# Patient Record
Sex: Female | Born: 1945 | Race: White | Hispanic: No | Marital: Married | Smoking: Never smoker
Health system: Southern US, Community
[De-identification: ages and names within clinical notes are randomized; demographics above are authoritative.]

## PROBLEM LIST (undated history)

## (undated) DIAGNOSIS — Z5189 Encounter for other specified aftercare: Secondary | ICD-10-CM

## (undated) DIAGNOSIS — F419 Anxiety disorder, unspecified: Secondary | ICD-10-CM

## (undated) DIAGNOSIS — R7302 Impaired glucose tolerance (oral): Secondary | ICD-10-CM

## (undated) DIAGNOSIS — I10 Essential (primary) hypertension: Secondary | ICD-10-CM

## (undated) DIAGNOSIS — M8580 Other specified disorders of bone density and structure, unspecified site: Secondary | ICD-10-CM

## (undated) DIAGNOSIS — M25461 Effusion, right knee: Secondary | ICD-10-CM

## (undated) DIAGNOSIS — S83209A Unspecified tear of unspecified meniscus, current injury, unspecified knee, initial encounter: Secondary | ICD-10-CM

## (undated) DIAGNOSIS — F32A Depression, unspecified: Secondary | ICD-10-CM

## (undated) DIAGNOSIS — T7840XA Allergy, unspecified, initial encounter: Secondary | ICD-10-CM

## (undated) DIAGNOSIS — Z87898 Personal history of other specified conditions: Secondary | ICD-10-CM

## (undated) DIAGNOSIS — D126 Benign neoplasm of colon, unspecified: Secondary | ICD-10-CM

## (undated) DIAGNOSIS — IMO0002 Reserved for concepts with insufficient information to code with codable children: Secondary | ICD-10-CM

## (undated) DIAGNOSIS — Z8719 Personal history of other diseases of the digestive system: Secondary | ICD-10-CM

## (undated) DIAGNOSIS — M1990 Unspecified osteoarthritis, unspecified site: Secondary | ICD-10-CM

## (undated) DIAGNOSIS — K219 Gastro-esophageal reflux disease without esophagitis: Secondary | ICD-10-CM

## (undated) DIAGNOSIS — E785 Hyperlipidemia, unspecified: Secondary | ICD-10-CM

## (undated) DIAGNOSIS — E119 Type 2 diabetes mellitus without complications: Secondary | ICD-10-CM

## (undated) DIAGNOSIS — M810 Age-related osteoporosis without current pathological fracture: Secondary | ICD-10-CM

## (undated) DIAGNOSIS — K760 Fatty (change of) liver, not elsewhere classified: Secondary | ICD-10-CM

## (undated) DIAGNOSIS — M19079 Primary osteoarthritis, unspecified ankle and foot: Secondary | ICD-10-CM

## (undated) DIAGNOSIS — M5030 Other cervical disc degeneration, unspecified cervical region: Secondary | ICD-10-CM

## (undated) DIAGNOSIS — F329 Major depressive disorder, single episode, unspecified: Secondary | ICD-10-CM

## (undated) DIAGNOSIS — K589 Irritable bowel syndrome without diarrhea: Secondary | ICD-10-CM

## (undated) DIAGNOSIS — D649 Anemia, unspecified: Secondary | ICD-10-CM

## (undated) HISTORY — DX: Other specified disorders of bone density and structure, unspecified site: M85.80

## (undated) HISTORY — DX: Reserved for concepts with insufficient information to code with codable children: IMO0002

## (undated) HISTORY — DX: Impaired glucose tolerance (oral): R73.02

## (undated) HISTORY — DX: Encounter for other specified aftercare: Z51.89

## (undated) HISTORY — PX: HYSTEROSCOPY WITH D & C: SHX1775

## (undated) HISTORY — PX: COLONOSCOPY: SHX5424

## (undated) HISTORY — DX: Depression, unspecified: F32.A

## (undated) HISTORY — DX: Age-related osteoporosis without current pathological fracture: M81.0

## (undated) HISTORY — DX: Hyperlipidemia, unspecified: E78.5

## (undated) HISTORY — DX: Allergy, unspecified, initial encounter: T78.40XA

## (undated) HISTORY — DX: Major depressive disorder, single episode, unspecified: F32.9

## (undated) HISTORY — DX: Essential (primary) hypertension: I10

## (undated) HISTORY — PX: OTHER SURGICAL HISTORY: SHX169

## (undated) HISTORY — DX: Benign neoplasm of colon, unspecified: D12.6

## (undated) HISTORY — DX: Gastro-esophageal reflux disease without esophagitis: K21.9

## (undated) HISTORY — DX: Anemia, unspecified: D64.9

## (undated) HISTORY — DX: Unspecified osteoarthritis, unspecified site: M19.90

---

## 1966-10-11 DIAGNOSIS — Z5189 Encounter for other specified aftercare: Secondary | ICD-10-CM

## 1966-10-11 HISTORY — DX: Encounter for other specified aftercare: Z51.89

## 1986-10-11 HISTORY — PX: TUBAL LIGATION: SHX77

## 1996-12-04 ENCOUNTER — Encounter: Payer: Self-pay | Admitting: Gastroenterology

## 1998-09-25 ENCOUNTER — Other Ambulatory Visit: Admission: RE | Admit: 1998-09-25 | Discharge: 1998-09-25 | Payer: Self-pay | Admitting: Gynecology

## 1999-01-29 ENCOUNTER — Other Ambulatory Visit: Admission: RE | Admit: 1999-01-29 | Discharge: 1999-01-29 | Payer: Self-pay | Admitting: Gynecology

## 2000-02-04 ENCOUNTER — Other Ambulatory Visit: Admission: RE | Admit: 2000-02-04 | Discharge: 2000-02-04 | Payer: Self-pay | Admitting: Gynecology

## 2001-02-16 ENCOUNTER — Other Ambulatory Visit: Admission: RE | Admit: 2001-02-16 | Discharge: 2001-02-16 | Payer: Self-pay | Admitting: Gynecology

## 2001-10-31 ENCOUNTER — Encounter: Admission: RE | Admit: 2001-10-31 | Discharge: 2001-11-17 | Payer: Self-pay | Admitting: Family Medicine

## 2002-02-21 ENCOUNTER — Other Ambulatory Visit: Admission: RE | Admit: 2002-02-21 | Discharge: 2002-02-21 | Payer: Self-pay | Admitting: Family Medicine

## 2002-12-13 ENCOUNTER — Other Ambulatory Visit: Admission: RE | Admit: 2002-12-13 | Discharge: 2002-12-13 | Payer: Self-pay | Admitting: Obstetrics and Gynecology

## 2003-02-22 ENCOUNTER — Encounter: Payer: Self-pay | Admitting: Family Medicine

## 2003-02-22 ENCOUNTER — Ambulatory Visit (HOSPITAL_COMMUNITY): Admission: RE | Admit: 2003-02-22 | Discharge: 2003-02-22 | Payer: Self-pay | Admitting: Family Medicine

## 2003-12-03 ENCOUNTER — Other Ambulatory Visit: Admission: RE | Admit: 2003-12-03 | Discharge: 2003-12-03 | Payer: Self-pay | Admitting: Gynecology

## 2004-12-17 ENCOUNTER — Other Ambulatory Visit: Admission: RE | Admit: 2004-12-17 | Discharge: 2004-12-17 | Payer: Self-pay | Admitting: Family Medicine

## 2005-07-05 ENCOUNTER — Ambulatory Visit: Payer: Self-pay | Admitting: Gastroenterology

## 2005-07-14 ENCOUNTER — Ambulatory Visit: Payer: Self-pay | Admitting: Gastroenterology

## 2006-02-03 ENCOUNTER — Other Ambulatory Visit: Admission: RE | Admit: 2006-02-03 | Discharge: 2006-02-03 | Payer: Self-pay | Admitting: Family Medicine

## 2006-10-05 ENCOUNTER — Ambulatory Visit: Payer: Self-pay | Admitting: Cardiology

## 2006-10-05 ENCOUNTER — Ambulatory Visit: Payer: Self-pay | Admitting: Gastroenterology

## 2006-10-11 HISTORY — PX: KNEE ARTHROSCOPY: SHX127

## 2006-10-26 ENCOUNTER — Ambulatory Visit: Payer: Self-pay | Admitting: Gastroenterology

## 2006-10-27 ENCOUNTER — Ambulatory Visit: Payer: Self-pay | Admitting: Internal Medicine

## 2006-10-27 LAB — CONVERTED CEMR LAB
BUN: 9 mg/dL (ref 6–23)
Basophils Absolute: 0 10*3/uL (ref 0.0–0.1)
Basophils Relative: 0.8 % (ref 0.0–1.0)
CO2: 26 meq/L (ref 19–32)
Calcium: 10.4 mg/dL (ref 8.4–10.5)
Chloride: 107 meq/L (ref 96–112)
Cholesterol: 229 mg/dL (ref 0–200)
Creatinine, Ser: 1 mg/dL (ref 0.4–1.2)
Cyclic Citrullin Peptide Ab: 3 units (ref <20)
Direct LDL: 143.4 mg/dL
Eosinophils Relative: 1 % (ref 0.0–5.0)
GFR calc Af Amer: 73 mL/min
GFR calc non Af Amer: 60 mL/min
Glucose, Bld: 164 mg/dL — ABNORMAL HIGH (ref 70–99)
HCT: 44.2 % (ref 36.0–46.0)
HDL: 53.2 mg/dL (ref >39.0)
Hemoglobin: 14.7 g/dL (ref 12.0–15.0)
Lymphocytes Relative: 32.3 % (ref 12.0–46.0)
MCHC: 33.3 g/dL (ref 30.0–36.0)
MCV: 88.6 fL (ref 78.0–100.0)
Monocytes Absolute: 0.4 10*3/uL (ref 0.2–0.7)
Monocytes Relative: 7.5 % (ref 3.0–11.0)
Neutro Abs: 3.4 10*3/uL (ref 1.4–7.7)
Neutrophils Relative %: 58.4 % (ref 43.0–77.0)
Platelets: 250 10*3/uL (ref 150–400)
Potassium: 4.2 meq/L (ref 3.5–5.1)
RBC: 4.98 M/uL (ref 3.87–5.11)
RDW: 11.9 % (ref 11.5–14.6)
Sodium: 144 meq/L (ref 135–145)
TSH: 2.62 microintl units/mL (ref 0.35–5.50)
Total CHOL/HDL Ratio: 4.3
Triglycerides: 178 mg/dL — ABNORMAL HIGH (ref 0–149)
VLDL: 36 mg/dL (ref 0–40)
WBC: 5.8 10*3/uL (ref 4.5–10.5)

## 2006-11-08 ENCOUNTER — Encounter: Payer: Self-pay | Admitting: Internal Medicine

## 2006-11-08 ENCOUNTER — Ambulatory Visit: Payer: Self-pay

## 2006-12-08 ENCOUNTER — Encounter: Payer: Self-pay | Admitting: Internal Medicine

## 2006-12-10 LAB — CONVERTED CEMR LAB: Pap Smear: NORMAL

## 2006-12-21 ENCOUNTER — Encounter: Payer: Self-pay | Admitting: Internal Medicine

## 2006-12-21 ENCOUNTER — Ambulatory Visit: Payer: Self-pay | Admitting: Internal Medicine

## 2006-12-21 ENCOUNTER — Other Ambulatory Visit: Admission: RE | Admit: 2006-12-21 | Discharge: 2006-12-21 | Payer: Self-pay | Admitting: Internal Medicine

## 2006-12-21 LAB — CONVERTED CEMR LAB
BUN: 10 mg/dL (ref 6–23)
CO2: 32 meq/L (ref 19–32)
Calcium Ionized: 1.42 mmol/L — ABNORMAL HIGH (ref 1.12–1.32)
Calcium, Total (PTH): 10.5 mg/dL (ref 8.4–10.5)
Calcium: 10.5 mg/dL (ref 8.4–10.5)
Chloride: 107 meq/L (ref 96–112)
Creatinine, Ser: 0.9 mg/dL (ref 0.4–1.2)
GFR calc Af Amer: 82 mL/min
GFR calc non Af Amer: 68 mL/min
Glucose, Bld: 95 mg/dL (ref 70–99)
Hgb A1c MFr Bld: 5.8 % (ref 4.6–6.0)
PTH: 22.9 pg/mL (ref 14.0–72.0)
Potassium: 6.1 meq/L (ref 3.5–5.1)
Sodium: 148 meq/L — ABNORMAL HIGH (ref 135–145)

## 2006-12-22 LAB — CONVERTED CEMR LAB: Pap Smear: NORMAL

## 2006-12-23 ENCOUNTER — Ambulatory Visit: Payer: Self-pay | Admitting: Internal Medicine

## 2006-12-23 LAB — CONVERTED CEMR LAB
Potassium: 5.7 meq/L — ABNORMAL HIGH (ref 3.5–5.1)
Sodium: 148 meq/L — ABNORMAL HIGH (ref 135–145)

## 2007-01-13 ENCOUNTER — Ambulatory Visit: Payer: Self-pay | Admitting: Internal Medicine

## 2007-01-13 LAB — CONVERTED CEMR LAB
Aldosterone, Serum: 2
BUN: 17 mg/dL (ref 6–23)
CO2: 29 meq/L (ref 19–32)
Calcium: 10.1 mg/dL (ref 8.4–10.5)
Chloride: 109 meq/L (ref 96–112)
Creatinine, Ser: 0.7 mg/dL (ref 0.4–1.2)
GFR calc Af Amer: 110 mL/min
GFR calc non Af Amer: 91 mL/min
Glucose, Bld: 92 mg/dL (ref 70–99)
Potassium Urine: 15 mmol/L
Potassium: 4.2 meq/L (ref 3.5–5.1)
Sodium, Ur: 113 meq/L
Sodium: 145 meq/L (ref 135–145)

## 2007-04-21 ENCOUNTER — Ambulatory Visit: Payer: Self-pay | Admitting: Family Medicine

## 2007-05-25 DIAGNOSIS — F419 Anxiety disorder, unspecified: Secondary | ICD-10-CM

## 2007-05-25 DIAGNOSIS — M1990 Unspecified osteoarthritis, unspecified site: Secondary | ICD-10-CM | POA: Insufficient documentation

## 2007-05-25 DIAGNOSIS — Z860100 Personal history of colon polyps, unspecified: Secondary | ICD-10-CM | POA: Insufficient documentation

## 2007-05-25 DIAGNOSIS — I10 Essential (primary) hypertension: Secondary | ICD-10-CM | POA: Insufficient documentation

## 2007-05-25 DIAGNOSIS — K219 Gastro-esophageal reflux disease without esophagitis: Secondary | ICD-10-CM | POA: Insufficient documentation

## 2007-05-25 DIAGNOSIS — D649 Anemia, unspecified: Secondary | ICD-10-CM | POA: Insufficient documentation

## 2007-05-25 DIAGNOSIS — F329 Major depressive disorder, single episode, unspecified: Secondary | ICD-10-CM

## 2007-05-25 DIAGNOSIS — F32A Depression, unspecified: Secondary | ICD-10-CM | POA: Insufficient documentation

## 2007-05-25 DIAGNOSIS — M810 Age-related osteoporosis without current pathological fracture: Secondary | ICD-10-CM | POA: Insufficient documentation

## 2007-08-30 ENCOUNTER — Encounter: Payer: Self-pay | Admitting: Internal Medicine

## 2007-10-17 ENCOUNTER — Ambulatory Visit: Payer: Self-pay | Admitting: Internal Medicine

## 2007-11-06 ENCOUNTER — Encounter: Payer: Self-pay | Admitting: Internal Medicine

## 2007-11-16 ENCOUNTER — Encounter: Payer: Self-pay | Admitting: Internal Medicine

## 2007-12-22 LAB — CONVERTED CEMR LAB: Pap Smear: NORMAL

## 2008-01-04 ENCOUNTER — Ambulatory Visit: Payer: Self-pay | Admitting: Gastroenterology

## 2008-01-10 ENCOUNTER — Encounter: Payer: Self-pay | Admitting: Internal Medicine

## 2008-01-10 ENCOUNTER — Encounter: Payer: Self-pay | Admitting: Gastroenterology

## 2008-01-10 ENCOUNTER — Ambulatory Visit: Payer: Self-pay | Admitting: Gastroenterology

## 2008-01-10 LAB — HM COLONOSCOPY: HM Colonoscopy: NORMAL

## 2008-01-17 ENCOUNTER — Ambulatory Visit: Payer: Self-pay | Admitting: Internal Medicine

## 2008-01-17 LAB — CONVERTED CEMR LAB
Bilirubin Urine: NEGATIVE
Blood in Urine, dipstick: NEGATIVE
Glucose, Urine, Semiquant: NEGATIVE
Ketones, urine, test strip: NEGATIVE
Nitrite: NEGATIVE
Protein, U semiquant: NEGATIVE
Specific Gravity, Urine: <1.005
Urobilinogen, UA: 0.2
WBC Urine, dipstick: NEGATIVE
pH: 5.5

## 2008-01-18 ENCOUNTER — Encounter: Payer: Self-pay | Admitting: Internal Medicine

## 2008-01-22 ENCOUNTER — Telehealth: Payer: Self-pay | Admitting: *Deleted

## 2008-01-30 ENCOUNTER — Ambulatory Visit: Payer: Self-pay | Admitting: Internal Medicine

## 2008-01-30 ENCOUNTER — Other Ambulatory Visit: Admission: RE | Admit: 2008-01-30 | Discharge: 2008-01-30 | Payer: Self-pay | Admitting: Internal Medicine

## 2008-01-30 ENCOUNTER — Encounter: Payer: Self-pay | Admitting: Internal Medicine

## 2008-01-31 LAB — CONVERTED CEMR LAB
ALT: 31 units/L (ref 0–35)
AST: 30 units/L (ref 0–37)
Albumin: 4.1 g/dL (ref 3.5–5.2)
Alkaline Phosphatase: 45 units/L (ref 39–117)
BUN: 13 mg/dL (ref 6–23)
Basophils Absolute: 0 10*3/uL (ref 0.0–0.1)
Basophils Relative: 0.6 % (ref 0.0–1.0)
Bilirubin, Direct: 0.1 mg/dL (ref 0.0–0.3)
CO2: 27 meq/L (ref 19–32)
Calcium: 10.1 mg/dL (ref 8.4–10.5)
Chloride: 103 meq/L (ref 96–112)
Cholesterol: 255 mg/dL (ref 0–200)
Creatinine, Ser: 0.8 mg/dL (ref 0.4–1.2)
Direct LDL: 170.5 mg/dL
Eosinophils Absolute: 0.1 10*3/uL (ref 0.0–0.7)
Eosinophils Relative: 1.7 % (ref 0.0–5.0)
GFR calc Af Amer: 94 mL/min
GFR calc non Af Amer: 78 mL/min
Glucose, Bld: 89 mg/dL (ref 70–99)
HCT: 39.1 % (ref 36.0–46.0)
HDL: 49.7 mg/dL (ref >39.0)
Hemoglobin: 13.4 g/dL (ref 12.0–15.0)
Lymphocytes Relative: 43.1 % (ref 12.0–46.0)
MCHC: 34.3 g/dL (ref 30.0–36.0)
MCV: 85 fL (ref 78.0–100.0)
Monocytes Absolute: 0.3 10*3/uL (ref 0.1–1.0)
Monocytes Relative: 7.4 % (ref 3.0–12.0)
Neutro Abs: 2.2 10*3/uL (ref 1.4–7.7)
Neutrophils Relative %: 47.2 % (ref 43.0–77.0)
Platelets: 286 10*3/uL (ref 150–400)
Potassium: 3.6 meq/L (ref 3.5–5.1)
RBC: 4.6 M/uL (ref 3.87–5.11)
RDW: 11.4 % — ABNORMAL LOW (ref 11.5–14.6)
Sodium: 141 meq/L (ref 135–145)
TSH: 2.59 microintl units/mL (ref 0.35–5.50)
Total Bilirubin: 0.8 mg/dL (ref 0.3–1.2)
Total CHOL/HDL Ratio: 5.1
Total Protein: 7.2 g/dL (ref 6.0–8.3)
Triglycerides: 136 mg/dL (ref 0–149)
VLDL: 27 mg/dL (ref 0–40)
WBC: 4.6 10*3/uL (ref 4.5–10.5)

## 2008-02-12 ENCOUNTER — Telehealth (INDEPENDENT_AMBULATORY_CARE_PROVIDER_SITE_OTHER): Payer: Self-pay | Admitting: *Deleted

## 2008-03-07 ENCOUNTER — Encounter: Payer: Self-pay | Admitting: Internal Medicine

## 2008-05-07 ENCOUNTER — Ambulatory Visit: Payer: Self-pay | Admitting: Internal Medicine

## 2008-05-07 LAB — CONVERTED CEMR LAB
Cholesterol: 222 mg/dL (ref 0–200)
Direct LDL: 146.9 mg/dL
HDL: 47.7 mg/dL (ref >39.0)
Total CHOL/HDL Ratio: 4.7
Triglycerides: 125 mg/dL (ref 0–149)
VLDL: 25 mg/dL (ref 0–40)

## 2008-05-14 ENCOUNTER — Ambulatory Visit: Payer: Self-pay | Admitting: Internal Medicine

## 2008-05-14 DIAGNOSIS — E785 Hyperlipidemia, unspecified: Secondary | ICD-10-CM | POA: Insufficient documentation

## 2008-06-24 ENCOUNTER — Inpatient Hospital Stay (HOSPITAL_COMMUNITY): Admission: RE | Admit: 2008-06-24 | Discharge: 2008-06-27 | Payer: Self-pay | Admitting: Orthopedic Surgery

## 2008-06-24 HISTORY — PX: TOTAL KNEE ARTHROPLASTY: SHX125

## 2008-07-15 ENCOUNTER — Encounter: Admission: RE | Admit: 2008-07-15 | Discharge: 2008-09-04 | Payer: Self-pay | Admitting: Orthopedic Surgery

## 2008-08-16 ENCOUNTER — Ambulatory Visit: Payer: Self-pay | Admitting: Internal Medicine

## 2008-08-19 ENCOUNTER — Telehealth: Payer: Self-pay | Admitting: Internal Medicine

## 2008-08-19 LAB — CONVERTED CEMR LAB
BUN: 8 mg/dL (ref 6–23)
Basophils Absolute: 0 10*3/uL (ref 0.0–0.1)
Basophils Relative: 0.7 % (ref 0.0–3.0)
CO2: 29 meq/L (ref 19–32)
Calcium: 10 mg/dL (ref 8.4–10.5)
Chloride: 108 meq/L (ref 96–112)
Cholesterol: 209 mg/dL (ref 0–200)
Creatinine, Ser: 0.7 mg/dL (ref 0.4–1.2)
Direct LDL: 135.4 mg/dL
Eosinophils Absolute: 0.1 10*3/uL (ref 0.0–0.7)
Eosinophils Relative: 2 % (ref 0.0–5.0)
GFR calc Af Amer: 109 mL/min
GFR calc non Af Amer: 90 mL/min
Glucose, Bld: 104 mg/dL — ABNORMAL HIGH (ref 70–99)
HCT: 35.1 % — ABNORMAL LOW (ref 36.0–46.0)
HDL: 41.4 mg/dL (ref >39.0)
Hemoglobin: 11.9 g/dL — ABNORMAL LOW (ref 12.0–15.0)
Lymphocytes Relative: 43.2 % (ref 12.0–46.0)
MCHC: 34 g/dL (ref 30.0–36.0)
MCV: 78.6 fL (ref 78.0–100.0)
Monocytes Absolute: 0.4 10*3/uL (ref 0.1–1.0)
Monocytes Relative: 8.8 % (ref 3.0–12.0)
Neutro Abs: 1.9 10*3/uL (ref 1.4–7.7)
Neutrophils Relative %: 45.3 % (ref 43.0–77.0)
Platelets: 223 10*3/uL (ref 150–400)
Potassium: 4.1 meq/L (ref 3.5–5.1)
RBC: 4.47 M/uL (ref 3.87–5.11)
RDW: 14.6 % (ref 11.5–14.6)
Sodium: 145 meq/L (ref 135–145)
Total CHOL/HDL Ratio: 5
Triglycerides: 137 mg/dL (ref 0–149)
VLDL: 27 mg/dL (ref 0–40)
WBC: 4.3 10*3/uL — ABNORMAL LOW (ref 4.5–10.5)

## 2008-08-20 LAB — CONVERTED CEMR LAB: Vit D, 1,25-Dihydroxy: 28 — ABNORMAL LOW (ref 30–89)

## 2008-10-31 ENCOUNTER — Ambulatory Visit: Payer: Self-pay | Admitting: Internal Medicine

## 2008-10-31 DIAGNOSIS — J069 Acute upper respiratory infection, unspecified: Secondary | ICD-10-CM | POA: Insufficient documentation

## 2008-11-04 ENCOUNTER — Telehealth: Payer: Self-pay | Admitting: Internal Medicine

## 2008-11-08 ENCOUNTER — Ambulatory Visit: Payer: Self-pay | Admitting: *Deleted

## 2008-11-08 DIAGNOSIS — E739 Lactose intolerance, unspecified: Secondary | ICD-10-CM | POA: Insufficient documentation

## 2008-11-08 DIAGNOSIS — J40 Bronchitis, not specified as acute or chronic: Secondary | ICD-10-CM | POA: Insufficient documentation

## 2008-11-08 DIAGNOSIS — K589 Irritable bowel syndrome without diarrhea: Secondary | ICD-10-CM | POA: Insufficient documentation

## 2008-11-18 ENCOUNTER — Ambulatory Visit: Payer: Self-pay | Admitting: Radiology

## 2008-11-18 ENCOUNTER — Ambulatory Visit: Payer: Self-pay | Admitting: *Deleted

## 2008-11-18 ENCOUNTER — Ambulatory Visit (HOSPITAL_BASED_OUTPATIENT_CLINIC_OR_DEPARTMENT_OTHER): Admission: RE | Admit: 2008-11-18 | Discharge: 2008-11-18 | Payer: Self-pay | Admitting: *Deleted

## 2008-11-18 DIAGNOSIS — R059 Cough, unspecified: Secondary | ICD-10-CM | POA: Insufficient documentation

## 2008-11-18 DIAGNOSIS — J45909 Unspecified asthma, uncomplicated: Secondary | ICD-10-CM | POA: Insufficient documentation

## 2008-11-18 DIAGNOSIS — R05 Cough: Secondary | ICD-10-CM

## 2008-11-18 DIAGNOSIS — J309 Allergic rhinitis, unspecified: Secondary | ICD-10-CM | POA: Insufficient documentation

## 2008-11-25 ENCOUNTER — Ambulatory Visit: Payer: Self-pay | Admitting: *Deleted

## 2009-01-24 ENCOUNTER — Encounter: Payer: Self-pay | Admitting: Internal Medicine

## 2009-01-28 ENCOUNTER — Encounter: Payer: Self-pay | Admitting: Internal Medicine

## 2009-02-03 ENCOUNTER — Ambulatory Visit: Payer: Self-pay | Admitting: Internal Medicine

## 2009-02-18 ENCOUNTER — Encounter: Payer: Self-pay | Admitting: Internal Medicine

## 2009-02-18 ENCOUNTER — Ambulatory Visit: Payer: Self-pay | Admitting: Family Medicine

## 2009-02-24 ENCOUNTER — Ambulatory Visit: Payer: Self-pay | Admitting: Internal Medicine

## 2009-02-24 LAB — CONVERTED CEMR LAB
ALT: 19 units/L (ref 0–35)
AST: 24 units/L (ref 0–37)
BUN: 12 mg/dL (ref 6–23)
CO2: 31 meq/L (ref 19–32)
CRP, High Sensitivity: 6 — ABNORMAL HIGH (ref 0.00–5.00)
Calcium: 9.6 mg/dL (ref 8.4–10.5)
Chloride: 107 meq/L (ref 96–112)
Cholesterol: 213 mg/dL — ABNORMAL HIGH (ref 0–200)
Creatinine, Ser: 0.8 mg/dL (ref 0.4–1.2)
Direct LDL: 140.8 mg/dL
GFR calc non Af Amer: 77.29 mL/min (ref >60)
Glucose, Bld: 96 mg/dL (ref 70–99)
HDL: 50 mg/dL (ref >39.00)
Hgb A1c MFr Bld: 6.1 % (ref 4.6–6.5)
Potassium: 4.2 meq/L (ref 3.5–5.1)
Sodium: 142 meq/L (ref 135–145)
TSH: 2.5 microintl units/mL (ref 0.35–5.50)
Total CHOL/HDL Ratio: 4
Triglycerides: 91 mg/dL (ref 0.0–149.0)
VLDL: 18.2 mg/dL (ref 0.0–40.0)

## 2009-02-27 ENCOUNTER — Encounter: Payer: Self-pay | Admitting: Internal Medicine

## 2009-02-27 ENCOUNTER — Other Ambulatory Visit: Admission: RE | Admit: 2009-02-27 | Discharge: 2009-02-27 | Payer: Self-pay | Admitting: Internal Medicine

## 2009-02-27 ENCOUNTER — Ambulatory Visit: Payer: Self-pay | Admitting: Internal Medicine

## 2009-02-27 LAB — CONVERTED CEMR LAB
Cholesterol, target level: 200 mg/dL
HDL goal, serum: 40 mg/dL
LDL Goal: 130 mg/dL

## 2009-03-03 ENCOUNTER — Encounter: Payer: Self-pay | Admitting: Internal Medicine

## 2009-05-08 ENCOUNTER — Encounter: Payer: Self-pay | Admitting: Internal Medicine

## 2009-06-09 ENCOUNTER — Ambulatory Visit: Payer: Self-pay | Admitting: Internal Medicine

## 2009-08-18 ENCOUNTER — Telehealth: Payer: Self-pay | Admitting: Internal Medicine

## 2009-11-26 ENCOUNTER — Encounter: Payer: Self-pay | Admitting: Internal Medicine

## 2009-12-26 ENCOUNTER — Ambulatory Visit: Payer: Self-pay | Admitting: Internal Medicine

## 2009-12-26 ENCOUNTER — Other Ambulatory Visit: Admission: RE | Admit: 2009-12-26 | Discharge: 2009-12-26 | Payer: Self-pay | Admitting: Internal Medicine

## 2009-12-26 DIAGNOSIS — M25579 Pain in unspecified ankle and joints of unspecified foot: Secondary | ICD-10-CM | POA: Insufficient documentation

## 2009-12-26 LAB — CONVERTED CEMR LAB
ALT: 15 units/L (ref 0–35)
AST: 18 units/L (ref 0–37)
Albumin: 4.7 g/dL (ref 3.5–5.2)
Alkaline Phosphatase: 50 units/L (ref 39–117)
BUN: 13 mg/dL (ref 6–23)
Bilirubin Urine: NEGATIVE
Bilirubin, Direct: 0.1 mg/dL (ref 0.0–0.3)
CO2: 28 meq/L (ref 19–32)
Calcium: 9.7 mg/dL (ref 8.4–10.5)
Chloride: 104 meq/L (ref 96–112)
Creatinine, Ser: 0.91 mg/dL (ref 0.40–1.20)
Glucose, Bld: 87 mg/dL (ref 70–99)
HCT: 42.8 % (ref 36.0–46.0)
Hemoglobin, Urine: NEGATIVE
Hemoglobin: 14.6 g/dL (ref 12.0–15.0)
Hgb A1c MFr Bld: 6 % (ref 4.6–6.1)
Indirect Bilirubin: 0.5 mg/dL (ref 0.0–0.9)
Ketones, ur: NEGATIVE mg/dL
Leukocytes, UA: NEGATIVE
MCHC: 34.1 g/dL (ref 30.0–36.0)
MCV: 84.8 fL (ref 78.0–100.0)
Nitrite: NEGATIVE
Pap Smear: NEGATIVE
Platelets: 221 10*3/uL (ref 150–400)
Potassium: 4.1 meq/L (ref 3.5–5.3)
Protein, ur: NEGATIVE mg/dL
RBC: 5.05 M/uL (ref 3.87–5.11)
RDW: 12.9 % (ref 11.5–15.5)
Sodium: 143 meq/L (ref 135–145)
Specific Gravity, Urine: 1.004 — ABNORMAL LOW (ref 1.005–1.030)
TSH: 2.832 microintl units/mL (ref 0.350–4.500)
Total Bilirubin: 0.6 mg/dL (ref 0.3–1.2)
Total Protein: 7 g/dL (ref 6.0–8.3)
Trich, Wet Prep: NONE SEEN
Urine Glucose: NEGATIVE mg/dL
Urobilinogen, UA: 0.2 (ref 0.0–1.0)
WBC: 5.3 10*3/uL (ref 4.0–10.5)
Yeast Wet Prep HPF POC: NONE SEEN
pH: 7 (ref 5.0–8.0)

## 2009-12-26 LAB — HM PAP SMEAR

## 2009-12-27 ENCOUNTER — Encounter: Payer: Self-pay | Admitting: Internal Medicine

## 2009-12-29 ENCOUNTER — Telehealth: Payer: Self-pay | Admitting: Internal Medicine

## 2009-12-29 ENCOUNTER — Encounter: Payer: Self-pay | Admitting: Internal Medicine

## 2009-12-30 ENCOUNTER — Ambulatory Visit: Payer: Self-pay | Admitting: Internal Medicine

## 2009-12-30 LAB — CONVERTED CEMR LAB
Fecal Occult Blood: NEGATIVE
OCCULT 1: NEGATIVE
OCCULT 2: NEGATIVE
OCCULT 3: NEGATIVE
OCCULT 4: NEGATIVE
OCCULT 5: NEGATIVE

## 2009-12-31 ENCOUNTER — Encounter: Payer: Self-pay | Admitting: Internal Medicine

## 2010-01-01 ENCOUNTER — Encounter: Payer: Self-pay | Admitting: Internal Medicine

## 2010-01-02 ENCOUNTER — Encounter: Payer: Self-pay | Admitting: Internal Medicine

## 2010-01-20 ENCOUNTER — Encounter: Payer: Self-pay | Admitting: Internal Medicine

## 2010-01-21 ENCOUNTER — Encounter: Payer: Self-pay | Admitting: Internal Medicine

## 2010-01-23 ENCOUNTER — Encounter: Payer: Self-pay | Admitting: Internal Medicine

## 2010-02-16 ENCOUNTER — Telehealth: Payer: Self-pay | Admitting: Internal Medicine

## 2010-03-20 ENCOUNTER — Ambulatory Visit: Payer: Self-pay | Admitting: Internal Medicine

## 2010-03-20 LAB — CONVERTED CEMR LAB
ALT: 17 units/L (ref 0–35)
AST: 18 units/L (ref 0–37)
Cholesterol: 174 mg/dL (ref 0–200)
HDL: 68.5 mg/dL (ref >39.00)
LDL Cholesterol: 95 mg/dL (ref 0–99)
Total CHOL/HDL Ratio: 3
Triglycerides: 51 mg/dL (ref 0.0–149.0)
VLDL: 10.2 mg/dL (ref 0.0–40.0)

## 2010-03-27 ENCOUNTER — Encounter: Payer: Self-pay | Admitting: Internal Medicine

## 2010-03-27 DIAGNOSIS — R928 Other abnormal and inconclusive findings on diagnostic imaging of breast: Secondary | ICD-10-CM | POA: Insufficient documentation

## 2010-07-27 ENCOUNTER — Encounter: Payer: Self-pay | Admitting: Internal Medicine

## 2010-11-01 ENCOUNTER — Encounter: Payer: Self-pay | Admitting: Gastroenterology
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
## 2010-11-12 NOTE — Procedures (Signed)
Summary: colonoscopy  colonoscopy   Imported By: Kassie Mends 01/15/2008 16:10:46  _____________________________________________________________________  External Attachment:    Type:   Image     Comment:   colonoscopy
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
## 2010-11-12 NOTE — Assessment & Plan Note (Signed)
Summary: uti,fever/jls   Vital Signs:  Patient Profile:   65 Years Old Female Weight:      154 pounds Temp:     98.0 degrees F oral BP sitting:   130 / 90  (left arm) Cuff size:   regular                 Chief Complaint:  vaginal itiching, burning, urgency, and rt abd and back pain X 1 week.  History of Present Illness: Diannah Quinley had pan endoscopy  and colonoscopy last wednesday 1 week ago.     Feels feverish today   body aches no chills no documented fever .      Rlq discomfort    to back  ?   / if from back  problems   vaginal itching a few days.    rectum was injected for hemmorhoid. with saline during colonoscopy ,   stools returning to normal except some intermittent  brb  ? from hemorrhoid.  No recent antibiotic .  No cough nvd rash dysuria .    reviewof reports had no colon bx  bur rectal bx and then upper gi bx dx gastritis and esophageal stricture.        Current Allergies: ! FORTEO * ANTI-INFLAMMATORIES  Past Surgical History:    Tubal ligation    colonoscopy egd  2009   Social History:    Reviewed history from 05/25/2007 and no changes required:       Occupation: Production designer, theatre/television/film       Married       Never Smoked       Alcohol use-no       Drug use-no       Regular exercise-yes    Review of Systems  The patient denies weight loss, chest pain, prolonged cough, melena, hematochezia, hematuria, genital sores, muscle weakness, and difficulty walking.     Physical Exam  General:     Well-developed,well-nourished,in no acute distress; alert,appropriate and cooperative throughout examination Neck:     No deformities, masses, or tenderness noted. Lungs:     Normal respiratory effort, chest expands symmetrically. Lungs are clear to auscultation, no crackles or wheezes. Heart:     normal rate and regular rhythm.   Abdomen:     Bowel sounds positive,abdomen soft and non-tender without masses, organomegaly or hernias noted. points to rlq but no massess or  tenderness Rectal:     normal sphincter tone and external hemorrhoid(s).  no blood or stool or redness has what feel like an internal hemorrhoid but no other fluctuance of   ,mass Genitalia:     normal introitus and no external lesions.   no masses on palpation Pulses:     present le  Extremities:     no cce  Neurologic:     nonf ocal, intactalert & oriented X3 and gait normal.   Skin:     turgor normal, color normal, and no ecchymoses.   Cervical Nodes:     no anterior cervical adenopathy and no posterior cervical adenopathy.   Inguinal Nodes:     no R inguinal adenopathy and no L inguinal adenopathy.   Psych:     Oriented X3, normally interactive, and good eye contact.      Impression & Recommendations:  Problem # 1:  MYALGIA (ICD-729.1) no fever now ? viral prodrome  no colon bx done and no acute abdomen findings  .. and urine is clear    monitor anc dall ir  gets fever. Her updated medication list for this problem includes:    Tylenol Arthritis Pain 650 Mg Tbcr (Acetaminophen) .Marland Kitchen... 2 twice  to three x a day   Problem # 2:  ABDOMINAL PAIN OTHER SPECIFIED SITE (ICD-789.09) ? radiating from  back   ? if related to   above symptom   await urine culture call if increasing Her updated medication list for this problem includes:    Tylenol Arthritis Pain 650 Mg Tbcr (Acetaminophen) .Marland Kitchen... 2 twice  to three x a day   Problem # 3:  VAGINAL PRURITUS (ICD-698.1) can do empiric otc yeast   treatment    no obvious lsesion  Problem # 4:  HEMORRHOIDS (ICD-455.6) s/p injection per gi 1 wek ago no obvious infection  may need to call gi if persistent symptom .  Complete Medication List: 1)  Fish Oil 500 Mg Caps (Omega-3 fatty acids) .... Take 2)  Tylenol Arthritis Pain 650 Mg Tbcr (Acetaminophen) .... 2 twice  to three x a day 3)  Caltrate 600+d 600-400 Mg-unit Tabs (Calcium carbonate-vitamin d) .... Two times a day-tid 4)  Glucosamine-chondroitin 250-200 Mg Caps  (Glucosamine-chondroitin) .... Take 1 capsule by mouth two times a day 5)  Levsin 0.125 Mg Tabs (Hyoscyamine sulfate) .... As needed 6)  Senior Multivitamin Plus Tabs (Multiple vitamins-minerals) .... Once daily 7)  Vitamin D3 1000 Unit Tabs (Cholecalciferol) .... Once daily 8)  Lorazepam 0.5 Mg Tabs (Lorazepam) .... Two times a day prn  Other Orders: UA Dipstick w/o Micro (automated)  (81003)   Patient Instructions: 1)  call if fever   2)  we will call with urine culture results    ] Laboratory Results   Urine Tests    Routine Urinalysis   Color: yellow Appearance: Clear Glucose: negative   (Normal Range: Negative) Bilirubin: negative   (Normal Range: Negative) Ketone: negative   (Normal Range: Negative) Spec. Gravity: <1.005   (Normal Range: 1.003-1.035) Blood: negative   (Normal Range: Negative) pH: 5.5   (Normal Range: 5.0-8.0) Protein: negative   (Normal Range: Negative) Urobilinogen: 0.2   (Normal Range: 0-1) Nitrite: negative   (Normal Range: Negative) Leukocyte Esterace: negative   (Normal Range: Negative)    Comments: ...................................................................Milica Zimonjic  January 17, 2008 3:22 PM

## 2010-11-12 NOTE — Procedures (Signed)
Summary: Gastroenterology-EGD  Gastroenterology-EGD   Imported By: Lowry Ram CMA 12/26/2008 10:07:31  _____________________________________________________________________  External Attachment:    Type:   Image     Comment:   External Document

## 2010-11-12 NOTE — Procedures (Signed)
Summary: Gastroenterology- COLON  Gastroenterology- COLON   Imported By: Lowry Ram CMA 12/26/2008 10:06:25  _____________________________________________________________________  External Attachment:    Type:   Image     Comment:   External Document

## 2010-11-12 NOTE — Consult Note (Signed)
Summary: [facility]  [facility]   Imported By: Esmeralda Links D'jimraou 03/19/2008 11:19:05  _____________________________________________________________________  External Attachment:    Type:   Image     Comment:   External Document

## 2010-11-12 NOTE — Letter (Signed)
   [facility] [address] [place], [place]  [address]  [place] Phone: [phone]      January 02, 2010   [facility] [address] [place], [place] [address]  RE:  LAB RESULTS  Dear  Ms. Worthley,  The following is an interpretation of your most recent lab tests.  Please take note of any instructions provided or changes to medications that have resulted from your lab work.  Hemoccult Test for blood in stool:  negative          Sincerely Yours,    Dr. Thomos Lemons

## 2010-11-12 NOTE — Progress Notes (Signed)
Summary: returning [name]'s call  different #4-14  Phone Note Call from Patient Call back at Work Phone [phone]   Caller: patient vm traige Call For: Panosh  Summary of Call: returning [name]'s call  Initial call taken by: Roselle Locus,  January 22, 2008 12:30 PM  Follow-up for Phone Call        Left message to call back. Follow-up by: Romualdo Bolk, CMA,  January 22, 2008 5:05 PM  Additional Follow-up for Phone Call Additional follow up Details #1::        call on cell [phone] ..................................................................Marland KitchenRoselle Locus  January 23, 2008 8:15 AM    Additional Follow-up for Phone Call Additional follow up Details #2::    Left message to call back on cell. I called pt on her work number and said that her test was normal. Follow-up by: Romualdo Bolk, CMA,  January 23, 2008 10:59 AM
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
## 2010-11-12 NOTE — Assessment & Plan Note (Signed)
Summary: reck colestrol,and surgery clearance form/jls   Vital Signs:  Patient Profile:   65 Years Old Female Height:     59.75 inches Weight:      154 pounds Temp:     99.6 degrees F Pulse rate:   88 / minute BP sitting:   134 / 90  (left arm)  Vitals Entered By: Gladis Riffle, RN (May 14, 2008 3:56 PM)                 Chief Complaint:  review cholesterol and surgical clearance.  History of Present Illness: preop surgical clearance requested by dr Despina Hick  Current Problems:  HYPERLIPIDEMIA (ICD-272.4)---currently on no meds OSTEOPENIA (ICD-733.90)---taking otc suplements HYPERTENSION (ICD-401.9)---currently on no meds GERD (ICD-530.81)---doing well on kapidex  Past Medical History: Hemorrhoids Fainting Spells Ulcers - esophogus Hepatitis/Jaundice High Cholesterol Migraines UTI's Anemia-NOS Blood transfusion Colonic polyps, hx of Depression GERD Headache Hypertension Osteoarthritis Osteopenia Hyperlipidemia  Past Surgical History: Tubal ligation colonoscopy egd  2009  Social History: Occupation: Production designer, theatre/television/film Married Never Smoked Alcohol use-no Drug use-no Regular exercise-yes  Family History: Family History of Alcoholism/Addiction Family History of Arthritis Family History Breast cancer 1st degree relative <50 Family History of Colon CA 1st degree relative <60 Family History Diabetes 1st degree relative Family History Hypertension Family History Other cancer - pancreatic Family History of Stroke M 1st degree relative <50 Family History of Cardiovascular disorder  no other complaints in a complete ROS       Prior Medications Reviewed Using: Patient Recall  Updated Prior Medication List: FISH OIL 1200 MG  CAPS (OMEGA-3 FATTY ACIDS) 2 two times a day TYLENOL ARTHRITIS PAIN 650 MG TBCR (ACETAMINOPHEN) 2 twice  to three X a day CALTRATE 600+D 600-400 MG-UNIT  TABS (CALCIUM CARBONATE-VITAMIN D) two times a day-tid LEVSIN 0.125 MG  TABS (HYOSCYAMINE  SULFATE) one dailyas needed SENIOR MULTIVITAMIN PLUS   TABS (MULTIPLE VITAMINS-MINERALS) once daily LORAZEPAM 0.5 MG  TABS (LORAZEPAM) two times a day prn STOOL SOFTENER 100 MG  CAPS (DOCUSATE SODIUM) two times a day FIBER THERAPY 500 MG  TABS (METHYLCELLULOSE (LAXATIVE)) two times a day as needed KAPIDEX 60 MG  CPDR (DEXLANSOPRAZOLE) once daily  Current Allergies (reviewed today): ! FORTEO * ANTI-INFLAMMATORIES  Past Medical History:    Hemorrhoids    Fainting Spells    Ulcers - esophogus    Hepatitis/Jaundice    High Cholesterol    Migraines    UTI's    Anemia-NOS    Blood transfusion    Colonic polyps, hx of    Depression    GERD    Headache    Hypertension    Osteoarthritis    Osteopenia    Hyperlipidemia  Past Surgical History:    Tubal ligation    colonoscopy egd  2009    knee arthroscopy      Physical Exam  General:     Well-developed,well-nourished,in no acute distress; alert,appropriate and cooperative throughout examination Head:     normocephalic and atraumatic.   Eyes:     pupils equal and pupils round.   Ears:     R ear normal and L ear normal.   Neck:     No deformities, masses, or tenderness noted. Chest Wall:     No deformities, masses, or tenderness noted. Lungs:     Normal respiratory effort, chest expands symmetrically. Lungs are clear to auscultation, no crackles or wheezes. Heart:     Normal rate and regular rhythm. S1 and S2 normal without  gallop, murmur, click, rub or other extra sounds. Abdomen:     Bowel sounds positive,abdomen soft and non-tender without masses, organomegaly or hernias noted. Msk:     No deformity or scoliosis noted of thoracic or lumbar spine.   Pulses:     R radial normal and L radial normal.   Neurologic:     cranial nerves II-XII intact and gait normal.   Skin:     turgor normal and color normal.   Cervical Nodes:     no anterior cervical adenopathy and no posterior cervical adenopathy.   Psych:      normally interactive and good eye contact.      Impression & Recommendations:  Problem # 1:  PREOPERATIVE EXAMINATION (ICD-V72.84) she is cleared for surgery she understands risks of surgery and of anesthesia.  Orders: EKG w/ Interpretation (93000)---fax to dr Despina Hick  cc: alusio  Orders: EKG w/ Interpretation ([address])   Problem # 2:  HYPERLIPIDEMIA (ICD-272.4) currently on no medications. May reconsider at some time in the future. Labs Reviewed: Chol: 222 (05/07/2008)   HDL: 47.7 (05/07/2008)   LDL: DEL (05/07/2008)   TG: 125 (05/07/2008) SGOT: 30 (01/30/2008)   SGPT: 31 (01/30/2008)   Complete Medication List: 1)  Fish Oil 1200 Mg Caps (Omega-3 fatty acids) .... 2 two times a day 2)  Tylenol Arthritis Pain 650 Mg Tbcr (Acetaminophen) .... 2 twice  to three x a day 3)  Caltrate 600+d 600-400 Mg-unit Tabs (Calcium carbonate-vitamin d) .... Two times a day-tid 4)  Levsin 0.125 Mg Tabs (Hyoscyamine sulfate) .... One dailyas needed 5)  Senior Multivitamin Plus Tabs (Multiple vitamins-minerals) .... Once daily 6)  Lorazepam 0.5 Mg Tabs (Lorazepam) .... Two times a day prn 7)  Stool Softener 100 Mg Caps (Docusate sodium) .... Two times a day 8)  Fiber Therapy 500 Mg Tabs (Methylcellulose (laxative)) .... Two times a day as needed 9)  Kapidex 60 Mg Cpdr (Dexlansoprazole) .... Once daily    ]  EKG  Procedure date:  05/14/2008  Findings:      normal:     EKG  Procedure date:  05/14/2008  Findings:      normal:

## 2010-11-12 NOTE — Progress Notes (Signed)
Summary: labs for celiac sprue?  LMTCB 08/19/2009  Phone Note Call from Patient   Caller: Patient Call For: Dr. Cato Mulligan Summary of Call: Given Pt results. Pt is asking if a test for Celiac Sprue was ordered, and if not, can we add it? Will call pt back. Initial call taken by: Lynann Beaver CMA,  August 19, 2008 8:28 AM  Follow-up for Phone Call        we looke at other labs that would be indicative no need to do the serologies Follow-up by: Birdie Sons MD,  August 19, 2008 12:26 PM  Additional Follow-up for Phone Call Additional follow up Details #1::        LMTCB  Pt given Dr. Cato Mulligan recommendations, and she will pick up labs. Lynann Beaver [facility]  August 20, 2008 9:26 AM Additional Follow-up by: Lynann Beaver CMA,  August 19, 2008 1:54 PM

## 2010-11-12 NOTE — Progress Notes (Signed)
Summary: Estrace Vaginal Cream  Phone Note Refill Request Call back at Work Phone [phone] Message from:  Fax from Pharmacy on Feb 16, 2010 9:30 AM  Refills Requested: Medication #1:  ESTRACE VAGINAL CREAM   Brand Name Necessary? No   Supply Requested: 1 month   Last Refilled: 01/19/2010 ESTRACE VAGINAL CREAM   intravaginally  apply  1 appilcator  full 2 times a week qty 42.5   Method Requested: Electronic Next Appointment Scheduled: 03/27/2010 Initial call taken by: Glendell Docker CMA,  Feb 16, 2010 9:33 AM  Follow-up for Phone Call        patient returned call and  states she uses the rx for vaginal dryness and would like to know if Dr Artist Pais would be willing to provide refills for her Follow-up by: Glendell Docker CMA,  Feb 16, 2010 11:38 AM  Additional Follow-up for Phone Call Additional follow up Details #1::        what is the result of her breat biopsies? Additional Follow-up by: D. Thomos Lemons DO,  Feb 16, 2010 12:27 PM    Additional Follow-up for Phone Call Additional follow up Details #2::    call place pt patient at [phone], husband stated patient was at work and would have her return the call, phone number left for patient to call. Follow-up by: Glendell Docker CMA,  Feb 16, 2010 4:39 PM  Additional Follow-up for Phone Call Additional follow up Details #3:: Details for Additional Follow-up Action Taken: spoke with patient she has a follow up appt 03/27/2010, she was advised that to check with the pharmacy for rx refill and will follow up at appointment to discuss future refills. Dr Artist Pais has reviewed biopsy results Additional Follow-up by: Glendell Docker CMA,  Feb 18, 2010 8:48 AM  New/Updated Medications: ESTRACE 0.1 MG/GM CREA (ESTRADIOL) insert intravaginally  one applicator full  twice weekly Prescriptions: ESTRACE 0.1 MG/GM CREA (ESTRADIOL) insert intravaginally  one applicator full  twice weekly  #42.5 x 2   Entered by:   Glendell Docker CMA   Authorized by:   D.  Thomos Lemons DO   Signed by:   Glendell Docker CMA on 02/18/2010   Method used:   Electronically to        [facility] [phone]* (retail)       [address] [place], [place]  [address]       Ph: [phone] or [phone]       Fax: [phone]   RxID:   [id number]
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
## 2010-11-12 NOTE — Progress Notes (Signed)
Summary: Rx request for ongoing cough  Phone Note Call from Patient Call back at Home Phone [phone]   Caller: Patient Call For: Birdie Sons MD Summary of Call: OV last week Thursday, C/O headache, sore throat, congestion and cough with wheezing.  Pt was given samples of Mucinex DM and was instructed to call on Monday if not improved.  Pt still wheezing and coughing, feels terrible, low grade fever.  Pt BP this weekend was 150/120 at one point, yesterday 148/116, today 148/98. Pt requesting something for cough and wheezing, inhaller? [facility] in [place] [phone] Initial call taken by: Sid Falcon LPN,  November 04, 2008 10:47 AM  Follow-up for Phone Call        hycodan 1-2 tsp two times a day as needed cough 120 cc/0 refill Follow-up by: Birdie Sons MD,  November 04, 2008 11:52 AM  Additional Follow-up for Phone Call Additional follow up Details #1::        Rx called in, Pt informed  Additional Follow-up by: Sid Falcon LPN,  November 04, 2008 12:29 PM

## 2010-11-12 NOTE — Progress Notes (Signed)
Summary: wouild like referral to arthritis specialist    Phone Note Call from Patient Call back at [phone]   Caller: pt vm triage Call For: Swords Summary of Call: was in for cpx last week.  Has shrunk 1".  Is having back aches and would like a referral to arthritis specialist  Initial call taken by: Roselle Locus,  Feb 12, 2008 1:28 PM  Follow-up for Phone Call        have her see pmr (kirsteins or Ramos) Follow-up by: Birdie Sons MD,  Feb 12, 2008 3:26 PM  Additional Follow-up for Phone Call Additional follow up Details #1::        Pt called back, wondering what happened to referral?  Informed of referral to Pain Management and Rehabilitation Specialist and that [facility] will be in touch with her re: her appt. Pt is requesting appt with Ramos if possible.  Pt contact number is [phone] Additional Follow-up by: Sid Falcon LPN,  Feb 15, 2955 10:27 AM    Additional Follow-up for Phone Call Additional follow up Details #2::    I notified patient by voicemail of the pain clinic policy. Follow-up by: Barnie Mort,  Feb 15, 2008 11:13 AM  Additional Follow-up for Phone Call Additional follow up Details #3:: Details for Additional Follow-up Action Taken: 05/22 @ 2:00 with Dr. Ethelene Hal left message Additional Follow-up by: Florentina Addison,  Feb 15, 2008 12:41 PM
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
## 2010-11-12 NOTE — Miscellaneous (Signed)
Summary: BONE DENSITY  Clinical Lists Changes  Orders: Added new Test order of T-Bone Densitometry (77080) - Signed Added new Test order of T-Lumbar Vertebral Assessment (77082) - Signed 
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
## 2010-11-12 NOTE — Letter (Signed)
Summary: egd  egd   Imported By: Kassie Mends 01/15/2008 16:09:40  _____________________________________________________________________  External Attachment:    Type:   Image     Comment:   egd

## 2010-11-12 NOTE — Assessment & Plan Note (Signed)
Summary: CPX/WILL FAST/CCM rsc bmp/njr   Vital Signs:  Patient Profile:   65 Years Old Female Height:     59.75 inches Weight:      154 pounds Pulse rate:   84 / minute BP sitting:   124 / 86  (left arm)  Vitals Entered By: Gladis Riffle, RN (January 30, 2008 10:55 AM)                 Chief Complaint:  cpx with pap, fasting--c/o RLQ pain and vaginal dryness--has increased need for levsin--had mammo, and endoscopy and colonoscopy.  History of Present Illness: cpx  considering knee replacement    Current Allergies (reviewed today): ! FORTEO * ANTI-INFLAMMATORIES  Past Medical History:    Reviewed history from 05/25/2007 and no changes required:       Hemorrhoids       Fainting Spells       Ulcers - esophogus       Hepatitis/Jaundice       High Cholesterol       Migraines       UTI's       Anemia-NOS       Blood transfusion       Colonic polyps, hx of       Depression       GERD       Headache       Hypertension       Osteoarthritis       Osteopenia  Past Surgical History:    Reviewed history from 01/17/2008 and no changes required:       Tubal ligation       colonoscopy egd  2009   Family History:    Reviewed history from 05/25/2007 and no changes required:       Family History of Alcoholism/Addiction       Family History of Arthritis       Family History Breast cancer 1st degree relative <50       Family History of Colon CA 1st degree relative <60       Family History Diabetes 1st degree relative       Family History Hypertension       Family History Other cancer - pancreatic       Family History of Stroke M 1st degree relative <50       Family History of Cardiovascular disorder  Social History:    Reviewed history from 05/25/2007 and no changes required:       Occupation: Production designer, theatre/television/film       Married       Never Smoked       Alcohol use-no       Drug use-no       Regular exercise-yes   Risk Factors:  Colonoscopy History:     Date of Last  Colonoscopy:  01/10/2008    Results:  colon polyp   PAP Smear History:     Date of Last PAP Smear:  12/10/2006    Results:  normal   Mammogram History:     Date of Last Mammogram:  12/08/2006    Results:  normal   Colonoscopy History:     Date of Last Colonoscopy:  10/11/2004    Results:  normal-pt's report    Review of Systems       no other complaints in a complete ROS    Physical Exam  Genitalia:     Pelvic Exam:        External:  normal female genitalia without lesions or masses        Vagina: normal without lesions or masses        Cervix: normal without lesions or masses        Adnexa: normal bimanual exam without masses or fullness        Uterus: normal by palpation        Pap smear: performed    Impression & Recommendations:  Problem # 1:  PREVENTIVE HEALTH CARE (ICD-V70.0)  Orders: Venipuncture (16109) TLB-Lipid Panel (80061-LIPID) TLB-BMP (Basic Metabolic Panel-BMET) (80048-METABOL) TLB-CBC Platelet - w/Differential (85025-CBCD) TLB-Hepatic/Liver Function Pnl (80076-HEPATIC) TLB-TSH (Thyroid Stimulating Hormone) (84443-TSH) UA Dipstick w/Micro (automated) (81001)   Complete Medication List: 1)  Fish Oil 1200 Mg Caps (Omega-3 fatty acids) .... 2 two times a day 2)  Tylenol Arthritis Pain 650 Mg Tbcr (Acetaminophen) .... 2 twice  to three x a day 3)  Caltrate 600+d 600-400 Mg-unit Tabs (Calcium carbonate-vitamin d) .... Two times a day-tid 4)  Glucosamine-msm 1500-500 Mg/75ml Liqd (Glucosamine hcl-msm) .... Two times a day 5)  Levsin 0.125 Mg Tabs (Hyoscyamine sulfate) .... One dailyas needed 6)  Senior Multivitamin Plus Tabs (Multiple vitamins-minerals) .... Once daily 7)  Lorazepam 0.5 Mg Tabs (Lorazepam) .... Two times a day prn 8)  Stool Softener 100 Mg Caps (Docusate sodium) .... Two times a day 9)  Fiber Therapy 500 Mg Tabs (Methylcellulose (laxative)) .... Two times a day 10)  Kapidex 60 Mg Cpdr (Dexlansoprazole) .... Once daily      Prescriptions: KAPIDEX 60 MG  CPDR (DEXLANSOPRAZOLE) once daily  #90 x 3   Entered by:   Gladis Riffle, RN   Authorized by:   Birdie Sons MD   Signed by:   Gladis Riffle, RN on 01/30/2008   Method used:   Print then Give to Patient   RxID:   [id number] LEVSIN 0.125 MG  TABS (HYOSCYAMINE SULFATE) one dailyas needed  #60 x 3   Entered by:   Gladis Riffle, RN   Authorized by:   Birdie Sons MD   Signed by:   Gladis Riffle, RN on 01/30/2008   Method used:   Print then Give to Patient   RxID:   [id number]  ]  Preventive Care Screening  Pap Smear:    Date:  12/10/2006    Next Due:  03/2007    Results:  normal   Colonoscopy:    Date:  10/11/2004    Next Due:  01/2013     Results:  normal-pt's report   Mammogram:    Date:  12/08/2006    Next Due:  12/2007     Results:  normal     Nuclear Exercise Stress Test Report  Procedure date:  11/08/2006  Findings:      No evidence of cardiac ischemia by nuclear imaging.  fair exercise capacity normal BP response no SXS  Physical Exam General Appearance: well developed, well nourished, no acute distress Eyes: conjunctiva and lids normal, PERRL, EOMI, fundi WNL Ears, Nose, Mouth, Throat: TM clear, nares clear, oral exam WNL Neck: supple, no lymphadenopathy, no thyromegaly, no JVD Respiratory: clear to auscultation and percussion, respiratory effort normal Cardiovascular: regular rate and rhythm, S1-S2, no murmur, rub or gallop, no bruits, peripheral pulses normal and symmetric, no cyanosis, clubbing, edema or varicosities Chest: no scars, masses, tenderness; no asymmetry, skin changes, nipple discharge   Gastrointestinal: soft, non-tender; no hepatosplenomegaly, masses; active bowel sounds all quadrants, ; no masses, tenderness, hemorrhoids  Genitourinary: no vaginal discharge,  lesions; no masses or tenderness Lymphatic: no cervical, axillary or inguinal adenopathy Musculoskeletal: gait normal, muscle tone and strength WNL, no  joint swelling, effusions, discoloration, crepitus  Skin: clear, good turgor, color WNL, no rashes, lesions, or ulcerations Neurologic: normal mental status, normal reflexes, normal strength, sensation, and motion Psychiatric: alert; oriented to person, place and time Other Exam:  Physical Exam General Appearance: well developed, well nourished, no acute distress Eyes: conjunctiva and lids normal, PERRL, EOMI, fundi WNL Ears, Nose, Mouth, Throat: TM clear, nares clear, oral exam WNL Neck: supple, no lymphadenopathy, no thyromegaly, no JVD Respiratory: clear to auscultation and percussion, respiratory effort normal Cardiovascular: regular rate and rhythm, S1-S2, no murmur, rub or gallop, no bruits, peripheral pulses normal and symmetric, no cyanosis, clubbing, edema or varicosities Chest: no scars, masses, tenderness; no asymmetry, skin changes, nipple discharge   Gastrointestinal: soft, non-tender; no hepatosplenomegaly, masses; active bowel sounds all quadrants, ; no masses, tenderness, hemorrhoids  Genitourinary: no vaginal discharge, lesions; no masses or tenderness Lymphatic: no cervical, axillary or inguinal adenopathy Musculoskeletal: gait normal, muscle tone and strength WNL, no joint swelling, effusions, discoloration, crepitus  Skin: clear, good turgor, color WNL, no rashes, lesions, or ulcerations Neurologic: normal mental status, normal reflexes, normal strength, sensation, and motion Psychiatric: alert; oriented to person, place and time Other Exam:
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
## 2010-11-12 NOTE — Progress Notes (Signed)
Summary: REFILL REQUEST  Phone Note Refill Request Message from:  Fax from Pharmacy on December 29, 2009 8:31 AM  Refills Requested: Medication #1:  TRIPLE SULFA VA VAG CRE SCHIN   Dosage confirmed as above?Dosage Confirmed   Brand Name Necessary? No   Supply Requested: 1 month K [facility]    Method Requested: Electronic Next Appointment Scheduled: 03-16-2010 810 LAB Initial call taken by: Roselle Locus,  December 29, 2009 8:31 AM  Follow-up for Phone Call        I suggest clindimycin vaginal supp.  see rx Follow-up by: D. Thomos Lemons DO,  December 29, 2009 11:56 AM  Additional Follow-up for Phone Call Additional follow up Details #1::        patient advised of the rx change Additional Follow-up by: Glendell Docker CMA,  December 29, 2009 1:35 PM    New/Updated Medications: CLEOCIN 100 MG SUPP (CLINDAMYCIN PHOSPHATE) insert one dose at bedtime x 3 days Prescriptions: CLEOCIN 100 MG SUPP (CLINDAMYCIN PHOSPHATE) insert one dose at bedtime x 3 days  #3 x 0   Entered and Authorized by:   D. Thomos Lemons DO   Signed by:   D. Thomos Lemons DO on 12/29/2009   Method used:   Electronically to        [phone]* (retail)       [address] [place], [place]  [address]       Ph: [phone] or [phone]       Fax: [phone]   RxID:   [id number]

## 2010-11-12 NOTE — Assessment & Plan Note (Signed)
Summary: reck/patient coming in fasting/jls   Vital Signs:  Patient Profile:   65 Years Old Female Height:     59.75 inches Weight:      149 pounds Temp:     99.1 degrees F Pulse rate:   72 / minute BP sitting:   138 / 82  (left arm)  Vitals Entered By: Gladis Riffle, RN (August 16, 2008 9:35 AM)                 Chief Complaint:  lab recheck, fasting--requests bmet, and Hb with cholesterol--daughter has celiac so wants test for that.  History of Present Illness:  Follow-Up Visit      This is a 65 year old woman who presents for Follow-up visit.  The patient denies chest pain, palpitations, dizziness, syncope, low blood sugar symptoms, high blood sugar symptoms, edema, SOB, DOE, PND, and orthopnea.  Since the last visit the patient notes no new problems or concerns.  The patient reports taking meds as prescribed.  When questioned about possible medication side effects, the patient notes none.      Updated Prior Medication List: FISH OIL 1200 MG  CAPS (OMEGA-3 FATTY ACIDS) 2 two times a day TYLENOL ARTHRITIS PAIN 650 MG TBCR (ACETAMINOPHEN) 2 twice  to three X a day CALTRATE 600+D 600-400 MG-UNIT  TABS (CALCIUM CARBONATE-VITAMIN D) two times a day-tid LEVSIN 0.125 MG  TABS (HYOSCYAMINE SULFATE) one dailyas needed SENIOR MULTIVITAMIN PLUS   TABS (MULTIPLE VITAMINS-MINERALS) once daily LORAZEPAM 0.5 MG  TABS (LORAZEPAM) two times a day prn STOOL SOFTENER 100 MG  CAPS (DOCUSATE SODIUM) two times a day FIBER THERAPY 500 MG  TABS (METHYLCELLULOSE (LAXATIVE)) two times a day as needed KAPIDEX 60 MG  CPDR (DEXLANSOPRAZOLE) once daily CO Q-10 120 MG CAPS (COENZYME Q10) once daily TRAMADOL HCL 50 MG TABS (TRAMADOL HCL) Take 1 tablet by mouth three times a day ROBAXIN 500 MG TABS (METHOCARBAMOL) Take 1 tablet by mouth three times a day OXYCODONE HCL 20 MG TABS (OXYCODONE HCL) every 6 hours as needed  from knee surgery  Current Allergies (reviewed today): ! FORTEO * ANTI-INFLAMMATORIES   Past Medical History:    Reviewed history from 05/14/2008 and no changes required:       Hemorrhoids       Fainting Spells       Ulcers - esophogus       Hepatitis/Jaundice       High Cholesterol       Migraines       UTI's       Anemia-NOS       Blood transfusion       Colonic polyps, hx of       Depression       GERD       Headache       Hypertension       Osteoarthritis       Osteopenia       Hyperlipidemia  Past Surgical History:    Tubal ligation    colonoscopy egd  2009    knee arthroscopy    Total knee replacement-left knee   Family History:    Reviewed history from 05/25/2007 and no changes required:       Family History of Alcoholism/Addiction       Family History of Arthritis       Family History Breast cancer 1st degree relative <50       Family History of Colon CA 1st degree relative <  60       Family History Diabetes 1st degree relative       Family History Hypertension       Family History Other cancer - pancreatic       Family History of Stroke M 1st degree relative <50       Family History of Cardiovascular disorder  Social History:    Reviewed history from 05/25/2007 and no changes required:       Occupation: Production designer, theatre/television/film       Married       Never Smoked       Alcohol use-no       Drug use-no       Regular exercise-yes     Physical Exam  General:     Well-developed,well-nourished,in no acute distress; alert,appropriate and cooperative throughout examination Head:     Normocephalic and atraumatic without obvious abnormalities. No apparent alopecia or balding. Eyes:     vision grossly intact and pupils round.   Neck:     No deformities, masses, or tenderness noted. Chest Wall:     No deformities, masses, or tenderness noted. Lungs:     Normal respiratory effort, chest expands symmetrically. Lungs are clear to auscultation, no crackles or wheezes. Heart:     Normal rate and regular rhythm. S1 and S2 normal without gallop, murmur, click, rub  or other extra sounds. Abdomen:     Bowel sounds positive,abdomen soft and non-tender without masses, organomegaly or hernias noted. Msk:     normal ROM and no joint tenderness.   Pulses:     R radial normal and L radial normal.   Extremities:     No clubbing, cyanosis, edema, or deformity noted  Neurologic:     cranial nerves II-XII intact and gait normal.   Skin:     turgor normal and color normal.   Psych:     memory intact for recent and remote and good eye contact.      Impression & Recommendations:  Problem # 1:  HYPERLIPIDEMIA (ICD-272.4) check labs Labs Reviewed: Chol: 222 (05/07/2008)   HDL: 47.7 (05/07/2008)   LDL: 146.9 (05/07/2008)   TG: 125 (05/07/2008) SGOT: 30 (01/30/2008)   SGPT: 31 (01/30/2008)  Orders: TLB-Lipid Panel (80061-LIPID)   Problem # 2:  MYALGIA (ICD-729.1) Assessment: Unchanged stable Her updated medication list for this problem includes:    Tylenol Arthritis Pain 650 Mg Tbcr (Acetaminophen) .Marland Kitchen... 2 twice  to three x a day    Tramadol Hcl 50 Mg Tabs (Tramadol hcl) .Marland Kitchen... Take 1 tablet by mouth three times a day    Robaxin 500 Mg Tabs (Methocarbamol) .Marland Kitchen... Take 1 tablet by mouth three times a day    Oxycodone Hcl 20 Mg Tabs (Oxycodone hcl) ..... Every 6 hours as needed  from knee surgery   Problem # 3:  HYPERTENSION (ICD-401.9) no meds BP today: 138/82 Prior BP: 134/90 (05/14/2008)  Labs Reviewed: Creat: 0.8 (01/30/2008) Chol: 222 (05/07/2008)   HDL: 47.7 (05/07/2008)   LDL: 146.9 (05/07/2008)   TG: 125 (05/07/2008)  Orders: TLB-BMP (Basic Metabolic Panel-BMET) (80048-METABOL)   Problem # 4:  PREVENTIVE HEALTH CARE (ICD-V70.0)  Orders: T-Assay of Vitamin D (16109-60454)   Complete Medication List: 1)  Fish Oil 1200 Mg Caps (Omega-3 fatty acids) .... 2 two times a day 2)  Tylenol Arthritis Pain 650 Mg Tbcr (Acetaminophen) .... 2 twice  to three x a day 3)  Caltrate 600+d 600-400 Mg-unit Tabs (Calcium carbonate-vitamin d) .... Two  times  a day-tid 4)  Levsin 0.125 Mg Tabs (Hyoscyamine sulfate) .... One dailyas needed 5)  Senior Multivitamin Plus Tabs (Multiple vitamins-minerals) .... Once daily 6)  Lorazepam 0.5 Mg Tabs (Lorazepam) .... Two times a day prn 7)  Stool Softener 100 Mg Caps (Docusate sodium) .... Two times a day 8)  Fiber Therapy 500 Mg Tabs (Methylcellulose (laxative)) .... Two times a day as needed 9)  Kapidex 60 Mg Cpdr (Dexlansoprazole) .... Once daily 10)  Co Q-10 120 Mg Caps (Coenzyme q10) .... Once daily 11)  Tramadol Hcl 50 Mg Tabs (Tramadol hcl) .... Take 1 tablet by mouth three times a day 12)  Robaxin 500 Mg Tabs (Methocarbamol) .... Take 1 tablet by mouth three times a day 13)  Oxycodone Hcl 20 Mg Tabs (Oxycodone hcl) .... Every 6 hours as needed  from knee surgery  Other Orders: Venipuncture (81191) TLB-CBC Platelet - w/Differential (85025-CBCD)    ]

## 2010-11-12 NOTE — Progress Notes (Signed)
Summary: REFILL HYOMAX  Phone Note Refill Request Message from:  Fax from Pharmacy on August 18, 2009 9:07 AM  Refills Requested: Medication #1:  HYOMAX SL -.125 MG SUB ARIS   Dosage confirmed as above?Dosage Confirmed   Brand Name Necessary? No   Supply Requested: 3 months   Last Refilled: 04/27/2006  Method Requested: Electronic Next Appointment Scheduled: NONE Initial call taken by: Roselle Locus,  August 18, 2009 9:08 AM  Follow-up for Phone Call        ok to refill x3 Follow-up by: D. Thomos Lemons DO,  August 18, 2009 1:27 PM  Additional Follow-up for Phone Call Additional follow up Details #1::        Rx sent to pharmacy  Additional Follow-up by: Glendell Docker CMA,  August 18, 2009 5:05 PM    Prescriptions: LEVSIN 0.125 MG  TABS (HYOSCYAMINE SULFATE) one tab every 8 hours as needed  #90 x 0   Entered by:   Glendell Docker CMA   Authorized by:   D. Thomos Lemons DO   Signed by:   Glendell Docker CMA on 08/18/2009   Method used:   Electronically to        [facility] [phone]* (retail)       [address] [place], [place]  [address]       Ph: [phone] or [phone]       Fax: [phone]   RxID:   [id number]

## 2010-11-12 NOTE — Assessment & Plan Note (Signed)
Summary: 3 MONTH ROV-CH   Vital Signs:  Patient profile:   65 year old female Weight:      151.50 pounds BMI:     29.94 Temp:     98.8 degrees F oral Pulse rate:   80 / minute Pulse rhythm:   regular Resp:     18 per minute BP sitting:   132 / 78  (right arm) Cuff size:   regular  Vitals Entered By: Glendell Docker CMA (June 09, 2009 8:10 AM)  Primary Care Provider:  Paulo Fruit MD  CC:  3 Month Follow up .  History of Present Illness: 3 Month Follow up disease management  Hypertension Follow-Up      This is a 65 year old woman who presents for Hypertension follow-up.  The patient denies lightheadedness, headaches, and edema.  The patient denies the following associated symptoms: chest pain.  Compliance with medications (by patient report) has been near 100%.  The patient reports that dietary compliance has been fair.    Hyperlipidemia - tolerating pravastatin.  She is struggling with chronic right ankle pain.  Worse with walking or other wt bearing exercises.  Received cortisone injection - only short term relief.  Allergies: 1)  ! 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 Hiatal hernia Esophageal stricture Chronic right ankle pain (DJD)  Family History: Family History of Alcoholism/Addiction Family History of Arthritis Family History Breast cancer 1st degree relative <50 Family History of Colon CA 1st degree relative <60 Family History Diabetes 1st degree relative Family History Hypertension Family History  Other cancer - pancreatic Family History of Stroke M 1st degree relative <50 Family History of Cardiovascular disorder       Social History: 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   Neck:  No deformities, masses, or tenderness noted. Lungs:  normal respiratory effort and normal breath sounds.   Heart:  normal rate, regular rhythm, and no gallop.   Msk:  right lower ext muscles atrophied.   Extremities:  No lower extremity edema  Neurologic:  cranial nerves II-XII intact and gait normal.     Impression & Recommendations:  Problem # 1:  HYPERTENSION, BORDERLINE (ICD-401.9) Well controlled.  Maintain current medication regimen.  Her updated medication list for this problem includes:    Hydrochlorothiazide 12.5 Mg Tabs (Hydrochlorothiazide) ..... One by mouth once daily  BP today: 132/78 Prior BP: 100/80 (02/27/2009)  Prior 10 Yr Risk Heart Disease: Not enough information (02/27/2009)  Labs Reviewed: K+: 4.2 (02/24/2009) Creat: : 0.8 (02/24/2009)   Chol: 213 (02/24/2009)   HDL: 50.00 (02/24/2009)   LDL: DEL (08/16/2008)   TG: 91.0 (02/24/2009)  Problem # 2:  HYPERLIPIDEMIA (ICD-272.4) No side effects.  LDL decreased to 106.  LFT's normal (05/08/2009)  Her updated medication list for this problem includes:    Pravastatin Sodium 40 Mg Tabs (Pravastatin sodium) ..... One by mouth qpm  Complete Medication List: 1)  Fish Oil 1200 Mg Caps (Omega-3 fatty acids) .... 2  capsules by mouth two times a day 2)  Tylenol Arthritis Pain 650 Mg Tbcr (Acetaminophen) .... 2 twice  to three x a day 3)  Levsin 0.125 Mg Tabs (Hyoscyamine sulfate) .... One tab every 8 hours as needed 4)  Senior Multivitamin Plus Tabs (Multiple vitamins-minerals) .... Once daily 5)  Fiber Therapy 500 Mg Tabs (Methylcellulose (laxative)) ....  Two times a day as needed 6)  Vitamin D3 2000 Unit Caps (Cholecalciferol) .... Take 1 capsule by mouth once a day 7)  Hydrochlorothiazide 12.5 Mg Tabs (Hydrochlorothiazide) .... One by mouth once daily 8)  Pravastatin Sodium 40 Mg Tabs (Pravastatin sodium) .... One by mouth qpm 9)  Omeprazole 20 Mg Tbec (Omeprazole) .... Take 1 tablet by mouth two times a day 10)  Stool Softener 100 Mg Caps (Docusate sodium) .... Take 1 tab by mouth at bedtime 11)  Tramadol Hcl 50 Mg Tabs (Tramadol hcl) .... One tablet by mouth every 4-6 hours as needed 12)  Acidophilus Caps (Lactobacillus) .... Take 1 capsule by mouth once a day  Patient Instructions: 1)  Please schedule a follow-up appointment in 6 months. Prescriptions: PRAVASTATIN SODIUM 40 MG TABS (PRAVASTATIN SODIUM) one by mouth qpm  #90 x 3   Entered and Authorized by:   D. Thomos Lemons DO   Signed by:   D. Thomos Lemons DO on 06/09/2009   Method used:   Print then Mail to Patient   RxID:   [id number] HYDROCHLOROTHIAZIDE 12.5 MG TABS (HYDROCHLOROTHIAZIDE) one by mouth once daily  #90 x 3   Entered and Authorized by:   D. Thomos Lemons DO   Signed by:   D. Thomos Lemons DO on 06/09/2009   Method used:   Print then Mail to Patient   RxID:   [id number]  7 Current Allergies: ! FORTEO * ANTI-INFLAMMATORIES

## 2010-11-12 NOTE — Letter (Signed)
     December 29, 2009   [facility] [address] [place], [place] [address]  RE:  LAB RESULTS  Dear  Ms. Frenz,  The following is an interpretation of your most recent lab tests.  Please take note of any instructions provided or changes to medications that have resulted from your lab work.  Pap Smear: normal - follow up in 2 years          Sincerely Yours,    Dr. Thomos Lemons

## 2010-11-12 NOTE — Letter (Signed)
   [place] at [facility] [address] [place], [place]  [address]  [place] Phone: [phone]      December 29, 2009   [facility] [address] [place], [place] [address]  RE:  LAB RESULTS  Dear  Ms. Mckenna,  The following is an interpretation of your most recent lab tests.  Please take note of any instructions provided or changes to medications that have resulted from your lab work.  ELECTROLYTES:  Good - no changes needed  KIDNEY FUNCTION TESTS:  Good - no changes needed  LIVER FUNCTION TESTS:  Good - no changes needed   THYROID STUDIES:  Thyroid studies normal TSH: 2.832     DIABETIC STUDIES:  Good - no changes needed Blood Glucose: 87   HgbA1C: 6.0     CBC:  Good - no changes needed       Sincerely Yours,    Dr. Thomos Lemons

## 2010-12-29 LAB — HM MAMMOGRAPHY

## 2010-12-30 ENCOUNTER — Encounter: Payer: Self-pay | Admitting: Internal Medicine

## 2011-01-03 ENCOUNTER — Encounter: Payer: Self-pay | Admitting: Internal Medicine

## 2011-01-03 ENCOUNTER — Other Ambulatory Visit: Payer: Self-pay | Admitting: Internal Medicine

## 2011-01-04 ENCOUNTER — Ambulatory Visit (INDEPENDENT_AMBULATORY_CARE_PROVIDER_SITE_OTHER): Payer: 59 | Admitting: Internal Medicine

## 2011-01-04 ENCOUNTER — Encounter: Payer: Self-pay | Admitting: Internal Medicine

## 2011-01-04 ENCOUNTER — Other Ambulatory Visit (HOSPITAL_COMMUNITY)
Admission: RE | Admit: 2011-01-04 | Discharge: 2011-01-04 | Disposition: A | Discharge status: Home / Self Care | Payer: 59 | Source: Ambulatory Visit | Attending: Internal Medicine | Admitting: Internal Medicine

## 2011-01-04 ENCOUNTER — Telehealth: Payer: Self-pay | Admitting: Internal Medicine

## 2011-01-04 ENCOUNTER — Ambulatory Visit (HOSPITAL_BASED_OUTPATIENT_CLINIC_OR_DEPARTMENT_OTHER)
Admission: RE | Admit: 2011-01-04 | Discharge: 2011-01-04 | Disposition: A | Discharge status: Home / Self Care | Payer: 59 | Source: Ambulatory Visit | Attending: Internal Medicine | Admitting: Internal Medicine

## 2011-01-04 DIAGNOSIS — Z124 Encounter for screening for malignant neoplasm of cervix: Secondary | ICD-10-CM

## 2011-01-04 DIAGNOSIS — E785 Hyperlipidemia, unspecified: Secondary | ICD-10-CM

## 2011-01-04 DIAGNOSIS — N950 Postmenopausal bleeding: Secondary | ICD-10-CM | POA: Insufficient documentation

## 2011-01-04 DIAGNOSIS — N898 Other specified noninflammatory disorders of vagina: Secondary | ICD-10-CM

## 2011-01-04 DIAGNOSIS — Z0000 Encounter for general adult medical examination without abnormal findings: Secondary | ICD-10-CM

## 2011-01-04 DIAGNOSIS — Z01419 Encounter for gynecological examination (general) (routine) without abnormal findings: Secondary | ICD-10-CM | POA: Insufficient documentation

## 2011-01-04 DIAGNOSIS — N939 Abnormal uterine and vaginal bleeding, unspecified: Secondary | ICD-10-CM

## 2011-01-04 DIAGNOSIS — I10 Essential (primary) hypertension: Secondary | ICD-10-CM

## 2011-01-04 DIAGNOSIS — N859 Noninflammatory disorder of uterus, unspecified: Secondary | ICD-10-CM | POA: Insufficient documentation

## 2011-01-04 DIAGNOSIS — R7309 Other abnormal glucose: Secondary | ICD-10-CM

## 2011-01-04 MED ORDER — PRAVASTATIN SODIUM 40 MG PO TABS: 40.0000 mg | ORAL_TABLET | Freq: Every day | ORAL | Status: DC

## 2011-01-04 MED ORDER — HYDROCHLOROTHIAZIDE 12.5 MG PO CAPS: 12.5000 mg | ORAL_CAPSULE | Freq: Every day | ORAL | Status: DC

## 2011-01-04 NOTE — Patient Instructions (Signed)
Our office will contact you re: pelvic ultrasound results. Please return in 3 months for repeat cholesterol blood tests.

## 2011-01-04 NOTE — Telephone Encounter (Signed)
Call pt - transvag u/s shows endometrial atrophy.  No further follow up testing needed

## 2011-01-04 NOTE — Progress Notes (Signed)
Subjective:    Patient ID: [name], female    DOB: 05-12-1946, 65 y.o.   MRN: [id number]  HPI 65 y/o female with hx of hyperlipidemia and hypertension for follow up and routine cpx  She noticed right shoulder / upper arm and left leg pain.    She thinks she also has bursitis of right shoulder.   Discomfort felt like it was muscle related vs cervical disc disease She has been working more that usual - on computer all day.  Working 12 hr days.  Pt also notes left upper thigh soreness.  Worse on elliptical machine.  She stopped using estrace vaginal cream due to concerns of breat cancer.  She has abnormal mammo last yr.  Pt had biopsy of left breast - normal.   Vaginal dryness has been worse. She notes pink discharge after having sexual intercourse  She excercises regularly  Htn - stable  Past Medical History  Diagnosis Date  . Hemorrhoids   . Fainting spell   . Ulcer, esophagus   . History of hepatitis     jaundice  . Migraines     teenager  . Depression   . GERD (gastroesophageal reflux disease)     see GI  . Headache   . Glucose intolerance (impaired glucose tolerance)   . Arthritis   . Hypertension   . Osteoarthritis   . Hyperlipemia   . IBS (irritable bowel syndrome)     poorly functioning gall bladder- sees GI  . Reactive airway disease   . Adenomatous polyp of colon     11/1996  . Hiatal hernia   . Esophageal stricture   . DJD (degenerative joint disease)     chronic right ankle pain   History   Social History  . Marital Status: Married    Spouse Name: N/A    Number of Children: N/A  . Years of Education: N/A   Occupational History  . Case Production designer, theatre/television/film / Research scientist (medical) for [employer]    Social History Main Topics  . Smoking status: Never Smoker   . Smokeless tobacco: Not on file  . Alcohol Use: Not on file  . Drug Use: Not on file  . Sexually Active: Not on file   Other Topics Concern  . 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  HENT: Negative for hearing loss.   Respiratory: Negative.   Cardiovascular: Negative.  Negative for chest pain.  Genitourinary: Positive for vaginal bleeding, pelvic pain and dyspareunia.  Musculoskeletal: Negative for back pain.  Psychiatric/Behavioral: Negative.    Pt having post coital pink vaginal discharge Pt stopped taking simvastatin due to complaints of myalgias    Objective:   Physical Exam  Constitutional: She is oriented to person, place, and time. She appears well-developed and well-nourished. No distress.  HENT:  Head: Normocephalic and atraumatic.  Right Ear: External ear normal.  Left Ear: External ear normal.  Mouth/Throat: Oropharynx is clear and moist.  Eyes: Conjunctivae are normal. Pupils are equal, round, and reactive to light. Right eye exhibits no discharge. Left eye exhibits no discharge.  Neck: Normal range of motion. Neck supple.       No carotid bruit  Cardiovascular: Normal rate, regular rhythm and normal heart sounds.   Pulses:  Carotid pulses are 0 on the right side, and 0 on the left side. Pulmonary/Chest: Effort normal and breath sounds normal. No respiratory distress. She has no wheezes. She has no rales.  Abdominal: Soft. Bowel sounds are normal. She exhibits no mass.  Musculoskeletal: Normal range of motion. She exhibits no edema.  Lymphadenopathy:    She has no cervical adenopathy.  Neurological: She is oriented to person, place, and time. No cranial nerve deficit.  Skin: Skin is warm and dry.  Psychiatric: She has a normal mood and affect. Her behavior is normal.          Assessment & Plan:

## 2011-01-04 NOTE — Assessment & Plan Note (Addendum)
She tried pravastatin but insufficient LDL reduction Pt stopped simvastatin 2 weeks ago due to myalgias (right upper arm and left thigh)  Restart pravastatin. Low sat fat diet handout provided

## 2011-01-05 LAB — URINALYSIS
Bilirubin Urine: NEGATIVE
Glucose, UA: NEGATIVE mg/dL
Hgb urine dipstick: NEGATIVE
Ketones, ur: NEGATIVE mg/dL
Leukocytes, UA: NEGATIVE
Nitrite: NEGATIVE
Protein, ur: NEGATIVE mg/dL
Specific Gravity, Urine: 1.005 (ref 1.005–1.030)
Urobilinogen, UA: 0.2 mg/dL (ref 0.0–1.0)
pH: 6.5 (ref 5.0–8.0)

## 2011-01-05 LAB — LIPID PANEL
Cholesterol: 212 mg/dL — ABNORMAL HIGH (ref 0–200)
HDL: 55 mg/dL (ref >39)
LDL Cholesterol: 128 mg/dL — ABNORMAL HIGH (ref 0–99)
Total CHOL/HDL Ratio: 3.9 Ratio
Triglycerides: 143 mg/dL (ref <150)
VLDL: 29 mg/dL (ref 0–40)

## 2011-01-05 LAB — BASIC METABOLIC PANEL WITH GFR
BUN: 14 mg/dL (ref 6–23)
CO2: 22 mEq/L (ref 19–32)
Calcium: 9.6 mg/dL (ref 8.4–10.5)
Chloride: 103 mEq/L (ref 96–112)
Creat: 0.83 mg/dL (ref 0.40–1.20)
GFR, Est African American: >60 mL/min (ref >60)
GFR, Est Non African American: >60 mL/min (ref >60)
Glucose, Bld: 87 mg/dL (ref 70–99)
Potassium: 4 mEq/L (ref 3.5–5.3)
Sodium: 139 mEq/L (ref 135–145)

## 2011-01-05 LAB — TSH: TSH: 2.932 u[IU]/mL (ref 0.350–4.500)

## 2011-01-05 LAB — HEMOGLOBIN A1C
Hgb A1c MFr Bld: 5.7 % — ABNORMAL HIGH (ref <5.7)
Mean Plasma Glucose: 117 mg/dL — ABNORMAL HIGH (ref <117)

## 2011-01-05 LAB — HIGH SENSITIVITY CRP: CRP, High Sensitivity: 2.3 mg/L

## 2011-01-05 NOTE — Telephone Encounter (Signed)
Call placed to patient at [phone], no answer. A detailed voice message was left informing patient per Dr Artist Pais instructions

## 2011-01-07 ENCOUNTER — Other Ambulatory Visit: Payer: Self-pay | Admitting: Internal Medicine

## 2011-01-07 DIAGNOSIS — E785 Hyperlipidemia, unspecified: Secondary | ICD-10-CM

## 2011-01-07 DIAGNOSIS — Z0000 Encounter for general adult medical examination without abnormal findings: Secondary | ICD-10-CM | POA: Insufficient documentation

## 2011-01-07 DIAGNOSIS — N939 Abnormal uterine and vaginal bleeding, unspecified: Secondary | ICD-10-CM | POA: Insufficient documentation

## 2011-01-07 NOTE — Miscellaneous (Signed)
Summary: mammogram  Clinical Lists Changes  Observations: Added new observation of MAMMOGRAM: Stable. No suspicious findings (12/29/2010 9:28)      Preventive Care Screening  Mammogram:    Date:  12/29/2010    Results:  Stable. No suspicious findings

## 2011-01-07 NOTE — Assessment & Plan Note (Signed)
Reviewed adult health maintenance protocols.  Pt up to date Status of adult vaccines and cancer screening reviewed PAP / Pelvic exam peformed  Pt counseled on diet and exercise

## 2011-01-07 NOTE — Assessment & Plan Note (Signed)
Well controlled.  Continue meds  Lab Results  Component Value Date   CREATININE 0.83 01/04/2011    BP Readings from Last 3 Encounters:  01/04/11 100/80  03/27/10 110/80  12/26/09 120/80

## 2011-01-07 NOTE — Assessment & Plan Note (Signed)
Mild pink discharge after sexual intercourse Rule out endometrial lesion

## 2011-01-09 ENCOUNTER — Emergency Department (INDEPENDENT_AMBULATORY_CARE_PROVIDER_SITE_OTHER): Payer: 59

## 2011-01-09 ENCOUNTER — Emergency Department (HOSPITAL_BASED_OUTPATIENT_CLINIC_OR_DEPARTMENT_OTHER)
Admission: EM | Admit: 2011-01-09 | Discharge: 2011-01-09 | Disposition: A | Discharge status: Home / Self Care | Payer: 59 | Attending: Emergency Medicine | Admitting: Emergency Medicine

## 2011-01-09 DIAGNOSIS — I10 Essential (primary) hypertension: Secondary | ICD-10-CM | POA: Insufficient documentation

## 2011-01-09 DIAGNOSIS — M5412 Radiculopathy, cervical region: Secondary | ICD-10-CM | POA: Insufficient documentation

## 2011-01-09 DIAGNOSIS — E785 Hyperlipidemia, unspecified: Secondary | ICD-10-CM | POA: Insufficient documentation

## 2011-01-09 DIAGNOSIS — M25519 Pain in unspecified shoulder: Secondary | ICD-10-CM

## 2011-01-11 ENCOUNTER — Encounter: Payer: Self-pay | Admitting: Internal Medicine

## 2011-01-15 ENCOUNTER — Other Ambulatory Visit: Payer: Self-pay | Admitting: Internal Medicine

## 2011-02-23 NOTE — Discharge Summary (Signed)
[name], [name]                ACCOUNT NO.:  [id number]   MEDICAL RECORD NO.:  [id number]          PATIENT TYPE:  INP   LOCATION:  1617                         FACILITY:  [facility]   PHYSICIAN:  Ollen Gross, M.D.    DATE OF BIRTH:  09-Jun-1946   DATE OF ADMISSION:  06/24/2008  DATE OF DISCHARGE:  06/27/2008                               DISCHARGE SUMMARY   ADMISSION DIAGNOSES:  1. Osteoarthritis left knee.  2. Situational anxiety.  3. Hiatal hernia.  4. Reflux disease.  5. History of esophageal ulcerations.  6. History of gastritis.  7. Hemorrhoids.  8. Childhood hepatitis.  9. Spastic colon.  10.Osteopenia.  11.Postmenopausal.   DISCHARGE DIAGNOSES:  1. Osteoarthritis left knee, status post left total knee replacement      arthroplasty.  2. Postoperative blood loss anemia, did not require transfusion.  3. Situational anxiety.  4. Hiatal hernia.  5. Reflux disease.  6. History of esophageal ulcerations.  7. History of gastritis.  8. Hemorrhoids.  9. Childhood hepatitis.  10.Spastic colon.  11.Osteopenia.  12.Postmenopausal.   PROCEDURE:  Left total knee arthroplasty done on June 24, 2008.  Surgeon Dr. Lequita Halt, assisted by Avel Peace, PAC.  Anesthesia spinal  with Duramorph.   CONSULTATIONS:  None.   BRIEF HISTORY OF PRESENT ILLNESS:  [name] is a 64 year old female  with end stage osteoarthritis of left knee, progressive pain and  dysfunction without operative management and now presents for total knee  replacement.   LABORATORY DATA:  Preoperative CBC showed hemoglobin 12.4, hematocrit  38.4, white count 5.5, platelet count 259,000.  Chemistry panel:  Sodium  146, only minimally elevated.  The remainder of the chem panel within  normal limits.  PT/INR 12.8 and 1.0 preoperatively with a PTT of 26.  Preoperative urinalysis was negative.  Serial. Pro Time's followed.  Last noted PT/INR 21.2 and 1.7.  Serial CBC's were followed and  hemoglobin did drop  down to 9.4 and then down to 8.7, came back up to  9.0.  Last  noted H and H were 8.9 and 26.9.  Serial BMET's were  followed.  Electrolytes remained within normal limits.   Chest x-ray June 18, 2008 showed no acute disease.   The EKG dated May 14, 2008:  Normal sinus rhythm, possible R wave  progression in V1 through V4, otherwise normal.   HOSPITAL COURSE:  The patient was admitted to [facility],  tolerated the procedure well and later was sent to the recovery room and  then orthopedic floor.  Was started on PCA and p.o. analgesics for pain  control following surgery.  She had a little bit of low grade  temperature on the evening of surgery and morning of day #1, about 100.  Encouraged incentive spirometer.  Hemoglobin was down to 9.4. She was  placed on iron.  She was placed back on her home medications including  the Kapidex due to her history of esophageal reflux and ulcers and  Ativan p.r.n. was restarted.  She started getting up out of bed. By day  #1 she was already walking 100  feet.  She was doing extremely well with  therapy.  She was weaned over to p.o. medications.  By day #2 the IV's  were discontinued.  Hemoglobin was down to 8.7 but she was asymptomatic  with this.  She was placed on iron.  Her dressing was changed and the  incision looked good.  Despite the low hemoglobin she continued to  progress well with therapy and by day #3 she was doing well, tolerating  med's and was discharged home.   DISCHARGE PLAN:  The patient was discharged to home on June 27, 2008.   DISCHARGE DIAGNOSES:  Please see above.   DISCHARGE MEDICATIONS:  1. Percocet.  2. Robaxin.  3. Nu-Iron.  4. Coumadin.   DIET:  As tolerated.   FOLLOWUP:  The patient is to follow up with Dr. Lequita Halt in the office  two weeks from the date of surgery.  She is to contact the office for an  appointment.   DISPOSITION:  To home.   ACTIVITY:  She can be weight-bearing as  tolerated.  Total knee protocol.  Home PT, OT and home health nursing.   CONDITION ON DISCHARGE:  Improved.      [name], P.A.C.      Ollen Gross, M.D.  Electronically Signed    ALP/MEDQ  D:  06/27/2008  T:  06/28/2008  Job:  [id number]   cc:   Valetta Mole. Swords, MD  [address] [place]  [place] [address]   Jesse Sans. Wall, MD, FACC  [address]  [address]  [place]  [place] [address]

## 2011-02-23 NOTE — H&P (Signed)
NAMEANNASTACIA, DUBA NO.:  [id number]   MEDICAL RECORD NO.:  [id number]          PATIENT TYPE:  INP   LOCATION:  1617                         FACILITY:  [facility]   PHYSICIAN:  Ollen Gross, M.D.    DATE OF BIRTH:  04-25-1946   DATE OF ADMISSION:  06/24/2008  DATE OF DISCHARGE:                              HISTORY & PHYSICAL   CHIEF COMPLAINT:  Left knee pain.   HISTORY OF PRESENT ILLNESS:  The patient is a 65 year old female who has  been seen by Dr. Lequita Halt for ongoing left knee pain.  She has known end  stage osteoarthritis that has been refractory to conservative measures  and felt she could benefit from knee replacement.  The risks and  benefits were discussed and she elected to proceed with surgery.  She  has been seen preoperatively by Dr. Cato Mulligan and felt to be stable for  surgery.   ALLERGIES:  ANTI-INFLAMMATORIES.   CURRENT MEDICATIONS:  1. Kapidex.  2. Levosin.  3. Ativan.  4. Fish oil.  5. Calcium.  6. Multivitamin.   PAST MEDICAL HISTORY:  1. Situational anxiety.  2. Hiatal hernia.  3. Reflux disease.  4. History of esophageal ulcerations.  5. History of gastritis.  6. History of hemorrhoids.  7. Childhood hepatitis.  8. Spastic colon.  9. Osteopenia.   PAST SURGICAL HISTORY:  1. Hysteroscopy and D and C.  2. Left knee arthroscopy.   FAMILY HISTORY:  Father deceased age 57 with pancreatic cancer.  Mother  living, type 2 diabetes and atrial fibrillation.  She has one sister and  two brothers.   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  Husband will be  assisting with care after the surgery.   REVIEW OF SYSTEMS:  GENERAL:  No fevers, chills, night sweats.  NEUROLOGICAL:  No seizures, syncope or paralysis. RESPIRATORY:  No  productive cough, hemoptysis or shortness of breath.  CARDIOVASCULAR:  No chest pain, angina or orthopnea. GASTROINTESTINAL:  No nausea,  vomiting, diarrhea, constipation.  GENITOURINARY:  No  dysuria,  hematuria, discharge.  MUSCULOSKELETAL:  Left knee.   PHYSICAL EXAMINATION:  VITAL SIGNS:  Pulse 76, respiratory rate 14,  blood pressure 136/82.  GENERAL:  65 year old white female, well-nourished, well-developed,  short stature, in no acute distress.  She is alert, oriented and  cooperative.  HEENT:  Normocephalic, atraumatic.  Pupils are round and reactive.  Oropharynx is clear.  Extraocular movements intact.  NECK:  Supple.  CHEST:  Clear.  HEART:  Regular rate and rhythm, no murmurs.  ABDOMEN:  Soft, nontender.  Bowel sounds present.  RECTAL, BREASTS, GENITOURINARY:  Not done, not pertinent to present  illness.  EXTREMITIES:  Left knee: Slight varus and malalignment deformity.  Range  of motion 10 to 120.  Marked crepitus.   IMPRESSION:  Osteoarthritis left knee.   PLAN:  Patient admitted to [facility] to undergo a left total  knee replacement arthroplasty.  Surgery will be performed by Dr. Ollen Gross.      Alexzandrew L. Perkins, P.A.C.      Homero Fellers Aluisio,  M.D.  Electronically Signed    ALP/MEDQ  D:  06/27/2008  T:  06/28/2008  Job:  [id number]   cc:   Valetta Mole. Swords, MD  [address] [place]  [place] [address]   Jesse Sans. Wall, MD, FACC  [address]  [address]  [place]  [place] [address]

## 2011-02-23 NOTE — Op Note (Signed)
[name], [name] NO.:  [id number]   MEDICAL RECORD NO.:  [id number]          PATIENT TYPE:  INP   LOCATION:  0006                         FACILITY:  [facility]   PHYSICIAN:  Ollen Gross, M.D.    DATE OF BIRTH:  1946/02/22   DATE OF PROCEDURE:  06/24/2008  DATE OF DISCHARGE:                               OPERATIVE REPORT   PREOPERATIVE DIAGNOSIS:  Osteoarthritis left knee.   POSTOPERATIVE DIAGNOSIS:  Osteoarthritis left knee.   PROCEDURE:  Left total knee arthroplasty.   SURGEON:  Ollen Gross, M.D.   ASSISTANT:  Avel Peace PA-C.   ANESTHESIA:  Spinal with Duramorph.   ESTIMATED BLOOD LOSS:  Minimal.   DRAINS:  None.   TOURNIQUET TIME:  32 minutes at 300 mmHg.   COMPLICATIONS:  None.   CONDITION:  Stable to recovery room.   CLINICAL NOTE:  [name] is a 65 year old female with end-stage  arthritis of the left knee with progressively worsening pain and  dysfunction.  She has failed nonoperative management and presents now  for total knee arthroplasty.   PROCEDURE IN DETAIL:  After the successful administration of spinal  anesthetic, a tourniquet is placed on the left thigh and left lower  extremity prepped and draped in the usual sterile fashion.  Extremity is  wrapped in Esmarch, knee flexed, tourniquet inflated to 300 mmHg.  Midline incision is made with a 10 blade through subcutaneous tissue to  the level of the extensor mechanism.  A fresh blade is used make a  medial parapatellar arthrotomy.  Soft tissue of the proximal medial  tibia is subperiosteally elevated to the joint line with the knife and  into the semimembranosus bursa with a Cobb elevator.  Soft tissue  laterally is elevated with attention being paid to avoiding the patellar  tendon on tibial tubercle.  Patella is subluxed laterally and knee  flexed 90 degrees, ACL and PCL removed.  Drill is used to create a  starting hole in the distal femur and canal is thoroughly  irrigated.  The 5-degree left valgus alignment guide is placed and referencing off  the posterior condyles.  Rotation is marked and a block pinned to remove  11 mm of the distal femur.  I took 11 mm because of preop flexion  contracture.  Distal femoral resection is made with an oscillating saw.  Sizing block is placed and size 2.5 is most appropriate.  Rotation is  marked off the epicondylar axis.  The size 2.5 cutting block is placed  and the anterior, posterior and chamfer cuts made.   Tibia is subluxed forward and menisci removed.  The extramedullary  tibial alignment guide is placed referencing proximally at the medial  aspect of the tibial tubercle and distally along the second metatarsal  axis at the tibial crest.  A block is pinned to remove about 2 mm off  the more deficient medial side.  Tibial resection is made with an  oscillating saw.  Size 2.5 is most appropriate tibial component and the  proximal tibia is prepared with the modular drill and keel punch for  a  size 2.5.  Femoral preparation is completed with the intercondylar cut.   Size 2.5 mobile bearing tibial trial, 2.5 posterior stabilized femoral  trial and a 10-mm posterior stabilized rotating platform insert trial  are placed.  With the 10, full extension is achieved with excellent  varus, valgus, anterior and posterior balance throughout full range of  motion.  Patella is everted, thickness measured to be 24 mm.  Freehand  resection is taken to 14 mm, 35 template is placed, lug holes are  drilled, trial patella is placed and it tracks normally.  Osteophytes  are removed off the posterior femur with the trial in place.  All trials  are removed and the cut bone surfaces prepared with pulsatile lavage.  Cement is mixed and once ready for implantation, a size 2.5 mobile  bearing tibia, size 2.5 posterior stabilized femur and 35 patella are  cemented into place.  The patella is held with a clamp.  Trial 10-mm  insert is  placed, knee held in full extension and all extruded cement  removed.  When the cement is fully hardened, then the permanent 10-mm  posterior stabilized rotating platform insert is placed into the tibial  tray.  Wound is copiously irrigated with saline solution and then  FloSeal injected on the posterior capsule, mediolateral gutters and  suprapatellar area.  Moist sponge is placed and tourniquet released for  total time of 32 minutes.  Sponge is held for 2 minutes then removed.  Minimal bleeding is encountered.  That which is encountered is stopped  with electrocautery.  Wound is again irrigated and the arthrotomy closed  with interrupted #1 PDS.  Flexion against gravity is about 135 degrees.  Subcu is closed with interrupted 2-0 Vicryl and subcuticular running 4-0  Monocryl.  Incision is  cleaned and dried and Steri-Strips and a bulky  sterile dressing applied.  She is then placed into a knee immobilizer,  awakened and transported to recovery in stable condition.      Ollen Gross, M.D.  Electronically Signed     FA/MEDQ  D:  06/24/2008  T:  06/25/2008  Job:  [id number]

## 2011-02-23 NOTE — Assessment & Plan Note (Signed)
[facility]                         GASTROENTEROLOGY OFFICE NOTE   NAME:[name], [name]                       MRN:          [id number]  DATE:01/04/2008                            DOB:          1946/07/07    Mrs. Sellick complains of a 73-month history of intermittent crampy left  upper quadrant pain associated with nausea. Her symptoms have gradually  improved since increasing Omeprazole to 20 mg p.o. b.i.d. She has  frequent breakthrough reflux symptoms with mid-sternal chest burning and  occasional regurgitation. She recently has noted intermittent solid food  dysphagia. She relates an episode of small amounts of bright red rectal  bleeding approximately 1 month ago which resolved with Preparation-H  suppositories. She has a personal history of adenomatous colon polyps  and a family history of colon cancer. She has no odynophagia, weight  loss, change in bowel habits, change in stool caliber, vomiting, fevers,  or chills.   CURRENT MEDICATIONS:  Listed on the chart; updated and reviewed.   MEDICATION ALLERGIES:  LIDOCAINE and ADVIL.   PHYSICAL EXAMINATION:  GENERAL:  Well-developed, well-nourished white  female in no acute distress. Weight 152.8 pounds.  Blood pressure 112/78, pulse 72 and regular.  HEENT:  Anicteric sclerae. Oropharynx:  Clear.  CHEST:  Clear to auscultation bilaterally.  CARDIAC:  Regular rate and rhythm without murmurs.  ABDOMEN:  Soft, nontender with normoactive bowel sounds. No palpable  organomegaly, masses, or hernias.  REDTAL: Deferred to colonoscopy   ASSESSMENT AND PLAN:  1. Left upper quadrant pain, refractory reflux symptoms and solid food      dysphagia. Rule out ulcer disease, esophagitis, and recurrent      peptic strictures. Discontinue Omeprazole and begin Kapidex 60 mg      p.o. q.a.m. Reintensify anti-reflux measures. Risks, benefits, and      alternatives to upper endoscopy with possible biopsy and possible      dilation discussed with the patient, and she consents to proceed.      This will be scheduled electively.  2. Chronic right flank pain. Likely musculoskeletal in etiology.  3. Small volume hematochezia. I suspect this is hemorrhoidal bleeding.      Family history of colon cancer and personal history of adenomatous      colon polyps. Risks, benefits, and alternatives to colonoscopy with      possible biopsy, possible polypectomy, and possible      destruction of internal hemorrhoids discussed with the patient, and      she consents to proceed. This will be scheduled at the time of her      upper endoscopy.     Venita Lick. Russella Dar, MD, [facility]  Electronically Signed    MTS/MedQ  DD: 01/05/2008  DT: 01/05/2008  Job #: [id number]   cc:   Valetta Mole. Swords, MD

## 2011-02-26 NOTE — Assessment & Plan Note (Signed)
[facility]                         GASTROENTEROLOGY OFFICE NOTE   NAME:[name], [name]                       MRN:          [id number]  DATE:10/26/2006                            DOB:          January 15, 1946    [name] notes right-sided chest pain when bending or leaning to her  right.  Her hematochezia has resolved.  Her reflux symptoms are well-  controlled on AcipHex.  Abdominal CT scans from October 05, 2006 showed  a moderate-sized hiatal hernia and mild atheromatous change in the  abdominal aorta.  No other abnormalities were noted.  She notes her  right-sided pain is better since she has not been bending or leaning to  her right.   Medications listed on the chart, updated, and reviewed.   MEDICATION ALLERGIES:  LIDOCAINE, ADVIL.   EXAM:  In no acute distress.  Weight 152 pounds, blood pressure 120/84, pulse 60 and regular.  She is not re-examined.   ASSESSMENT AND PLAN:  1. Gastroesophageal reflux disease.  Continue AcipHex 20 mg p.o. q.      a.m. with all standard antireflux measures.  2. Right chest pain.  Probable musculoskeletal etiology.  Further      followup with her primary physician.  3. Small volume hematochezia, likely related to hemorrhoids noted on      last colonoscopy in August 2004.  If her hematochezia is recurrent,      she will return for further followup and consider colonoscopy.  4. Personal history of adenomatous colon polyps.  Recall colonoscopy      recommended for August 2009.     Venita Lick. Russella Dar, MD, [facility]  Electronically Signed    MTS/MedQ  DD: 10/27/2006  DT: 10/27/2006  Job #: [id number]   cc:   Magnus Sinning. Rice, M.D.

## 2011-02-26 NOTE — Assessment & Plan Note (Signed)
[facility]                         GASTROENTEROLOGY OFFICE NOTE   NAME:Bambach, CALEN POSCH                       MRN:          [id number]  DATE:10/05/2006                            DOB:          01/10/1946    [name] returns with complaints of frequent epigastric pain, reflux  and regurgitation since discontinuing Nexium. She can not obtain Nexium  on her prescription plan. She has been using omeprazole 20 mg b.i.d.  with inadequate control of her reflux symptoms. She has a second symptom  of intermittent right upper quadrant and right flank pain that is mild,  associated with nausea and occasionally follows meals, these symptoms  are intermittent and they have been relatively inactive for the past few  weeks. Her third symptom is that of right, lower lateral chest pain and  tenderness. These symptoms are exacerbated by movement and can be  reproduced by palpation. Lastly she notes problems with constipation  that worsen when she is off Metamucil and she has had occasional  episodes of small amounts of bright red blood per rectum generally  associated with constipation. She has known hemorrhoids both internal  and external from her most recent colonoscopy in August 2004. She has no  weight loss, dysphagia, odynophagia, change in stool caliber, fevers,  chills, or vomiting.   MEDICATION ALLERGIES:  LIDOCAINE AND ADVIL.   MEDICATIONS:  Listed on the chart-updated and reviewed.   PHYSICAL EXAMINATION:  No acute distress, weight 153.8 pounds, blood  pressure 148/88, pulse 80 and regular.  HEENT EXAM: Anicteric sclera, oropharynx clear.  CHEST: Clear to auscultation  bilaterally. There is significant right  lateral costal margin tenderness to palpation.  CARDIAC: Regular rate and rhythm without murmurs.  ABDOMEN: Soft, nontender, nondistended, normoactive bowel sounds. No  palpable organomegaly, masses, or hernias.  RECTAL EXAMINATION: Deferred.  NEUROLOGIC: Alert and oriented x 3. Grossly nonfocal.   ASSESSMENT/PLAN:  1. Gastroesophageal reflux disease with inadequate symptom control.      Reintensify antiflux measures. Begin a 7 to 10 day trial of AcipHex      followed by a 7 to 10 day trial of Zegerid. She will call and      notify us which medication she prefers.  2. Lower, right lateral chest wall pain. This appears to be      musculoskeletal. Schedule an abdomen CT for further evaluation.  3. 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 Symptoms intermittent      and currently not active. Consider reevaluation with abdominal      ultrasound imaging and a gallbladder ejection fraction. Consider      surgical referal.  4. Constipation. Maintain a high fiber diet with adequate fluid      intake.  5. Internal and external hemorrhoids. Constipation management as      above. May use Anusol      HC cream and Anusol suppositories along with standard hemorrhoid      and rectal care instructions.     Venita Lick. Russella Dar, MD, [facility]  Electronically Signed    MTS/MedQ  DD: 10/05/2006  DT: 10/05/2006  Job #: [id number]  cc:   Magnus Sinning. Rice, M.D.

## 2011-04-04 ENCOUNTER — Other Ambulatory Visit: Payer: Self-pay | Admitting: Internal Medicine

## 2011-04-05 NOTE — Telephone Encounter (Signed)
Rx refill sent to pharmacy. 

## 2011-05-04 ENCOUNTER — Ambulatory Visit: Payer: 59 | Attending: Orthopedic Surgery | Admitting: Physical Therapy

## 2011-05-04 DIAGNOSIS — IMO0001 Reserved for inherently not codable concepts without codable children: Secondary | ICD-10-CM | POA: Insufficient documentation

## 2011-05-04 DIAGNOSIS — M25519 Pain in unspecified shoulder: Secondary | ICD-10-CM | POA: Insufficient documentation

## 2011-05-06 ENCOUNTER — Ambulatory Visit: Payer: 59 | Admitting: Physical Therapy

## 2011-05-10 ENCOUNTER — Ambulatory Visit: Payer: 59 | Admitting: Physical Therapy

## 2011-05-13 ENCOUNTER — Ambulatory Visit: Payer: 59 | Attending: Orthopedic Surgery | Admitting: Physical Therapy

## 2011-05-13 DIAGNOSIS — IMO0001 Reserved for inherently not codable concepts without codable children: Secondary | ICD-10-CM | POA: Insufficient documentation

## 2011-05-13 DIAGNOSIS — M25519 Pain in unspecified shoulder: Secondary | ICD-10-CM | POA: Insufficient documentation

## 2011-05-18 ENCOUNTER — Ambulatory Visit: Payer: 59 | Admitting: Physical Therapy

## 2011-05-20 ENCOUNTER — Ambulatory Visit: Payer: 59 | Admitting: Physical Therapy

## 2011-05-25 ENCOUNTER — Ambulatory Visit: Payer: 59 | Admitting: Physical Therapy

## 2011-05-31 ENCOUNTER — Encounter: Payer: 59 | Admitting: Physical Therapy

## 2011-06-01 ENCOUNTER — Telehealth: Payer: Self-pay | Admitting: Internal Medicine

## 2011-06-01 MED ORDER — HYOSCYAMINE SULFATE 0.125 MG PO TABS: 0.1250 mg | ORAL_TABLET | Freq: Three times a day (TID) | ORAL | Status: DC | PRN

## 2011-06-01 NOTE — Telephone Encounter (Signed)
rx sent in electronically 

## 2011-06-01 NOTE — Telephone Encounter (Signed)
Refill- hyomax-sl 0.125mg  sub aris. Dissolve one tablet under tongue every 8 hours as needed. Qty 90. Last fill 11.14.11

## 2011-06-08 ENCOUNTER — Telehealth: Payer: Self-pay | Admitting: Internal Medicine

## 2011-06-08 MED ORDER — OMEPRAZOLE 20 MG PO CPDR: 20.0000 mg | DELAYED_RELEASE_CAPSULE | Freq: Two times a day (BID) | ORAL | Status: DC

## 2011-06-08 NOTE — Telephone Encounter (Signed)
Refill- omeprazole 20mg  cap myla. Take one capsule by mouth twice daily. Qty 60. Last fill 7.28.12

## 2011-06-08 NOTE — Telephone Encounter (Signed)
rx sent in electronically 

## 2011-07-12 LAB — CBC
HCT: 26.3 — ABNORMAL LOW
HCT: 26.9 — ABNORMAL LOW
HCT: 28.6 — ABNORMAL LOW
Hemoglobin: 8.7 — ABNORMAL LOW
Hemoglobin: 8.9 — ABNORMAL LOW
Hemoglobin: 9.4 — ABNORMAL LOW
MCHC: 32.9
MCHC: 32.9
MCHC: 33.1
MCV: 78.4
MCV: 78.5
MCV: 79.1
Platelets: 180
Platelets: 190
Platelets: 238
RBC: 3.35 — ABNORMAL LOW
RBC: 3.4 — ABNORMAL LOW
RBC: 3.65 — ABNORMAL LOW
RDW: 14.2
RDW: 14.3
RDW: 14.5
WBC: 5.3
WBC: 6
WBC: 7.6

## 2011-07-12 LAB — BASIC METABOLIC PANEL
BUN: 4 — ABNORMAL LOW
BUN: 9
CO2: 28
CO2: 28
Calcium: 8.7
Calcium: 8.8
Chloride: 106
Chloride: 106
Creatinine, Ser: 0.75
Creatinine, Ser: 0.82
GFR calc Af Amer: >60
GFR calc Af Amer: >60
GFR calc non Af Amer: >60
GFR calc non Af Amer: >60
Glucose, Bld: 107 — ABNORMAL HIGH
Glucose, Bld: 119 — ABNORMAL HIGH
Potassium: 4.2
Potassium: 4.7
Sodium: 139
Sodium: 140

## 2011-07-12 LAB — HEMOGLOBIN AND HEMATOCRIT, BLOOD
HCT: 27.1 — ABNORMAL LOW
Hemoglobin: 9 — ABNORMAL LOW

## 2011-07-12 LAB — PROTIME-INR
INR: 1.1
INR: 1.7 — ABNORMAL HIGH
INR: 1.8 — ABNORMAL HIGH
Prothrombin Time: 14.4
Prothrombin Time: 21.2 — ABNORMAL HIGH
Prothrombin Time: 21.8 — ABNORMAL HIGH

## 2011-07-14 LAB — COMPREHENSIVE METABOLIC PANEL
ALT: 17
AST: 20
Albumin: 4
Alkaline Phosphatase: 53
BUN: 10
CO2: 29
Calcium: 10
Chloride: 109
Creatinine, Ser: 0.82
GFR calc Af Amer: >60
GFR calc non Af Amer: >60
Glucose, Bld: 104 — ABNORMAL HIGH
Potassium: 5.1
Sodium: 146 — ABNORMAL HIGH
Total Bilirubin: 0.7
Total Protein: 7.3

## 2011-07-14 LAB — URINALYSIS, ROUTINE W REFLEX MICROSCOPIC
Bilirubin Urine: NEGATIVE
Glucose, UA: NEGATIVE
Hgb urine dipstick: NEGATIVE
Ketones, ur: NEGATIVE
Nitrite: NEGATIVE
Protein, ur: NEGATIVE
Specific Gravity, Urine: 1.011
Urobilinogen, UA: 0.2
pH: 7

## 2011-07-14 LAB — TYPE AND SCREEN
ABO/RH(D): A NEG
Antibody Screen: POSITIVE
DAT, IgG: NEGATIVE

## 2011-07-14 LAB — PROTIME-INR
INR: 1
Prothrombin Time: 12.8

## 2011-07-14 LAB — CBC
HCT: 38.4
Hemoglobin: 12.4
MCHC: 32.4
MCV: 79.9
Platelets: 259
RBC: 4.8
RDW: 14.3
WBC: 5.5

## 2011-07-14 LAB — APTT: aPTT: 26

## 2011-09-06 ENCOUNTER — Telehealth: Payer: Self-pay | Admitting: Internal Medicine

## 2011-09-06 MED ORDER — OMEPRAZOLE 20 MG PO CPDR: 20.0000 mg | DELAYED_RELEASE_CAPSULE | Freq: Two times a day (BID) | ORAL | Status: DC

## 2011-09-06 NOTE — Telephone Encounter (Signed)
Refill- omeprazole 20mg  cap myla. Take one capsule by mouth twice daily. Qty 60 last fill 10.28.12

## 2011-09-06 NOTE — Telephone Encounter (Signed)
rx sent in electronically 

## 2011-10-04 ENCOUNTER — Telehealth: Payer: Self-pay | Admitting: Internal Medicine

## 2011-10-04 MED ORDER — HYOSCYAMINE SULFATE 0.125 MG PO TABS: 0.1250 mg | ORAL_TABLET | Freq: Three times a day (TID) | ORAL | Status: DC | PRN

## 2011-10-04 NOTE — Telephone Encounter (Signed)
Refill- levsin 0.125mg  sub schw. Dissolve one tablet under tongue every 8 hours as needed. Qty 90 last fill 8.23.12

## 2011-10-22 ENCOUNTER — Ambulatory Visit (INDEPENDENT_AMBULATORY_CARE_PROVIDER_SITE_OTHER): Payer: 59 | Admitting: Internal Medicine

## 2011-10-22 VITALS — BP 124/72 | Temp 98.1°F | Wt 155.0 lb

## 2011-10-22 DIAGNOSIS — J069 Acute upper respiratory infection, unspecified: Secondary | ICD-10-CM | POA: Insufficient documentation

## 2011-10-22 MED ORDER — OMEPRAZOLE 20 MG PO CPDR: 20.0000 mg | DELAYED_RELEASE_CAPSULE | Freq: Two times a day (BID) | ORAL | Status: DC

## 2011-10-22 MED ORDER — PRAVASTATIN SODIUM 40 MG PO TABS: 40.0000 mg | ORAL_TABLET | Freq: Every day | ORAL | Status: DC

## 2011-10-22 MED ORDER — HYDROCHLOROTHIAZIDE 12.5 MG PO CAPS: 12.5000 mg | ORAL_CAPSULE | Freq: Every day | ORAL | Status: DC

## 2011-10-22 NOTE — Assessment & Plan Note (Signed)
66 year old white female with symptoms of viral URI. She may be developing early bronchitis. We discussed symptomatic treatment for now.  Patient advised to call office if symptoms persist or worsen. We discussed using doxycycline if her symptoms get worse.

## 2011-10-22 NOTE — Patient Instructions (Addendum)
Use mucinex DM over the counter Gargle with warm salt water and use nasal saline Please call our office if your symptoms do not improve or gets worse.

## 2011-10-22 NOTE — Progress Notes (Signed)
Subjective:    Patient ID: [name], female    DOB: [birth year], 66 y.o.   MRN: [id number]  URI  This is a new problem. The current episode started in the past 7 days. The problem has been gradually worsening. There has been no fever. Associated symptoms include congestion, coughing, sinus pain, a sore throat and wheezing. Pertinent negatives include no nausea. She has tried inhaler use for the symptoms. The treatment provided mild relief.   She has been feeling achy.  No pleuritic pains.  She is coughing up some slightly yellowish sputum.  Review of Systems  HENT: Positive for congestion and sore throat.   Respiratory: Positive for cough and wheezing.   Gastrointestinal: Negative for nausea.       Past Medical History  Diagnosis Date  . Hemorrhoids   . Fainting spell   . Ulcer, esophagus   . History of hepatitis     jaundice  . Migraines     teenager  . Depression   . GERD (gastroesophageal reflux disease)     see GI  . Headache   . Glucose intolerance (impaired glucose tolerance)   . Arthritis   . Hypertension   . Osteoarthritis   . Hyperlipemia   . IBS (irritable bowel syndrome)     poorly functioning gall bladder- sees GI  . Reactive airway disease   . Adenomatous polyp of colon     11/1996  . Hiatal hernia   . Esophageal stricture   . DJD (degenerative joint disease)     chronic right ankle pain    History   Social History  . Marital Status: Married    Spouse Name: N/A    Number of Children: N/A  . Years of Education: N/A   Occupational History  . Case Production designer, theatre/television/film / Research scientist (medical) for [employer]    Social History Main Topics  . Smoking status: Never Smoker   . Smokeless tobacco: Not on file  . Alcohol Use: Not on file  . Drug Use: Not on file  . Sexually Active: Not on file   Other Topics Concern  . 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 Tubal ligation   . Colonoscopy   . Knee arthroscopy   . Total knee arthroplasty     left knee 06/2008    Family History  Problem Relation Age of Onset  . Alcohol abuse    . Arthritis    . Breast cancer      aunt age 79  . Colon cancer    . Diabetes    . Hypertension    . Pancreatic cancer    . Stroke    . Cardiomyopathy      cardiovascular disorder    Allergies  Allergen Reactions  . Teriparatide     REACTION: hives    Current Outpatient Prescriptions on File Prior to Visit  Medication Sig Dispense Refill  . Cholecalciferol (VITAMIN D3) 2000 UNITS capsule Take 2,000 Units by mouth daily.        Marland Kitchen docusate sodium (COLACE) 100 MG capsule Take 100 mg by mouth 2 (two) times daily.       . hydrochlorothiazide (,MICROZIDE/HYDRODIURIL,) 12.5 MG capsule Take 1 capsule (12.5 mg total) by mouth daily.  90 capsule  3  . hyoscyamine (  LEVSIN, ANASPAZ) 0.125 MG tablet Take 1 tablet (0.125 mg total) by mouth every 8 (eight) hours as needed.  90 tablet  0  . Magnesium Oxide 400 MG CAPS Take by mouth daily.        . multivitamin (THERAGRAN) per tablet Take 1 tablet by mouth daily.        . Omega-3 Fatty Acids (FISH OIL) 1200 MG CAPS Take by mouth 2 (two) times daily.        Marland Kitchen omeprazole (PRILOSEC) 20 MG capsule Take 1 capsule (20 mg total) by mouth 2 (two) times daily.  60 capsule  0    BP 124/72  Temp(Src) 98.1 F (36.7 C) (Oral)  Wt 155 lb (70.308 kg)    Objective:   Physical Exam  Constitutional: She is oriented to person, place, and time. She appears well-developed and well-nourished.  HENT:  Head: Normocephalic and atraumatic.  Cardiovascular: Normal rate, regular rhythm and normal heart sounds.   Pulmonary/Chest: Effort normal and breath sounds normal. She has no wheezes. She has no rales.  Lymphadenopathy:    She has no cervical adenopathy.  Neurological: She is alert and oriented  to person, place, and time.  Skin: Skin is warm and dry.          Assessment & Plan:

## 2011-10-29 ENCOUNTER — Ambulatory Visit: Payer: 59 | Admitting: Internal Medicine

## 2011-12-21 ENCOUNTER — Telehealth: Payer: Self-pay | Admitting: Internal Medicine

## 2011-12-21 NOTE — Telephone Encounter (Signed)
Refill- levsin 0.125mg  sub schw. Dissolve one tablet under tongue every 8 hrs as needed. Qty 90 last fill 12.24.12

## 2011-12-22 MED ORDER — HYOSCYAMINE SULFATE 0.125 MG PO TABS: 0.1250 mg | ORAL_TABLET | Freq: Three times a day (TID) | ORAL | Status: DC | PRN

## 2011-12-22 NOTE — Telephone Encounter (Signed)
rx sent in electronically 

## 2011-12-27 ENCOUNTER — Encounter: Payer: 59 | Admitting: Internal Medicine

## 2012-02-02 ENCOUNTER — Ambulatory Visit (INDEPENDENT_AMBULATORY_CARE_PROVIDER_SITE_OTHER): Payer: 59 | Admitting: Internal Medicine

## 2012-02-02 ENCOUNTER — Encounter: Payer: Self-pay | Admitting: Internal Medicine

## 2012-02-02 VITALS — BP 114/78 | HR 68 | Temp 98.1°F | Ht 60.0 in | Wt 154.0 lb

## 2012-02-02 DIAGNOSIS — E739 Lactose intolerance, unspecified: Secondary | ICD-10-CM

## 2012-02-02 DIAGNOSIS — E785 Hyperlipidemia, unspecified: Secondary | ICD-10-CM

## 2012-02-02 DIAGNOSIS — I10 Essential (primary) hypertension: Secondary | ICD-10-CM

## 2012-02-02 DIAGNOSIS — L570 Actinic keratosis: Secondary | ICD-10-CM

## 2012-02-02 DIAGNOSIS — Z0000 Encounter for general adult medical examination without abnormal findings: Secondary | ICD-10-CM

## 2012-02-02 LAB — LIPID PANEL
Cholesterol: 207 mg/dL — ABNORMAL HIGH (ref 0–200)
HDL: 66.4 mg/dL (ref >39.00)
Total CHOL/HDL Ratio: 3
Triglycerides: 58 mg/dL (ref 0.0–149.0)
VLDL: 11.6 mg/dL (ref 0.0–40.0)

## 2012-02-02 LAB — CBC WITH DIFFERENTIAL/PLATELET
Basophils Absolute: 0 10*3/uL (ref 0.0–0.1)
Basophils Relative: 0.6 % (ref 0.0–3.0)
Eosinophils Absolute: 0.1 10*3/uL (ref 0.0–0.7)
Eosinophils Relative: 1.1 % (ref 0.0–5.0)
HCT: 43.3 % (ref 36.0–46.0)
Hemoglobin: 14.9 g/dL (ref 12.0–15.0)
Lymphocytes Relative: 38 % (ref 12.0–46.0)
Lymphs Abs: 2.6 10*3/uL (ref 0.7–4.0)
MCHC: 34.4 g/dL (ref 30.0–36.0)
MCV: 88 fl (ref 78.0–100.0)
Monocytes Absolute: 0.5 10*3/uL (ref 0.1–1.0)
Monocytes Relative: 6.5 % (ref 3.0–12.0)
Neutro Abs: 3.7 10*3/uL (ref 1.4–7.7)
Neutrophils Relative %: 53.8 % (ref 43.0–77.0)
Platelets: 203 10*3/uL (ref 150.0–400.0)
RBC: 4.93 Mil/uL (ref 3.87–5.11)
RDW: 12.5 % (ref 11.5–14.6)
WBC: 7 10*3/uL (ref 4.5–10.5)

## 2012-02-02 LAB — HEPATIC FUNCTION PANEL
ALT: 17 U/L (ref 0–35)
AST: 19 U/L (ref 0–37)
Albumin: 4.2 g/dL (ref 3.5–5.2)
Alkaline Phosphatase: 47 U/L (ref 39–117)
Bilirubin, Direct: 0.1 mg/dL (ref 0.0–0.3)
Total Bilirubin: 0.7 mg/dL (ref 0.3–1.2)
Total Protein: 7 g/dL (ref 6.0–8.3)

## 2012-02-02 LAB — BASIC METABOLIC PANEL
BUN: 14 mg/dL (ref 6–23)
CO2: 30 mEq/L (ref 19–32)
Calcium: 9.6 mg/dL (ref 8.4–10.5)
Chloride: 100 mEq/L (ref 96–112)
Creatinine, Ser: 0.7 mg/dL (ref 0.4–1.2)
GFR: 85.1 mL/min (ref >60.00)
Glucose, Bld: 79 mg/dL (ref 70–99)
Potassium: 3.4 mEq/L — ABNORMAL LOW (ref 3.5–5.1)
Sodium: 139 mEq/L (ref 135–145)

## 2012-02-02 LAB — HEMOGLOBIN A1C: Hgb A1c MFr Bld: 5.8 % (ref 4.6–6.5)

## 2012-02-02 NOTE — Assessment & Plan Note (Signed)
Stable. No change in medications. BP: 114/78 mmHg  Monitor BMET

## 2012-02-02 NOTE — Progress Notes (Signed)
Subjective:    Patient ID: [name], female    DOB: [birth year], 66 y.o.   MRN: [id number]  HPI  66 year old white female with history of hypertension and hyperlipidemia for routine physical. Patient denies significant interval medical history. She was able to get over her upper respiratory infection from March 2013.  The patient's husband was diagnosed with bladder cancer. He had BCG treatments.    Patient had colonoscopy in 2009. She is due for mammogram this month. She denies any breast complaints.  She exercises 5 days per week. She notes intermittent palpitations. No associated shortness of breath or chest pain.  Review of Systems   Constitutional: Negative for activity change, appetite change and unexpected weight change.  Eyes: Negative for visual disturbance.  Respiratory: Negative for cough, chest tightness and shortness of breath.   Cardiovascular: Negative for chest pain.  Genitourinary: Negative for difficulty urinating.  Neurological: Negative for headaches.  Gastrointestinal: Negative for abdominal pain, heartburn melena or hematochezia Psych: Negative for depression or anxiety Endo:  Vaginal dryness Skin: Rough raised area over right upper buttock Neuro: occasional dizziness with changing head positions  Past Medical History  Diagnosis Date  . Hemorrhoids   . Fainting spell   . Ulcer, esophagus   . History of hepatitis     jaundice  . Migraines     teenager  . Depression   . GERD (gastroesophageal reflux disease)     see GI  . Headache   . Glucose intolerance (impaired glucose tolerance)   . Arthritis   . Hypertension   . Osteoarthritis   . Hyperlipemia   . IBS (irritable bowel syndrome)     poorly functioning gall bladder- sees GI  . Reactive airway disease   . Adenomatous polyp of colon     11/1996  . Hiatal hernia   . Esophageal stricture   . DJD (degenerative joint disease)     chronic right ankle pain    History   Social History  .  Marital Status: Married    Spouse Name: N/A    Number of Children: N/A  . Years of Education: N/A   Occupational History  . Case Production designer, theatre/television/film / Research scientist (medical) for [employer]    Social History Main Topics  . Smoking status: Never Smoker   . Smokeless tobacco: Not on file  . Alcohol Use: Not on file  . Drug Use: Not on file  . Sexually Active: Not on file   Other Topics Concern  . 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 Tubal ligation   . Colonoscopy   . Knee arthroscopy   . Total knee arthroplasty     left knee 06/2008    Family History  Problem Relation Age of Onset  . Alcohol abuse    . Arthritis    . Breast cancer      aunt age 70  . Colon cancer    . Diabetes    . Hypertension    . Pancreatic cancer    . Stroke    . Cardiomyopathy      cardiovascular disorder    Allergies  Allergen Reactions  . Teriparatide     REACTION: hives    Current Outpatient Prescriptions on File Prior to Visit  Medication Sig Dispense Refill  . Cholecalciferol (VITAMIN D3) 2000 UNITS capsule Take 2,000 Units by mouth daily.        Marland Kitchen docusate sodium (COLACE) 100 MG capsule Take 100 mg by mouth 2 (two) times daily.       . hydrochlorothiazide (MICROZIDE) 12.5 MG capsule Take 1 capsule (12.5 mg total) by mouth daily.  90 capsule  1  . hyoscyamine (LEVSIN, ANASPAZ) 0.125 MG tablet Take 1 tablet (0.125 mg total) by mouth every 8 (eight) hours as needed.  90 tablet  1  . Magnesium Oxide 400 MG CAPS Take by mouth daily.        . multivitamin (THERAGRAN) per tablet Take 1 tablet by mouth daily.        . Omega-3 Fatty Acids (FISH OIL) 1200 MG CAPS Take by mouth 2 (two) times daily.        Marland Kitchen omeprazole (PRILOSEC) 20 MG capsule Take 1 capsule (20 mg total) by mouth  2 (two) times daily.  180 capsule  1  . pravastatin (PRAVACHOL) 40 MG tablet Take 1 tablet (40 mg total) by mouth daily.  90 tablet  1    BP 114/78  Pulse 68  Temp(Src) 98.1 F (36.7 C) (Oral)  Ht 5' (1.524 m)  Wt 154 lb (69.854 kg)  BMI 30.08 kg/m2     Objective:   Physical Exam  Constitutional: She is oriented to person, place, and time. She appears well-developed and well-nourished. No distress.  HENT:  Head: Normocephalic and atraumatic.  Right Ear: External ear normal.  Left Ear: External ear normal.  Mouth/Throat: Oropharynx is clear and moist.  Eyes: Conjunctivae and EOM are normal. Pupils are equal, round, and reactive to light.  Neck: Normal range of motion. Neck supple.  Cardiovascular: Normal rate, regular rhythm and normal heart sounds.   No murmur heard. Pulmonary/Chest: Effort normal and breath sounds normal. She has no wheezes. She has no rales.  Abdominal: Soft. Bowel sounds are normal. She exhibits no mass. There is no tenderness.  Musculoskeletal: Normal range of motion. She exhibits no edema and no tenderness.  Lymphadenopathy:    She has no cervical adenopathy.  Neurological: She is alert and oriented to person, place, and time. No cranial nerve deficit. She exhibits normal muscle tone. Coordination normal.  Skin: Skin is warm and dry.  Psychiatric: She has a normal mood and affect. Her behavior is normal.       Assessment & Plan:

## 2012-02-02 NOTE — Assessment & Plan Note (Signed)
1 cm scaly raised area on right upper buttock.  Area treated with liquid nitrogen.  Aftercare discussed.  Patient advised to report if lesion returns.

## 2012-02-02 NOTE — Assessment & Plan Note (Signed)
She notes occasional issues with medication compliance.  She is not sure if occasional muscle aches related to statin.  Patient will try taking 1/2 dose or pravastatin 20 mg.   Pt will also try taking CoQ 10 over the counter.

## 2012-02-02 NOTE — Assessment & Plan Note (Signed)
Reviewed adult health maintenance protocols.  Breast exam normal.  Pt scheduled for mammogram.  She is up to date with colonoscopy.  Defer PAP / Pelvic until next year.

## 2012-02-03 LAB — POCT URINALYSIS DIPSTICK
Bilirubin, UA: NEGATIVE
Blood, UA: NEGATIVE
Glucose, UA: NEGATIVE
Ketones, UA: NEGATIVE
Leukocytes, UA: NEGATIVE
Nitrite, UA: NEGATIVE
Protein, UA: NEGATIVE
Spec Grav, UA: 1.01
Urobilinogen, UA: 0.2
pH, UA: 7

## 2012-02-03 LAB — LDL CHOLESTEROL, DIRECT: Direct LDL: 128.7 mg/dL

## 2012-02-03 NOTE — Progress Notes (Signed)
Addended by: Serena Colonel on: 02/03/2012 08:33 AM   Modules accepted: Orders

## 2012-02-04 ENCOUNTER — Encounter: Payer: Self-pay | Admitting: Internal Medicine

## 2012-02-08 ENCOUNTER — Encounter: Payer: Self-pay | Admitting: Internal Medicine

## 2012-03-17 ENCOUNTER — Other Ambulatory Visit: Payer: 59

## 2012-03-17 ENCOUNTER — Telehealth: Payer: Self-pay | Admitting: Internal Medicine

## 2012-03-17 NOTE — Telephone Encounter (Signed)
Pt called re: charges from a mole removal on 02/02/12. Pt is questioning the amount charged. Req call back asap.

## 2012-03-20 ENCOUNTER — Other Ambulatory Visit (INDEPENDENT_AMBULATORY_CARE_PROVIDER_SITE_OTHER): Payer: 59

## 2012-03-20 DIAGNOSIS — E876 Hypokalemia: Secondary | ICD-10-CM

## 2012-03-20 LAB — BASIC METABOLIC PANEL
BUN: 17 mg/dL (ref 6–23)
CO2: 29 mEq/L (ref 19–32)
Calcium: 9.1 mg/dL (ref 8.4–10.5)
Chloride: 102 mEq/L (ref 96–112)
Creatinine, Ser: 1 mg/dL (ref 0.4–1.2)
GFR: 59.85 mL/min — ABNORMAL LOW (ref >60.00)
Glucose, Bld: 74 mg/dL (ref 70–99)
Potassium: 4.1 mEq/L (ref 3.5–5.1)
Sodium: 139 mEq/L (ref 135–145)

## 2012-03-27 ENCOUNTER — Telehealth: Payer: Self-pay | Admitting: Internal Medicine

## 2012-03-27 NOTE — Telephone Encounter (Signed)
Pt called and has sch ov to see Dr Artist Pais on 03/28/12 at 3pm re: Knee Injury and FMLA paperwork. Pt req to get worked in to see Dr Artist Pais today.

## 2012-03-27 NOTE — Telephone Encounter (Signed)
No where to put pt on the schedule today, pt aware

## 2012-03-28 ENCOUNTER — Ambulatory Visit (INDEPENDENT_AMBULATORY_CARE_PROVIDER_SITE_OTHER): Payer: 59 | Admitting: Internal Medicine

## 2012-03-28 ENCOUNTER — Encounter (HOSPITAL_BASED_OUTPATIENT_CLINIC_OR_DEPARTMENT_OTHER): Payer: Self-pay | Admitting: *Deleted

## 2012-03-28 VITALS — BP 120/80 | Temp 98.3°F | Wt 157.0 lb

## 2012-03-28 DIAGNOSIS — M25569 Pain in unspecified knee: Secondary | ICD-10-CM

## 2012-03-28 DIAGNOSIS — M25561 Pain in right knee: Secondary | ICD-10-CM

## 2012-03-28 MED ORDER — TRAMADOL HCL 50 MG PO TABS: 50.0000 mg | ORAL_TABLET | Freq: Three times a day (TID) | ORAL | Status: DC | PRN

## 2012-03-28 MED ORDER — HYDROMORPHONE HCL 2 MG PO TABS: ORAL_TABLET | ORAL | Status: DC

## 2012-03-28 MED ORDER — ALPRAZOLAM 0.25 MG PO TABS: 0.2500 mg | ORAL_TABLET | Freq: Every day | ORAL | Status: AC | PRN

## 2012-03-28 NOTE — Patient Instructions (Addendum)
Stop taking muscle relaxer and hydrocodone. Use tramadol 50 mg and 500 mg of tylenol 3 x day as needed for pain. Only use Dilaudid 2 mg if you have breakthrough pain despite using tramadol
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
## 2012-03-28 NOTE — Assessment & Plan Note (Signed)
66 year old white female experiencing significant right knee pain secondary to complex tear of her medial meniscus. She was already evaluated by orthopedic specialist. She is trying to schedule surgery within one week. Her current pain medications are not holding her symptoms. Discontinue hydrocodone to due to GI side effects.  Use tramadol 3 times a day with 500 mg of acetaminophen. Prescription for Dilaudi 2 mg to be used for breakthrough pain provided.

## 2012-03-29 ENCOUNTER — Encounter (HOSPITAL_BASED_OUTPATIENT_CLINIC_OR_DEPARTMENT_OTHER): Payer: Self-pay | Admitting: *Deleted

## 2012-03-29 NOTE — Progress Notes (Signed)
NPO AFTER MN WITH EXCEPTION CLEAR LIQUIDS UNTIL 0730 (NO CREAM/ MILK PRODUCTS). ARRIVES AT 1130. NEEDS ISTAT AND EKG. WILL TAKE TRAMADOL , TYLENOL, AND PRILOSEC AM OF SURG W/ SIP OF WATER.

## 2012-03-31 ENCOUNTER — Encounter (HOSPITAL_BASED_OUTPATIENT_CLINIC_OR_DEPARTMENT_OTHER)
Admission: RE | Disposition: A | Discharge status: Home / Self Care | Payer: Self-pay | Source: Ambulatory Visit | Attending: Orthopedic Surgery

## 2012-03-31 ENCOUNTER — Encounter (HOSPITAL_BASED_OUTPATIENT_CLINIC_OR_DEPARTMENT_OTHER): Payer: Self-pay | Admitting: Anesthesiology

## 2012-03-31 ENCOUNTER — Ambulatory Visit (HOSPITAL_BASED_OUTPATIENT_CLINIC_OR_DEPARTMENT_OTHER)
Admission: RE | Admit: 2012-03-31 | Discharge: 2012-03-31 | Disposition: A | Discharge status: Home / Self Care | Payer: 59 | Source: Ambulatory Visit | Attending: Orthopedic Surgery | Admitting: Orthopedic Surgery

## 2012-03-31 ENCOUNTER — Encounter (HOSPITAL_BASED_OUTPATIENT_CLINIC_OR_DEPARTMENT_OTHER): Payer: Self-pay | Admitting: *Deleted

## 2012-03-31 ENCOUNTER — Ambulatory Visit (HOSPITAL_BASED_OUTPATIENT_CLINIC_OR_DEPARTMENT_OTHER): Payer: 59 | Admitting: Anesthesiology

## 2012-03-31 DIAGNOSIS — I10 Essential (primary) hypertension: Secondary | ICD-10-CM | POA: Insufficient documentation

## 2012-03-31 DIAGNOSIS — S83289A Other tear of lateral meniscus, current injury, unspecified knee, initial encounter: Secondary | ICD-10-CM | POA: Insufficient documentation

## 2012-03-31 DIAGNOSIS — Z96659 Presence of unspecified artificial knee joint: Secondary | ICD-10-CM | POA: Insufficient documentation

## 2012-03-31 DIAGNOSIS — K219 Gastro-esophageal reflux disease without esophagitis: Secondary | ICD-10-CM | POA: Insufficient documentation

## 2012-03-31 DIAGNOSIS — E785 Hyperlipidemia, unspecified: Secondary | ICD-10-CM | POA: Insufficient documentation

## 2012-03-31 DIAGNOSIS — X58XXXA Exposure to other specified factors, initial encounter: Secondary | ICD-10-CM | POA: Insufficient documentation

## 2012-03-31 DIAGNOSIS — Z79899 Other long term (current) drug therapy: Secondary | ICD-10-CM | POA: Insufficient documentation

## 2012-03-31 DIAGNOSIS — M1710 Unilateral primary osteoarthritis, unspecified knee: Secondary | ICD-10-CM | POA: Insufficient documentation

## 2012-03-31 DIAGNOSIS — Z9889 Other specified postprocedural states: Secondary | ICD-10-CM

## 2012-03-31 DIAGNOSIS — IMO0002 Reserved for concepts with insufficient information to code with codable children: Secondary | ICD-10-CM | POA: Insufficient documentation

## 2012-03-31 HISTORY — DX: Other cervical disc degeneration, unspecified cervical region: M50.30

## 2012-03-31 HISTORY — PX: KNEE ARTHROSCOPY: SHX127

## 2012-03-31 HISTORY — DX: Unspecified tear of unspecified meniscus, current injury, unspecified knee, initial encounter: S83.209A

## 2012-03-31 HISTORY — DX: Irritable bowel syndrome, unspecified: K58.9

## 2012-03-31 HISTORY — DX: Personal history of other diseases of the digestive system: Z87.19

## 2012-03-31 HISTORY — DX: Primary osteoarthritis, unspecified ankle and foot: M19.079

## 2012-03-31 HISTORY — DX: Effusion, right knee: M25.461

## 2012-03-31 HISTORY — DX: Personal history of other specified conditions: Z87.898

## 2012-03-31 HISTORY — DX: Anxiety disorder, unspecified: F41.9

## 2012-03-31 LAB — POCT I-STAT 4, (NA,K, GLUC, HGB,HCT)
Glucose, Bld: 85 mg/dL (ref 70–99)
HCT: 43 % (ref 36.0–46.0)
Hemoglobin: 14.6 g/dL (ref 12.0–15.0)
Potassium: 3.6 mEq/L (ref 3.5–5.1)
Sodium: 142 mEq/L (ref 135–145)

## 2012-03-31 SURGERY — Surgical Case
Anesthesia: *Unknown

## 2012-03-31 SURGERY — ARTHROSCOPY, KNEE
Anesthesia: Spinal | Site: Knee | Laterality: Right

## 2012-03-31 MED ORDER — HYDROMORPHONE HCL PF 1 MG/ML IJ SOLN: 0.2500 mg | INTRAMUSCULAR | Status: DC | PRN

## 2012-03-31 MED ORDER — PROPOFOL 10 MG/ML IV EMUL
INTRAVENOUS | Status: DC | PRN
  Administered 2012-03-31: 75 ug/kg/min via INTRAVENOUS

## 2012-03-31 MED ORDER — CEFAZOLIN SODIUM 1-5 GM-% IV SOLN
INTRAVENOUS | Status: DC | PRN
  Administered 2012-03-31: 1 g via INTRAVENOUS

## 2012-03-31 MED ORDER — ACETAMINOPHEN 10 MG/ML IV SOLN
1000.0000 mg | Freq: Four times a day (QID) | INTRAVENOUS | Status: DC
  Administered 2012-03-31: 1000 mg via INTRAVENOUS

## 2012-03-31 MED ORDER — LIDOCAINE IN DEXTROSE 5-7.5 % IV SOLN
INTRAVENOUS | Status: DC | PRN
  Administered 2012-03-31: 1.2 mL via INTRATHECAL

## 2012-03-31 MED ORDER — ACETAMINOPHEN 10 MG/ML IV SOLN: 1000.0000 mg | Freq: Once | INTRAVENOUS | Status: DC

## 2012-03-31 MED ORDER — MORPHINE SULFATE 4 MG/ML IJ SOLN
INTRAMUSCULAR | Status: DC | PRN
  Administered 2012-03-31: 4 mg via INTRAVENOUS

## 2012-03-31 MED ORDER — BUPIVACAINE-EPINEPHRINE 0.5% -1:200000 IJ SOLN
INTRAMUSCULAR | Status: DC | PRN
  Administered 2012-03-31: 20 mL

## 2012-03-31 MED ORDER — LIDOCAINE HCL (CARDIAC) 20 MG/ML IV SOLN
INTRAVENOUS | Status: DC | PRN
  Administered 2012-03-31: 50 mg via INTRAVENOUS

## 2012-03-31 MED ORDER — HYDROMORPHONE HCL 2 MG PO TABS
2.0000 mg | ORAL_TABLET | Freq: Once | ORAL | Status: AC
  Administered 2012-03-31: 2 mg via ORAL

## 2012-03-31 MED ORDER — FENTANYL CITRATE 0.05 MG/ML IJ SOLN
INTRAMUSCULAR | Status: DC | PRN
  Administered 2012-03-31: 25 ug via INTRAVENOUS
  Administered 2012-03-31 (×2): 50 ug via INTRAVENOUS
  Administered 2012-03-31 (×3): 25 ug via INTRAVENOUS
  Administered 2012-03-31: 50 ug via INTRAVENOUS
  Administered 2012-03-31 (×2): 25 ug via INTRAVENOUS

## 2012-03-31 MED ORDER — CEFAZOLIN SODIUM 1-5 GM-% IV SOLN: 1.0000 g | Freq: Once | INTRAVENOUS | Status: DC

## 2012-03-31 MED ORDER — LACTATED RINGERS IV SOLN
INTRAVENOUS | Status: DC
  Administered 2012-03-31 (×2): via INTRAVENOUS

## 2012-03-31 MED ORDER — EPINEPHRINE HCL 1 MG/ML IJ SOLN
INTRAMUSCULAR | Status: DC | PRN
  Administered 2012-03-31: 2 mg

## 2012-03-31 MED ORDER — KETOROLAC TROMETHAMINE 30 MG/ML IJ SOLN
INTRAMUSCULAR | Status: DC | PRN
  Administered 2012-03-31: 30 mg via INTRAVENOUS

## 2012-03-31 MED ORDER — PROPOFOL 10 MG/ML IV EMUL
INTRAVENOUS | Status: DC | PRN
  Administered 2012-03-31: 30 mg via INTRAVENOUS

## 2012-03-31 MED ORDER — LACTATED RINGERS IV SOLN
INTRAVENOUS | Status: DC | PRN
  Administered 2012-03-31 (×2): via INTRAVENOUS

## 2012-03-31 MED ORDER — PROMETHAZINE HCL 25 MG/ML IJ SOLN: 6.2500 mg | INTRAMUSCULAR | Status: DC | PRN

## 2012-03-31 MED ORDER — DEXAMETHASONE SODIUM PHOSPHATE 4 MG/ML IJ SOLN
INTRAMUSCULAR | Status: DC | PRN
  Administered 2012-03-31: 4 mg via INTRAVENOUS

## 2012-03-31 MED ORDER — CEPHALEXIN 500 MG PO CAPS: 500.0000 mg | ORAL_CAPSULE | Freq: Four times a day (QID) | ORAL | Status: AC

## 2012-03-31 MED ORDER — ONDANSETRON HCL 4 MG/2ML IJ SOLN
INTRAMUSCULAR | Status: DC | PRN
  Administered 2012-03-31: 4 mg via INTRAVENOUS

## 2012-03-31 MED ORDER — SODIUM CHLORIDE 0.9 % IR SOLN
Status: DC | PRN
  Administered 2012-03-31: 27000 mL

## 2012-03-31 SURGICAL SUPPLY — 45 items
BANDAGE ELASTIC 6 VELCRO ST LF (GAUZE/BANDAGES/DRESSINGS) ×2 IMPLANT
BANDAGE ESMARK 6X9 LF (GAUZE/BANDAGES/DRESSINGS) ×1 IMPLANT
BANDAGE GAUZE ELAST BULKY 4 IN (GAUZE/BANDAGES/DRESSINGS) ×2 IMPLANT
BLADE 4.2CUDA (BLADE) IMPLANT
BLADE CUDA 5.5 (BLADE) IMPLANT
BLADE CUDA SHAVER 3.5 (BLADE) ×2 IMPLANT
BLADE CUTTER GATOR 3.5 (BLADE) IMPLANT
BLADE GREAT WHITE 4.2 (BLADE) IMPLANT
BNDG ESMARK 6X9 LF (GAUZE/BANDAGES/DRESSINGS) ×2
CANISTER SUCT LVC 12 LTR MEDI- (MISCELLANEOUS) ×4 IMPLANT
CANISTER SUCTION 1200CC (MISCELLANEOUS) ×2 IMPLANT
CLOTH BEACON ORANGE TIMEOUT ST (SAFETY) ×2 IMPLANT
DRAPE ARTHROSCOPY W/POUCH 114 (DRAPES) ×2 IMPLANT
DRAPE LG THREE QUARTER DISP (DRAPES) ×2 IMPLANT
DRSG EMULSION OIL 3X3 NADH (GAUZE/BANDAGES/DRESSINGS) ×2 IMPLANT
DRSG PAD ABDOMINAL 8X10 ST (GAUZE/BANDAGES/DRESSINGS) ×2 IMPLANT
DURAPREP 26ML APPLICATOR (WOUND CARE) ×2 IMPLANT
ELECT MENISCUS 165MM 90D (ELECTRODE) IMPLANT
GLOVE BIOGEL PI IND STRL 8 (GLOVE) ×1 IMPLANT
GLOVE BIOGEL PI INDICATOR 8 (GLOVE) ×1
GLOVE ECLIPSE 8.0 STRL XLNG CF (GLOVE) ×4 IMPLANT
GLOVE INDICATOR 8.0 STRL GRN (GLOVE) ×4 IMPLANT
GOWN PREVENTION PLUS LG XLONG (DISPOSABLE) ×2 IMPLANT
GOWN STRL REIN XL XLG (GOWN DISPOSABLE) ×2 IMPLANT
IV NS IRRIG 3000ML ARTHROMATIC (IV SOLUTION) ×18 IMPLANT
KNEE WRAP E Z 3 GEL PACK (MISCELLANEOUS) ×2 IMPLANT
NDL SAFETY ECLIPSE 18X1.5 (NEEDLE) ×1 IMPLANT
NEEDLE HYPO 18GX1.5 BLUNT FILL (NEEDLE) ×2 IMPLANT
NEEDLE HYPO 18GX1.5 SHARP (NEEDLE) ×1
PACK ARTHROSCOPY DSU (CUSTOM PROCEDURE TRAY) ×2 IMPLANT
PACK BASIN DAY SURGERY FS (CUSTOM PROCEDURE TRAY) ×2 IMPLANT
PADDING CAST ABS 4INX4YD NS (CAST SUPPLIES) ×1
PADDING CAST ABS COTTON 4X4 ST (CAST SUPPLIES) ×1 IMPLANT
PENCIL BUTTON HOLSTER BLD 10FT (ELECTRODE) IMPLANT
SET ARTHROSCOPY TUBING (MISCELLANEOUS) ×1
SET ARTHROSCOPY TUBING LN (MISCELLANEOUS) ×1 IMPLANT
SPONGE GAUZE 4X4 12PLY (GAUZE/BANDAGES/DRESSINGS) ×2 IMPLANT
SUT ETHIBOND 2 OS 4 DA (SUTURE) IMPLANT
SUT ETHILON 4 0 PS 2 18 (SUTURE) ×2 IMPLANT
SUT VIC AB 0 CT1 36 (SUTURE) IMPLANT
SUT VIC AB 2-0 PS2 27 (SUTURE) IMPLANT
SYRINGE 10CC LL (SYRINGE) ×2 IMPLANT
TOWEL OR 17X24 6PK STRL BLUE (TOWEL DISPOSABLE) ×2 IMPLANT
WAND 90 DEG TURBOVAC W/CORD (SURGICAL WAND) ×2 IMPLANT
WATER STERILE IRR 500ML POUR (IV SOLUTION) ×2 IMPLANT

## 2012-03-31 NOTE — Transfer of Care (Signed)
Immediate Anesthesia Transfer of Care Note Immediate Anesthesia Transfer of Care Note  Patient: [name]  Procedure(s) Performed: Procedure(s) (LRB): ARTHROSCOPY KNEE (Right)  Patient Location: PACU  Anesthesia Type: General  Level of Consciousness: awake, sedated, patient cooperative and responds to stimulation  Airway & Oxygen Therapy: Patient Spontanous Breathing and Patient connected to face mask oxygen  Post-op Assessment: Report given to PACU RN, Post -op Vital signs reviewed and stable and Patient moving all extremities  Post vital signs: Reviewed and stable  Complications: No apparent anesthesia complications

## 2012-03-31 NOTE — Anesthesia Preprocedure Evaluation (Addendum)
Anesthesia Evaluation  Patient identified by MRN, date of birth, ID band Patient awake    Reviewed: Allergy & Precautions, H&P , NPO status , Patient's Chart, lab work & pertinent test results  Airway Mallampati: II TM Distance: >3 FB Neck ROM: Full    Dental No notable dental hx.    Pulmonary neg pulmonary ROS,  breath sounds clear to auscultation  Pulmonary exam normal       Cardiovascular hypertension, Pt. on medications negative cardio ROS  Rhythm:Regular Rate:Normal     Neuro/Psych negative neurological ROS  negative psych ROS   GI/Hepatic negative GI ROS, Neg liver ROS, GERD-  Medicated and Controlled,  Endo/Other  negative endocrine ROS  Renal/GU negative Renal ROS  negative genitourinary   Musculoskeletal negative musculoskeletal ROS (+)   Abdominal   Peds negative pediatric ROS (+)  Hematology negative hematology ROS (+)   Anesthesia Other Findings Upper front bridge and caps   Reproductive/Obstetrics negative OB ROS                           Anesthesia Physical Anesthesia Plan  ASA: II  Anesthesia Plan: Spinal   Post-op Pain Management:    Induction:   Airway Management Planned:   Additional Equipment:   Intra-op Plan:   Post-operative Plan: Extubation in OR  Informed Consent: I have reviewed the patients History and Physical, chart, labs and discussed the procedure including the risks, benefits and alternatives for the proposed anesthesia with the patient or authorized representative who has indicated his/her understanding and acceptance.   Dental advisory given  Plan Discussed with: CRNA  Anesthesia Plan Comments:         Anesthesia Quick Evaluation

## 2012-03-31 NOTE — Anesthesia Procedure Notes (Addendum)
Spinal  Patient location during procedure: OR Staffing Performed by: anesthesiologist  Preanesthetic Checklist Completed: patient identified, site marked, surgical consent, pre-op evaluation, timeout performed, IV checked, risks and benefits discussed and monitors and equipment checked Spinal Block Patient position: sitting Prep: Betadine Patient monitoring: heart rate, continuous pulse ox and blood pressure Injection technique: single-shot Needle Needle type: Spinocan  Needle gauge: 22 G Needle length: 9 cm Additional Notes Expiration date of kit checked and confirmed. Patient tolerated procedure well, without complications.    Procedure Name: MAC Date/Time: 03/31/2012 1:55 PM Performed by: Jessica Priest Pre-anesthesia Checklist: Patient identified, Timeout performed, Emergency Drugs available, Suction available and Patient being monitored Patient Re-evaluated:Patient Re-evaluated prior to inductionOxygen Delivery Method: Simple face mask Preoxygenation: Pre-oxygenation with 100% oxygen

## 2012-03-31 NOTE — Discharge Instructions (Signed)
  Spinal Anesthesia Care After HOME CARE INSTRUCTIONS  Do not drive or operate machinery for at least 24 hours after receiving anesthesia. Make sure someone is available to drive you home.   Do not drink alcohol for at least 24 hours after receiving anesthesia.   Do not make important decisions for 24 hours after having spinal or epidural anesthesia. Your thinking may be unclear.   Have someone stay with you for at least 24 to 48 hours following surgery.   Drink lots of fluids when you get home. If you are an adult, drink eight, 8 ounce glasses of water per day, or as directed.   Keep your post-operative appointments as suggested.  SEEK IMMEDIATE MEDICAL CARE IF:   You develop a fever or any temperature over 100.4 F (38 C).   You have a persistent or severe headache.   You develop blurred or double vision.   You develop dizziness, fainting or lightheadedness.   You have weakness, numbness, or tingling in your arms or legs.   You develop a skin rash.   You have difficulty breathing.   You have persistent nausea and vomiting.   You are unable to pass urine.  Document Released: 12/18/2003 Document Revised: 09/16/2011 Document Reviewed: 10/31/2007 [facility] Patient Information 2012 [place], [place].
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
## 2012-03-31 NOTE — Anesthesia Postprocedure Evaluation (Signed)
  Anesthesia Post-op Note  Patient: [name]  Procedure(s) Performed: Procedure(s) (LRB): ARTHROSCOPY KNEE (Right)  Patient Location: PACU  Anesthesia Type: General  Level of Consciousness: awake and alert   Airway and Oxygen Therapy: Patient Spontanous Breathing  Post-op Pain: mild  Post-op Assessment: Post-op Vital signs reviewed, Patient's Cardiovascular Status Stable, Respiratory Function Stable, Patent Airway and No signs of Nausea or vomiting  Post-op Vital Signs: stable  Complications: No apparent anesthesia complications

## 2012-03-31 NOTE — Brief Op Note (Signed)
03/31/2012  3:51 PM  PATIENT:  [name]  66 y.o. female  PRE-OPERATIVE DIAGNOSIS:  rt knee torn medial and lateral meniscus  POST-OPERATIVE DIAGNOSIS:  right knee torn medial and lateral meniscus  PROCEDURE:  Procedure(s) (LRB): ARTHROSCOPY KNEE (Right) with partial medial and lateral meniscectomies  SURGEON:  Surgeon(s) and Role:    * Drucilla Schmidt, MD - Primary  PHYSICIAN ASSISTANT:   ASSISTANTS: nurse   ANESTHESIA:   spinal  EBL:  Total I/O In: 1000 [I.V.:1000] Out: -   BLOOD ADMINISTERED:none  DRAINS: none   LOCAL MEDICATIONS USED:  MARCAINE     SPECIMEN:  No Specimen  DISPOSITION OF SPECIMEN:  N/A  COUNTS:  YES  TOURNIQUET:   Total Tourniquet Time Documented: Thigh (Right) - 101 minutes  DICTATION: .Other Dictation: Dictation Number [id number]  PLAN OF CARE: Discharge to home after PACU  PATIENT DISPOSITION:  PACU - hemodynamically stable.   Delay start of Pharmacological VTE agent (>24hrs) due to surgical blood loss or risk of bleeding: yes

## 2012-04-03 ENCOUNTER — Encounter (HOSPITAL_BASED_OUTPATIENT_CLINIC_OR_DEPARTMENT_OTHER): Payer: Self-pay | Admitting: Orthopedic Surgery

## 2012-04-03 NOTE — Op Note (Signed)
[name], LIST NO.:  [id number]  MEDICAL RECORD NO.:  [id number]  LOCATION:                               FACILITY:  [facility]  PHYSICIAN:  Marlowe Kays, M.D.  DATE OF BIRTH:  1946/05/01  DATE OF PROCEDURE:  03/31/2012 DATE OF DISCHARGE:                              OPERATIVE REPORT   PREOPERATIVE DIAGNOSES: 1. Torn medial and lateral menisci. 2. Osteoarthritis, right knee.  POSTOPERATIVE DIAGNOSES: 1. Torn medial and lateral menisci. 2. Osteoarthritis, right knee.  OPERATION:  Right knee arthroscopy with partial medial and lateral meniscectomies.  SURGEON:  Marlowe Kays, M.D.  ASSISTANT:  Nurse.  ANESTHESIA:  General.  PATHOLOGY AND JUSTIFICATION FOR PROCEDURE:  She has had a prior patellectomy which made the surgery more difficult because of scar.  MRI demonstrated significant tearing of the medial meniscus and surface fraying of the lateral meniscus with these findings confirmed to have surgery as noted above.  She also was noted to have osteoarthritis of the knee and had one area of full-thickness wear in the medial femoral condyle.  PROCEDURE IN DETAIL:  Prophylactic antibiotics due to total knee replacement on the left, satisfactory spinal anesthesia, she had a spinal at her request.  Left knee was wrapped in Ace wrap and knee support.  In the right knee, pneumatic tourniquet was applied and inflated to 300 mmHg initially, and then up to 325 after S marking out the leg nonsterilely.  Thigh stabilizer was applied.  The right leg was then prepped with DuraPrep from thigh stabilizer to ankle and draped in sterile field.  Time-out performed.  Superior and medial saline inflow. First, an anterolateral portal medial compartment knee joint was evaluated.  It was more difficult arthroscopy than normal because of scar.  The anterior meniscus and perhaps this was mainly scar, was deformed and was resected with a 3.5 shaver.  Posteriorly, she  had a large almost bucket-handle type flap tear beginning in the mid portion and extending all the way into the intercondylar area.  I excised this with a combination of instruments including arthroscopic scissors, baskets, and the 3.5 shaver.  On using the Shutt basket in the intercondylar area, one of the wings of it at the nozzle broke off.  The portion I was able to retrieve this with the nerve hook and pituitary rongeur.  I matched the piece that we retrieved and it just appeared to be 1 fragment that came off to the apparent Shutt basket and this appeared to match out perfectly.  Also on further looking at the joint, no other metallic fragments were noted.  After performing basically a subtotal medial meniscectomy because of the extent of the tear, I then reversed portals.  Laterally, she had significant serration of the inner border of the lateral meniscus throughout most of its extent and disruption of the anterior third, all of which I shaved back to a stable rim.  Final pictures were taken.  Knee joint was then irrigated to clear and all fluid possible was removed.  I closed the 2 ends of the portals with 4-0 nylon and then injected through the inflow apparatus 20 mL 0.5% Marcaine with adrenaline 4  mg of morphine and this portal was closed with 4-0 nylon as well.  Betadine, Adaptic, and dry sterile dressing were applied with tourniquet being released.  She tolerated the procedure well, was taken to the recovery room in satisfactory condition with no known complications.          ______________________________ Marlowe Kays, M.D.     JA/MEDQ  D:  03/31/2012  T:  04/01/2012  Job:  [id number]

## 2012-04-06 ENCOUNTER — Encounter (HOSPITAL_BASED_OUTPATIENT_CLINIC_OR_DEPARTMENT_OTHER): Payer: Self-pay

## 2012-04-07 ENCOUNTER — Telehealth: Payer: Self-pay | Admitting: Internal Medicine

## 2012-04-07 MED ORDER — OMEPRAZOLE 20 MG PO CPDR: 20.0000 mg | DELAYED_RELEASE_CAPSULE | Freq: Two times a day (BID) | ORAL | Status: DC

## 2012-04-07 NOTE — Telephone Encounter (Signed)
rx sent in electronically 

## 2012-04-07 NOTE — Telephone Encounter (Signed)
Refill- omeprazole 20mg  cap. Take one capsule by mouth twice daily. Qty 180 last fill 5.31.13

## 2012-04-20 ENCOUNTER — Telehealth: Payer: Self-pay | Admitting: Internal Medicine

## 2012-04-20 ENCOUNTER — Encounter: Payer: Self-pay | Admitting: Internal Medicine

## 2012-04-20 ENCOUNTER — Ambulatory Visit (INDEPENDENT_AMBULATORY_CARE_PROVIDER_SITE_OTHER): Payer: 59 | Admitting: Internal Medicine

## 2012-04-20 VITALS — BP 110/70 | Temp 98.3°F | Wt 150.0 lb

## 2012-04-20 DIAGNOSIS — K589 Irritable bowel syndrome without diarrhea: Secondary | ICD-10-CM

## 2012-04-20 DIAGNOSIS — K219 Gastro-esophageal reflux disease without esophagitis: Secondary | ICD-10-CM

## 2012-04-20 DIAGNOSIS — I10 Essential (primary) hypertension: Secondary | ICD-10-CM

## 2012-04-20 MED ORDER — HYOSCYAMINE SULFATE CR 0.375 MG PO CP12: 0.3750 mg | ORAL_CAPSULE | Freq: Two times a day (BID) | ORAL | Status: DC | PRN

## 2012-04-20 NOTE — Telephone Encounter (Signed)
Patient [name], PCP Dr Artist Pais, calling in regards to Abdominal pain . Onset "several days"  04/16/12.  She is post Knee surgery three weeks ago along with history of Hiatel hernia, eophageal ulcer and stomach spasm.  Patient complaining of upper and lower abdominal pain "cramping" . Treating with Levsin x3 (reached max dose).  New medication of ASA and Tylenol/Tramadol.  Described as mild with Levsin dulling pain.  Emergent s/sx ruled out per Abdominal Pain Protocol with exception of "Abdominal discomfort and has started new prescription , non prescription or alternative medication ". See Provider in 24 hours. Appt scheduled today 04/20/12 at 10:00 with Dr. Beverely Low at [facility]. Patient expressed understading.
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
## 2012-04-20 NOTE — Patient Instructions (Signed)
High fiber diet Consider fiber supplements such as Metamucil Call or return to clinic prn if these symptoms worsen or fail to improve as anticipated. Diet and Irritable Bowel Syndrome   No cure has been found for irritable bowel syndrome (IBS). Many options are available to treat the symptoms. Your caregiver will give you the best treatments available for your symptoms. He or she will also encourage you to manage stress and to make changes to your diet. You need to work with your caregiver and Registered Dietician to find the best combination of medicine, diet, counseling, and support to control your symptoms. The following are some diet suggestions. FOODS THAT MAKE IBS WORSE  Fatty foods, such as [place] fries.   Milk products, such as cheese or ice cream.   Chocolate.   Alcohol.   Caffeine (found in coffee and some sodas).   Carbonated drinks, such as soda.  If certain foods cause symptoms, you should eat less of them or stop eating them. FOOD JOURNAL    Keep a journal of the foods that seem to cause distress. Write down:   What you are eating during the day and when.   What problems you are having after eating.   When the symptoms occur in relation to your meals.   What foods always make you feel badly.   Take your notes with you to your caregiver to see if you should stop eating certain foods.  FOODS THAT MAKE IBS BETTER Fiber reduces IBS symptoms, especially constipation, because it makes stools soft, bulky, and easier to pass. Fiber is found in bran, bread, cereal, beans, fruit, and vegetables. Examples of foods with fiber include:  Apples.   Peaches.   Pears.   Berries.   Figs.   Broccoli, raw.   Cabbage.   Carrots.   Raw peas.   Kidney beans.   Lima beans.   Whole-grain bread.   Whole-grain cereal.  Add foods with fiber to your diet a little at a time. This will let your body get used to them. Too much fiber at once might cause gas and swelling of  your abdomen. This can trigger symptoms in a person with IBS. Caregivers usually recommend a diet with enough fiber to produce soft, painless bowel movements. High fiber diets may cause gas and bloating. However, these symptoms often go away within a few weeks, as your body adjusts. In many cases, dietary fiber may lessen IBS symptoms, particularly constipation. However, it may not help pain or diarrhea. High fiber diets keep the colon mildly enlarged (distended) with the added fiber. This may help prevent spasms in the colon. Some forms of fiber also keep water in the stool, thereby preventing hard stools that are difficult to pass.   Besides telling you to eat more foods with fiber, your caregiver may also tell you to get more fiber by taking a fiber pill or drinking water mixed with a special high fiber powder. An example of this is a natural fiber laxative containing psyllium seed.   TIPS  Large meals can cause cramping and diarrhea in people with IBS. If this happens to you, try eating 4 or 5 small meals a day, or try eating less at each of your usual 3 meals. It may also help if your meals are low in fat and high in carbohydrates. Examples of carbohydrates are pasta, rice, whole-grain breads and cereals, fruits, and vegetables.   If dairy products cause your symptoms to flare up, you can try  eating less of those foods. You might be able to handle yogurt better than other dairy products, because it contains bacteria that helps with digestion. Dairy products are an important source of calcium and other nutrients. If you need to avoid dairy products, be sure to talk with a Registered Dietitian about getting these nutrients through other food sources.   Drink enough water and fluids to keep your urine clear or pale yellow. This is important, especially if you have diarrhea.  FOR MORE INFORMATION   International Foundation for Functional Gastrointestinal Disorders: www.iffgd.org   National Digestive  Diseases Information Clearinghouse: digestive.StageSync.si Document Released: 12/18/2003 Document Revised: 09/16/2011 Document Reviewed: 09/04/2007 [facility] Patient Information 2012 [place], [place].

## 2012-05-03 ENCOUNTER — Telehealth: Payer: Self-pay | Admitting: Internal Medicine

## 2012-05-03 ENCOUNTER — Encounter: Payer: Self-pay | Admitting: Internal Medicine

## 2012-05-03 ENCOUNTER — Ambulatory Visit (INDEPENDENT_AMBULATORY_CARE_PROVIDER_SITE_OTHER): Payer: 59 | Admitting: Internal Medicine

## 2012-05-03 VITALS — BP 130/90 | Temp 98.5°F | Ht 60.0 in | Wt 150.0 lb

## 2012-05-03 DIAGNOSIS — H6990 Unspecified Eustachian tube disorder, unspecified ear: Secondary | ICD-10-CM | POA: Insufficient documentation

## 2012-05-03 DIAGNOSIS — H6980 Other specified disorders of Eustachian tube, unspecified ear: Secondary | ICD-10-CM

## 2012-05-03 MED ORDER — MOMETASONE FUROATE 50 MCG/ACT NA SUSP: NASAL | Status: DC

## 2012-05-03 NOTE — Assessment & Plan Note (Signed)
I suspect her left ear symptoms are secondary to eustachian tube dysfunction. Trial of Nasonex 2 sprays each nostril once daily for 4-6 weeks.  Patient advised to call office if symptoms persist or worsen.

## 2012-05-03 NOTE — Patient Instructions (Addendum)
Use nasonex as directed. Please call our office if your symptoms do not improve or gets worse.
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
## 2012-05-03 NOTE — Telephone Encounter (Signed)
Caller: Rasha/Patient; PCP: Allena Earing); CB#: [phone];  Call regarding Drumming in L Ear; Onset: 04/26/12. Afebrile.   Reports intermittent sound like "fast heart beat" or "like something fluttering" in ear; pulse NL, approx 80.  Advised to see MD within 24 hrs for intermittent throbbing in ear may interfere with sleep or ability to carry out normal activities per Ear Symptoms Guideline. Appt scheduled for 1400 05/03/12 with Dr Artist Pais.

## 2012-05-12 ENCOUNTER — Telehealth: Payer: Self-pay | Admitting: Family Medicine

## 2012-05-12 NOTE — Telephone Encounter (Signed)
Dr. Darron Doom called from [facility] - this is an insurance/disability company (or determining company). He had [employer] in his office today to do a visibility evaluation, and would like a call from you regarding this. Thanks.

## 2012-05-12 NOTE — Telephone Encounter (Signed)
Called Dr. Darron Doom.   Inc [employer] is disputing 2 days of disability before her surgery.  Pt was seen before her surgery.  My notes clearly describe pt was experiencing significant knee pain and was unable to work.

## 2012-05-12 NOTE — Telephone Encounter (Signed)
What is his contact information?

## 2012-05-17 ENCOUNTER — Other Ambulatory Visit: Payer: Self-pay | Admitting: Internal Medicine

## 2012-06-13 ENCOUNTER — Telehealth: Payer: Self-pay | Admitting: Internal Medicine

## 2012-06-13 MED ORDER — HYDROCHLOROTHIAZIDE 12.5 MG PO CAPS: 12.5000 mg | ORAL_CAPSULE | Freq: Every day | ORAL | Status: DC

## 2012-06-13 NOTE — Telephone Encounter (Signed)
Rx sent to pharmacy   

## 2012-06-13 NOTE — Telephone Encounter (Signed)
Refill- microzide 12.5mg  cap. Take one capsule by mouth one time daily. Qty 30 last fill 7.31.13

## 2012-09-01 ENCOUNTER — Other Ambulatory Visit: Payer: Self-pay | Admitting: *Deleted

## 2012-09-01 MED ORDER — HYOSCYAMINE SULFATE 0.125 MG PO TABS: 0.1250 mg | ORAL_TABLET | Freq: Three times a day (TID) | ORAL | Status: DC

## 2012-09-13 ENCOUNTER — Other Ambulatory Visit: Payer: Self-pay | Admitting: *Deleted

## 2012-09-13 MED ORDER — HYDROCHLOROTHIAZIDE 12.5 MG PO CAPS: 12.5000 mg | ORAL_CAPSULE | Freq: Every day | ORAL | Status: DC

## 2012-09-13 MED ORDER — OMEPRAZOLE 20 MG PO CPDR: 20.0000 mg | DELAYED_RELEASE_CAPSULE | Freq: Two times a day (BID) | ORAL | Status: DC

## 2012-10-25 ENCOUNTER — Telehealth: Payer: Self-pay | Admitting: Internal Medicine

## 2012-10-25 DIAGNOSIS — M949 Disorder of cartilage, unspecified: Secondary | ICD-10-CM

## 2012-10-25 DIAGNOSIS — M899 Disorder of bone, unspecified: Secondary | ICD-10-CM

## 2012-10-25 NOTE — Telephone Encounter (Signed)
Left message for pt to call back, I need to know what location her mammogram is at

## 2012-10-25 NOTE — Telephone Encounter (Signed)
Pt is scheduled for mammogram on 02/01/13 and would like to do a bone scan at the same time.  (Since they do them also.) Can you please submit referral for this?

## 2012-10-25 NOTE — Telephone Encounter (Signed)
Referral order placed.

## 2012-10-26 ENCOUNTER — Encounter: Payer: Self-pay | Admitting: Gastroenterology

## 2012-11-08 ENCOUNTER — Encounter: Payer: Self-pay | Admitting: Gastroenterology

## 2012-11-08 ENCOUNTER — Ambulatory Visit (INDEPENDENT_AMBULATORY_CARE_PROVIDER_SITE_OTHER): Payer: 59 | Admitting: Gastroenterology

## 2012-11-08 VITALS — BP 138/76 | HR 82 | Ht 60.0 in | Wt 155.0 lb

## 2012-11-08 DIAGNOSIS — K589 Irritable bowel syndrome without diarrhea: Secondary | ICD-10-CM

## 2012-11-08 DIAGNOSIS — M899 Disorder of bone, unspecified: Secondary | ICD-10-CM

## 2012-11-08 DIAGNOSIS — K219 Gastro-esophageal reflux disease without esophagitis: Secondary | ICD-10-CM

## 2012-11-08 DIAGNOSIS — Z86010 Personal history of colonic polyps: Secondary | ICD-10-CM

## 2012-11-08 DIAGNOSIS — M8580 Other specified disorders of bone density and structure, unspecified site: Secondary | ICD-10-CM

## 2012-11-08 MED ORDER — PEG-KCL-NACL-NASULF-NA ASC-C 100 G PO SOLR: 1.0000 | Freq: Once | ORAL | Status: DC

## 2012-11-08 MED ORDER — RANITIDINE HCL 300 MG PO TABS: 300.0000 mg | ORAL_TABLET | Freq: Two times a day (BID) | ORAL | Status: DC

## 2012-11-08 NOTE — Patient Instructions (Addendum)
You have been scheduled for a colonoscopy with propofol. Please follow written instructions given to you at your visit today.  Please pick up your prep kit at the pharmacy within the next 1-3 days. If you use inhalers (even only as needed) or a CPAP machine, please bring them with you on the day of your procedure.  We have sent the following medications to your pharmacy for you to pick up at your convenience:Ranitidine.  Patient advised to avoid spicy, acidic, citrus, chocolate, mints, fruit and fruit juices.  Limit the intake of caffeine, alcohol and Soda.  Don't exercise too soon after eating.  Don't lie down within 3-4 hours of eating.  Elevate the head of your bed.  Thank you for choosing me and [facility].  Venita Lick. Pleas Koch., MD., Clementeen Graham

## 2012-11-08 NOTE — Progress Notes (Signed)
History of Present Illness: This is a 67 year old female with a history of GERD with erosive esophagitis and peptic stricture. She's been maintained on PPI medications for about 20 years. Omeprazole 20 mg twice daily has been effective in controlling her symptoms although she occasionally has breakthrough symptoms. She's attempted to reduce the dose to once daily on several occasions but her symptoms worsened. She is concerned about her osteopenia. She states she cannot tolerate any of the osteopenia medications that have been tried. She has chronic problems with intermittent abdominal pain sometimes in the left upper quadrant sometimes in the lower abdomen and the symptoms have generally responded to Levsin and/or Levbid. She is due for colon polyp followup. She had upper endoscopy performed in 1998, 2004 and 2009. She notes occasional mild constipation. Denies weight loss, diarrhea, change in stool caliber, melena, hematochezia, nausea, vomiting, dysphagia, chest pain.  Review of Systems: Pertinent positive and negative review of systems were noted in the above HPI section. All other review of systems were otherwise negative.  Current Medications, Allergies, Past Medical History, Past Surgical History, Family History and Social History were reviewed in [employer] record.  Physical Exam: General: Well developed , well nourished, no acute distress Head: Normocephalic and atraumatic Eyes:  sclerae anicteric, EOMI Ears: Normal auditory acuity Mouth: No deformity or lesions Neck: Supple, no masses or thyromegaly Lungs: Clear throughout to auscultation Heart: Regular rate and rhythm; no murmurs, rubs or bruits Abdomen: Soft, non tender and non distended. No masses, hepatosplenomegaly or hernias noted. Normal Bowel sounds Rectal: Deferred to colonoscopy Musculoskeletal: Symmetrical with no gross deformities  Skin: No lesions on visible extremities Pulses:  Normal pulses  noted Extremities: No clubbing, cyanosis, edema or deformities noted Neurological: Alert oriented x 4, grossly nonfocal Cervical Nodes:  No significant cervical adenopathy Inguinal Nodes: No significant inguinal adenopathy Psychological:  Alert and cooperative. Normal mood and affect  Assessment and Recommendations:  1. Personal history of adenomatous colon polyps. She is due for a five-year surveillance colonoscopy. The risks, benefits, and alternatives to colonoscopy with possible biopsy and possible polypectomy were discussed with the patient and they consent to proceed.   2. GERD. History of esophageal stricture and history of esophagitis. Omeprazole 20 mg twice a day has been effective in controlling her symptoms and her last 2 endoscopies have not shown evidence of esophagitis. She would like to try to discontinue PPI medications because of the potential for worsening osteopenia. We had a long discussion about this and I feel it is unlikely that her GERD will be controlled without a PPI twice daily. Trial of ranitidine 300 mg twice a day, if this is not effective consider omeprazole 20 mg every morning and ranitidine 300 mg every afternoon.  3. IBS. Mild constipation. Increase dietary fiber and water. Stool softener daily as needed. Levbid twice a day when necessary and Levsin 1-2 every 4 hours when necessary.

## 2012-11-09 ENCOUNTER — Encounter: Payer: Self-pay | Admitting: Gastroenterology

## 2012-11-28 ENCOUNTER — Encounter: Payer: Self-pay | Admitting: Gastroenterology

## 2012-11-28 ENCOUNTER — Ambulatory Visit (AMBULATORY_SURGERY_CENTER): Payer: 59 | Admitting: Gastroenterology

## 2012-11-28 VITALS — BP 135/90 | HR 83 | Temp 97.1°F | Resp 19 | Ht 60.0 in | Wt 155.0 lb

## 2012-11-28 DIAGNOSIS — Z86010 Personal history of colonic polyps: Secondary | ICD-10-CM

## 2012-11-28 DIAGNOSIS — Z1211 Encounter for screening for malignant neoplasm of colon: Secondary | ICD-10-CM

## 2012-11-28 MED ORDER — SODIUM CHLORIDE 0.9 % IV SOLN: 500.0000 mL | INTRAVENOUS | Status: DC

## 2012-11-28 NOTE — Progress Notes (Signed)
Lidocaine-40mg IV prior to Propofol InductionPropofol given over incremental dosages 

## 2012-11-28 NOTE — Op Note (Signed)
[facility] [address] [place], [address]   COLONOSCOPY PROCEDURE REPORT  PATIENT: [name], [name]  MR#: [id number] BIRTHDATE: 29-Sep-1946 , 66  yrs. old GENDER: Female ENDOSCOPIST: Meryl Dare, MD, [facility] PROCEDURE DATE:  11/28/2012 PROCEDURE:   Colonoscopy, screening ASA CLASS:   Class II INDICATIONS:Patient's personal history of adenomatous colon polyps.  MEDICATIONS: MAC sedation, administered by CRNA and propofol (Diprivan) 150mg  IV DESCRIPTION OF PROCEDURE:   After the risks benefits and alternatives of the procedure were thoroughly explained, informed consent was obtained.  A digital rectal exam revealed no abnormalities of the rectum and A digital rectal exam revealed external hemorrhoids.   The LB [id number]  endoscope was introduced through the anus and advanced to the cecum, which was identified by both the appendix and ileocecal valve. No adverse events experienced.   The quality of the prep was excellent, using MoviPrep  The instrument was then slowly withdrawn as the colon was fully examined.  COLON FINDINGS: A normal appearing cecum, ileocecal valve, and appendiceal orifice were identified.  The ascending, hepatic flexure, transverse, splenic flexure, descending, sigmoid colon and rectum appeared unremarkable.  No polyps or cancers were seen. Retroflexed views revealed internal hemorrhoids. The time to cecum=3 minutes 51 seconds.  Withdrawal time=10 minutes 55 seconds. The scope was withdrawn and the procedure completed.  COMPLICATIONS: There were no complications.  ENDOSCOPIC IMPRESSION: 1.  Normal colon 2.  Internal and external hemorrhoids  RECOMMENDATIONS: 1.  Repeat Colonoscopy in 5 years.   eSigned:  Meryl Dare, MD, [facility] 11/28/2012 3:18 PM

## 2012-11-28 NOTE — Progress Notes (Signed)
No complaints noted in the recovery room. Maw  Patient did not experience any of the following events: a burn prior to discharge; a fall within the facility; wrong site/side/patient/procedure/implant event; or a hospital transfer or hospital admission upon discharge from the facility. (G8907) Patient did not have preoperative order for IV antibiotic SSI prophylaxis. (G8918)  

## 2012-11-28 NOTE — Patient Instructions (Addendum)
Handouts were given to your care partner on hemorrhoids and a high fiber diet with liberal fluid intake.  You may resume your current medications today.  Please call if any questions or concerns.     YOU HAD AN ENDOSCOPIC PROCEDURE TODAY AT THE [facility]: Refer to the procedure report that was given to you for any specific questions about what was found during the examination.  If the procedure report does not answer your questions, please call your gastroenterologist to clarify.  If you requested that your care partner not be given the details of your procedure findings, then the procedure report has been included in a sealed envelope for you to review at your convenience later.  YOU SHOULD EXPECT: Some feelings of bloating in the abdomen. Passage of more gas than usual.  Walking can help get rid of the air that was put into your GI tract during the procedure and reduce the bloating. If you had a lower endoscopy (such as a colonoscopy or flexible sigmoidoscopy) you may notice spotting of blood in your stool or on the toilet paper. If you underwent a bowel prep for your procedure, then you may not have a normal bowel movement for a few days.  DIET: Your first meal following the procedure should be a light meal and then it is ok to progress to your normal diet.  A half-sandwich or bowl of soup is an example of a good first meal.  Heavy or fried foods are harder to digest and may make you feel nauseous or bloated.  Likewise meals heavy in dairy and vegetables can cause extra gas to form and this can also increase the bloating.  Drink plenty of fluids but you should avoid alcoholic beverages for 24 hours.  ACTIVITY: Your care partner should take you home directly after the procedure.  You should plan to take it easy, moving slowly for the rest of the day.  You can resume normal activity the day after the procedure however you should NOT DRIVE or use heavy machinery for 24 hours (because of the  sedation medicines used during the test).    SYMPTOMS TO REPORT IMMEDIATELY: A gastroenterologist can be reached at any hour.  During normal business hours, 8:30 AM to 5:00 PM Monday through Friday, call [phone].  After hours and on weekends, please call the GI answering service at [phone] who will take a message and have the physician on call contact you.   Following lower endoscopy (colonoscopy or flexible sigmoidoscopy):  Excessive amounts of blood in the stool  Significant tenderness or worsening of abdominal pains  Swelling of the abdomen that is new, acute  Fever of 100F or higher    FOLLOW UP: If any biopsies were taken you will be contacted by phone or by letter within the next 1-3 weeks.  Call your gastroenterologist if you have not heard about the biopsies in 3 weeks.  Our staff will call the home number listed on your records the next business day following your procedure to check on you and address any questions or concerns that you may have at that time regarding the information given to you following your procedure. This is a courtesy call and so if there is no answer at the home number and we have not heard from you through the emergency physician on call, we will assume that you have returned to your regular daily activities without incident.  SIGNATURES/CONFIDENTIALITY: You and/or your care partner have signed paperwork  which will be entered into your electronic medical record.  These signatures attest to the fact that that the information above on your After Visit Summary has been reviewed and is understood.  Full responsibility of the confidentiality of this discharge information lies with you and/or your care-partner.

## 2012-11-29 ENCOUNTER — Telehealth: Payer: Self-pay | Admitting: *Deleted

## 2012-11-29 NOTE — Telephone Encounter (Signed)
  Follow up Call-  Call back number 11/28/2012  Post procedure Call Back phone  # [phone]  Permission to leave phone message Yes     Patient questions:  Do you have a fever, pain , or abdominal swelling? no Pain Score  0 *  Have you tolerated food without any problems? yes  Have you been able to return to your normal activities? yes  Do you have any questions about your discharge instructions: Diet   no Medications  no Follow up visit  no  Do you have questions or concerns about your Care? no  Actions: * If pain score is 4 or above: No action needed, pain <4.

## 2012-11-30 ENCOUNTER — Telehealth: Payer: Self-pay | Admitting: Internal Medicine

## 2012-11-30 NOTE — Telephone Encounter (Signed)
Patient called stating that due to her work schedule and her home being 45 mins away she would like to have her cpx labs done the morning of her physical 12/07/12 as she has appts in [place] the whole day. This will keep the patient from missing more work than needed and save her another 45 minute drive. Please advise.

## 2012-11-30 NOTE — Telephone Encounter (Signed)
That's fine but she will need to be fasting

## 2012-11-30 NOTE — Telephone Encounter (Signed)
appt changed

## 2012-12-07 ENCOUNTER — Ambulatory Visit (INDEPENDENT_AMBULATORY_CARE_PROVIDER_SITE_OTHER): Payer: 59 | Admitting: Internal Medicine

## 2012-12-07 ENCOUNTER — Other Ambulatory Visit (INDEPENDENT_AMBULATORY_CARE_PROVIDER_SITE_OTHER): Payer: 59

## 2012-12-07 ENCOUNTER — Encounter: Payer: Self-pay | Admitting: Internal Medicine

## 2012-12-07 VITALS — BP 112/70 | Temp 98.6°F | Ht 60.5 in | Wt 150.0 lb

## 2012-12-07 DIAGNOSIS — Z0000 Encounter for general adult medical examination without abnormal findings: Secondary | ICD-10-CM

## 2012-12-07 DIAGNOSIS — E739 Lactose intolerance, unspecified: Secondary | ICD-10-CM

## 2012-12-07 DIAGNOSIS — E785 Hyperlipidemia, unspecified: Secondary | ICD-10-CM

## 2012-12-07 DIAGNOSIS — I10 Essential (primary) hypertension: Secondary | ICD-10-CM

## 2012-12-07 LAB — POCT URINALYSIS DIPSTICK
Bilirubin, UA: NEGATIVE
Blood, UA: NEGATIVE
Glucose, UA: NEGATIVE
Ketones, UA: NEGATIVE
Leukocytes, UA: NEGATIVE
Nitrite, UA: NEGATIVE
Protein, UA: NEGATIVE
Spec Grav, UA: 1.01
Urobilinogen, UA: 0.2
pH, UA: 7

## 2012-12-07 LAB — LDL CHOLESTEROL, DIRECT: Direct LDL: 155.9 mg/dL

## 2012-12-07 LAB — CBC WITH DIFFERENTIAL/PLATELET
Basophils Absolute: 0 10*3/uL (ref 0.0–0.1)
Basophils Relative: 0.4 % (ref 0.0–3.0)
Eosinophils Absolute: 0.1 10*3/uL (ref 0.0–0.7)
Eosinophils Relative: 1.3 % (ref 0.0–5.0)
HCT: 43.2 % (ref 36.0–46.0)
Hemoglobin: 14.6 g/dL (ref 12.0–15.0)
Lymphocytes Relative: 41.2 % (ref 12.0–46.0)
Lymphs Abs: 2.4 10*3/uL (ref 0.7–4.0)
MCHC: 33.8 g/dL (ref 30.0–36.0)
MCV: 86.7 fl (ref 78.0–100.0)
Monocytes Absolute: 0.5 10*3/uL (ref 0.1–1.0)
Monocytes Relative: 8.7 % (ref 3.0–12.0)
Neutro Abs: 2.8 10*3/uL (ref 1.4–7.7)
Neutrophils Relative %: 48.4 % (ref 43.0–77.0)
Platelets: 216 10*3/uL (ref 150.0–400.0)
RBC: 4.98 Mil/uL (ref 3.87–5.11)
RDW: 13 % (ref 11.5–14.6)
WBC: 5.8 10*3/uL (ref 4.5–10.5)

## 2012-12-07 LAB — TSH: TSH: 2.69 u[IU]/mL (ref 0.35–5.50)

## 2012-12-07 LAB — LIPID PANEL
Cholesterol: 229 mg/dL — ABNORMAL HIGH (ref 0–200)
HDL: 54.7 mg/dL (ref >39.00)
Total CHOL/HDL Ratio: 4
Triglycerides: 109 mg/dL (ref 0.0–149.0)
VLDL: 21.8 mg/dL (ref 0.0–40.0)

## 2012-12-07 LAB — HEPATIC FUNCTION PANEL
ALT: 15 U/L (ref 0–35)
AST: 19 U/L (ref 0–37)
Albumin: 4 g/dL (ref 3.5–5.2)
Alkaline Phosphatase: 49 U/L (ref 39–117)
Bilirubin, Direct: 0.1 mg/dL (ref 0.0–0.3)
Total Bilirubin: 1 mg/dL (ref 0.3–1.2)
Total Protein: 6.7 g/dL (ref 6.0–8.3)

## 2012-12-07 LAB — BASIC METABOLIC PANEL
BUN: 12 mg/dL (ref 6–23)
CO2: 28 mEq/L (ref 19–32)
Calcium: 9.4 mg/dL (ref 8.4–10.5)
Chloride: 101 mEq/L (ref 96–112)
Creatinine, Ser: 0.9 mg/dL (ref 0.4–1.2)
GFR: 71.2 mL/min (ref >60.00)
Glucose, Bld: 83 mg/dL (ref 70–99)
Potassium: 4 mEq/L (ref 3.5–5.1)
Sodium: 138 mEq/L (ref 135–145)

## 2012-12-07 LAB — HEMOGLOBIN A1C: Hgb A1c MFr Bld: 5.8 % (ref 4.6–6.5)

## 2012-12-07 MED ORDER — HYDROCHLOROTHIAZIDE 12.5 MG PO TABS: 12.5000 mg | ORAL_TABLET | Freq: Every day | ORAL | Status: DC

## 2012-12-07 NOTE — Assessment & Plan Note (Signed)
Reviewed adult health maintenance protocols.  He deferred Pap smear until 2015. She is up-to-date with mammogram and colonoscopy.  She also had DEXA scan. We are awaiting results. Healthy diet and regular exercise encouraged. Monitor A1c.

## 2012-12-07 NOTE — Assessment & Plan Note (Signed)
Blood pressure is low normal. Continue to monitor blood pressure at home. If systolic blood pressure less than 110, patient advised to decrease her hydrochlorothiazide dose in half.
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
## 2013-01-02 ENCOUNTER — Encounter: Payer: Self-pay | Admitting: Internal Medicine

## 2013-01-03 ENCOUNTER — Encounter: Payer: Self-pay | Admitting: Internal Medicine

## 2013-01-19 ENCOUNTER — Encounter: Payer: Self-pay | Admitting: Gastroenterology

## 2013-01-25 ENCOUNTER — Other Ambulatory Visit: Payer: 59

## 2013-02-02 ENCOUNTER — Encounter: Payer: 59 | Admitting: Internal Medicine

## 2013-03-07 ENCOUNTER — Other Ambulatory Visit: Payer: Self-pay | Admitting: Internal Medicine

## 2013-04-17 ENCOUNTER — Telehealth: Payer: Self-pay | Admitting: Internal Medicine

## 2013-04-17 NOTE — Telephone Encounter (Signed)
Patient Information:  Caller Name: Livy  Phone: [phone]  Patient: [name], [name]  Gender: Female  DOB: 01/29/1946  Age: 67 Years  PCP: Artist Pais Doe-Hyun Molly Maduro) (Adults only)  Office Follow Up:  Does the office need to follow up with this patient?: No  Instructions For The Office: N/A  RN Note:  Pt has had intermittent heart fluttering after caffiene for 1 month, symptoms have been worsen per Pt.  Pt denies SOB, CP or ir-regular heart beat at time of call.  Pt is asymptomatic after taking Ativan 0.125mg , would like to see Dr Artist Pais on 7-11.  Advised Pt to call back if symptoms returned.  Transferred to office for appt needs.  Symptoms  Reason For Call & Symptoms: Intermittent Ir-regular Heart Rate  Reviewed Health History In EMR: Yes  Reviewed Medications In EMR: Yes  Reviewed Allergies In EMR: Yes  Reviewed Surgeries / Procedures: Yes  Date of Onset of Symptoms: 03/18/2013  Treatments Tried: Ativan 0.125mg  taken on 7-8  Treatments Tried Worked: Yes  Guideline(s) Used:  No Protocol Available - Sick Adult  Disposition Per Guideline:   See Today or Tomorrow in Office  Reason For Disposition Reached:   Nursing judgment  Advice Given:  N/A  Patient Will Follow Care Advice:  YES

## 2013-04-20 ENCOUNTER — Ambulatory Visit (INDEPENDENT_AMBULATORY_CARE_PROVIDER_SITE_OTHER): Payer: 59 | Admitting: Family

## 2013-04-20 ENCOUNTER — Encounter: Payer: Self-pay | Admitting: Family

## 2013-04-20 VITALS — BP 140/86 | HR 93 | Wt 153.0 lb

## 2013-04-20 DIAGNOSIS — R002 Palpitations: Secondary | ICD-10-CM

## 2013-04-20 DIAGNOSIS — R079 Chest pain, unspecified: Secondary | ICD-10-CM

## 2013-04-20 LAB — TSH: TSH: 3.05 u[IU]/mL (ref 0.35–5.50)

## 2013-04-20 LAB — COMPREHENSIVE METABOLIC PANEL
ALT: 21 U/L (ref 0–35)
AST: 18 U/L (ref 0–37)
Albumin: 4.2 g/dL (ref 3.5–5.2)
Alkaline Phosphatase: 43 U/L (ref 39–117)
BUN: 17 mg/dL (ref 6–23)
CO2: 27 mEq/L (ref 19–32)
Calcium: 9.6 mg/dL (ref 8.4–10.5)
Chloride: 108 mEq/L (ref 96–112)
Creatinine, Ser: 0.8 mg/dL (ref 0.4–1.2)
GFR: 73.1 mL/min (ref >60.00)
Glucose, Bld: 84 mg/dL (ref 70–99)
Potassium: 4.4 mEq/L (ref 3.5–5.1)
Sodium: 140 mEq/L (ref 135–145)
Total Bilirubin: 0.6 mg/dL (ref 0.3–1.2)
Total Protein: 7.1 g/dL (ref 6.0–8.3)

## 2013-04-20 LAB — CBC WITH DIFFERENTIAL/PLATELET
Basophils Absolute: 0 10*3/uL (ref 0.0–0.1)
Basophils Relative: 0.5 % (ref 0.0–3.0)
Eosinophils Absolute: 0.1 10*3/uL (ref 0.0–0.7)
Eosinophils Relative: 1.3 % (ref 0.0–5.0)
HCT: 43.1 % (ref 36.0–46.0)
Hemoglobin: 14.6 g/dL (ref 12.0–15.0)
Lymphocytes Relative: 39.4 % (ref 12.0–46.0)
Lymphs Abs: 2.4 10*3/uL (ref 0.7–4.0)
MCHC: 34 g/dL (ref 30.0–36.0)
MCV: 89.2 fl (ref 78.0–100.0)
Monocytes Absolute: 0.5 10*3/uL (ref 0.1–1.0)
Monocytes Relative: 8.2 % (ref 3.0–12.0)
Neutro Abs: 3.1 10*3/uL (ref 1.4–7.7)
Neutrophils Relative %: 50.6 % (ref 43.0–77.0)
Platelets: 205 10*3/uL (ref 150.0–400.0)
RBC: 4.84 Mil/uL (ref 3.87–5.11)
RDW: 12.8 % (ref 11.5–14.6)
WBC: 6.2 10*3/uL (ref 4.5–10.5)

## 2013-04-20 NOTE — Patient Instructions (Addendum)
Palpitations  A palpitation is the feeling that your heartbeat is irregular or is faster than normal. It may feel like your heart is fluttering or skipping a beat. Palpitations are usually not a serious problem. However, in some cases, you may need further medical evaluation. CAUSES  Palpitations can be caused by:  Smoking.  Caffeine or other stimulants, such as diet pills or energy drinks.  Alcohol.  Stress and anxiety.  Strenuous physical activity.  Fatigue.  Certain medicines.  Heart disease, especially if you have a history of arrhythmias. This includes atrial fibrillation, atrial flutter, or supraventricular tachycardia.  An improperly working pacemaker or defibrillator. DIAGNOSIS  To find the cause of your palpitations, your caregiver will take your history and perform a physical exam. Tests may also be done, including:  Electrocardiography (ECG). This test records the heart's electrical activity.  Cardiac monitoring. This allows your caregiver to monitor your heart rate and rhythm in real time.  Holter monitor. This is a portable device that records your heartbeat and can help diagnose heart arrhythmias. It allows your caregiver to track your heart activity for several days, if needed.  Stress tests by exercise or by giving medicine that makes the heart beat faster. TREATMENT  Treatment of palpitations depends on the cause of your symptoms and can vary greatly. Most cases of palpitations do not require any treatment other than time, relaxation, and monitoring your symptoms. Other causes, such as atrial fibrillation, atrial flutter, or supraventricular tachycardia, usually require further treatment. HOME CARE INSTRUCTIONS   Avoid:  Caffeinated coffee, tea, soft drinks, diet pills, and energy drinks.  Chocolate.  Alcohol.  Stop smoking if you smoke.  Reduce your stress and anxiety. Things that can help you relax include:  A method that measures bodily functions so  you can learn to control them (biofeedback).  Yoga.  Meditation.  Physical activity such as swimming, jogging, or walking.  Get plenty of rest and sleep. SEEK MEDICAL CARE IF:   You continue to have a fast or irregular heartbeat beyond 24 hours.  Your palpitations occur more often. SEEK IMMEDIATE MEDICAL CARE IF:  You develop chest pain or shortness of breath.  You have a severe headache.  You feel dizzy, or you faint. MAKE SURE YOU:  Understand these instructions.  Will watch your condition.  Will get help right away if you are not doing well or get worse. Document Released: 09/24/2000 Document Revised: 03/28/2012 Document Reviewed: 11/26/2011 Exit[employer] Patient Information 2014 [employer].  
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
## 2013-06-27 ENCOUNTER — Other Ambulatory Visit: Payer: Self-pay | Admitting: Internal Medicine

## 2013-07-01 ENCOUNTER — Other Ambulatory Visit: Payer: Self-pay | Admitting: Internal Medicine

## 2013-07-17 DIAGNOSIS — D2210 Melanocytic nevi of unspecified eyelid, including canthus: Secondary | ICD-10-CM | POA: Insufficient documentation

## 2013-07-17 DIAGNOSIS — H0012 Chalazion right lower eyelid: Secondary | ICD-10-CM | POA: Insufficient documentation

## 2013-07-18 ENCOUNTER — Ambulatory Visit (INDEPENDENT_AMBULATORY_CARE_PROVIDER_SITE_OTHER): Payer: 59 | Admitting: Cardiology

## 2013-07-18 ENCOUNTER — Encounter: Payer: Self-pay | Admitting: Cardiology

## 2013-07-18 VITALS — BP 142/81 | HR 91 | Ht 60.0 in | Wt 155.0 lb

## 2013-07-18 DIAGNOSIS — R002 Palpitations: Secondary | ICD-10-CM

## 2013-07-18 DIAGNOSIS — R079 Chest pain, unspecified: Secondary | ICD-10-CM

## 2013-07-18 MED ORDER — METOPROLOL TARTRATE 25 MG PO TABS: 12.5000 mg | ORAL_TABLET | ORAL | Status: DC | PRN
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
## 2013-07-18 NOTE — Patient Instructions (Signed)
Please take Metoprolol tartrate 25 mg 1/2 tablet as needed for palpitations. Continue all other medications as listed.  Your physician has requested that you have an exercise tolerance test. For further information please visit [email or web address] Please also follow instruction sheet, as given.

## 2013-08-14 ENCOUNTER — Ambulatory Visit (INDEPENDENT_AMBULATORY_CARE_PROVIDER_SITE_OTHER): Payer: 59 | Admitting: Physician Assistant

## 2013-08-14 DIAGNOSIS — R079 Chest pain, unspecified: Secondary | ICD-10-CM

## 2013-08-14 DIAGNOSIS — R002 Palpitations: Secondary | ICD-10-CM

## 2013-08-14 NOTE — Progress Notes (Signed)
Exercise Treadmill Test  Pre-Exercise Testing Evaluation Rhythm: normal sinus  Rate: 76     Test  Exercise Tolerance Test Ordering MD: Angelina Sheriff, MD  Interpreting MD: Tereso Newcomer, PA-C  Unique Test No: 1  Treadmill:  1  Indication for ETT: chest pain - rule out ischemia  Contraindication to ETT: No   Stress Modality: exercise - treadmill  Cardiac Imaging Performed: non   Protocol: standard Bruce - maximal  Max BP:  186/102  Max MPHR (bpm):  154 85% MPR (bpm):  131  MPHR obtained (bpm):  153 % MPHR obtained:  99  Reached 85% MPHR (min:sec):  5:45 Total Exercise Time (min-sec):  8:00  Workload in METS:  10.1 Borg Scale: 14  Reason ETT Terminated:  desired heart rate attained    ST Segment Analysis At Rest: normal ST segments - no evidence of significant ST depression With Exercise: no evidence of significant ST depression  Other Information Arrhythmia:  No Angina during ETT:  absent (0) Quality of ETT:  diagnostic  ETT Interpretation:  normal - no evidence of ischemia by ST analysis  Comments: Good exercise capacity. No chest pain. Normal BP response to exercise. No significant ST depression to suggest ischemia.  There were 2 ventricular couplets noted at peak exercise. Patient with frequent PVCs during pre-test and recovery. Good HR recovery in 1st minute post exercise at 2 mph on 2% grade.  Recommendations: F/u with Dr. Rollene Rotunda as directed. Signed,  Tereso Newcomer, PA-C   08/14/2013 3:39 PM

## 2013-09-11 ENCOUNTER — Encounter: Payer: Self-pay | Admitting: Cardiology

## 2013-10-01 NOTE — Telephone Encounter (Signed)
Left message for pt to call office regarding Dr. Jenene Slicker recommendations.

## 2013-12-16 ENCOUNTER — Other Ambulatory Visit: Payer: Self-pay | Admitting: Internal Medicine

## 2013-12-31 ENCOUNTER — Telehealth: Payer: Self-pay | Admitting: General Practice

## 2013-12-31 NOTE — Telephone Encounter (Signed)
Yes, please make sure at least 45 minute slot allotted.

## 2013-12-31 NOTE — Telephone Encounter (Signed)
Patient aware.

## 2014-01-12 ENCOUNTER — Other Ambulatory Visit: Payer: Self-pay | Admitting: Internal Medicine

## 2014-02-01 ENCOUNTER — Ambulatory Visit (INDEPENDENT_AMBULATORY_CARE_PROVIDER_SITE_OTHER): Payer: 59 | Admitting: General Practice

## 2014-02-01 ENCOUNTER — Encounter: Payer: Self-pay | Admitting: General Practice

## 2014-02-01 VITALS — BP 128/67 | HR 68 | Temp 97.8°F | Ht 60.0 in | Wt 158.6 lb

## 2014-02-01 DIAGNOSIS — I10 Essential (primary) hypertension: Secondary | ICD-10-CM

## 2014-02-01 DIAGNOSIS — K219 Gastro-esophageal reflux disease without esophagitis: Secondary | ICD-10-CM | POA: Insufficient documentation

## 2014-02-01 DIAGNOSIS — Z833 Family history of diabetes mellitus: Secondary | ICD-10-CM

## 2014-02-01 DIAGNOSIS — J309 Allergic rhinitis, unspecified: Secondary | ICD-10-CM | POA: Insufficient documentation

## 2014-02-01 DIAGNOSIS — Z0000 Encounter for general adult medical examination without abnormal findings: Secondary | ICD-10-CM

## 2014-02-01 LAB — POCT CBC
Granulocyte percent: 51.8 %G (ref 37–80)
HCT, POC: 44.1 % (ref 37.7–47.9)
Hemoglobin: 14.2 g/dL (ref 12.2–16.2)
Lymph, poc: 2.1 (ref 0.6–3.4)
MCH, POC: 28.2 pg (ref 27–31.2)
MCHC: 32.3 g/dL (ref 31.8–35.4)
MCV: 87.2 fL (ref 80–97)
MPV: 7.3 fL (ref 0–99.8)
POC Granulocyte: 2.7 (ref 2–6.9)
POC LYMPH PERCENT: 40.6 %L (ref 10–50)
Platelet Count, POC: 199 10*3/uL (ref 142–424)
RBC: 5.1 M/uL (ref 4.04–5.48)
RDW, POC: 12.9 %
WBC: 5.2 10*3/uL (ref 4.6–10.2)

## 2014-02-01 LAB — POCT GLYCOSYLATED HEMOGLOBIN (HGB A1C): Hemoglobin A1C: 5.5

## 2014-02-01 NOTE — Patient Instructions (Signed)

## 2014-02-01 NOTE — Progress Notes (Signed)
   Subjective:    Patient ID: [name], female    DOB: 24-Aug-1946, 68 y.o.   MRN: [id number]  HPI Patient presents today to establish care. History of htn, gerd, ibs, allergic rhinitis, and hyperlipidemia. Denies taking metoprolol as prescribed, but taking others as prescribed. Denies eating a healthy diet. Reports exercising 5 days a week.     Review of Systems  Constitutional: Negative for fever and chills.  Respiratory: Negative for chest tightness and shortness of breath.   Cardiovascular: Negative for chest pain and palpitations.  Gastrointestinal: Negative for nausea, vomiting, abdominal pain, diarrhea, constipation and blood in stool.  Genitourinary: Negative for dysuria and difficulty urinating.  Neurological: Negative for dizziness, weakness and headaches.       Objective:   Physical Exam  Constitutional: She is oriented to person, place, and time. She appears well-developed and well-nourished.  HENT:  Head: Normocephalic and atraumatic.  Right Ear: External ear normal.  Left Ear: External ear normal.  Mouth/Throat: Oropharynx is clear and moist.  Eyes: EOM are normal. Pupils are equal, round, and reactive to light.  Neck: Normal range of motion. Neck supple.  Cardiovascular: Normal rate, regular rhythm and normal heart sounds.   Pulmonary/Chest: Effort normal and breath sounds normal. No respiratory distress. She exhibits no tenderness.  Abdominal: Soft. Bowel sounds are normal. She exhibits no distension. There is no tenderness.  Neurological: She is alert and oriented to person, place, and time.  Skin: Skin is warm and dry.  Psychiatric: She has a normal mood and affect.          Assessment & Plan:  1. Annual physical exam  - POCT CBC  2. Hypertension  - CMP14+EGFR - Lipid panel  3. Family history of diabetes mellitus  - POCT glycosylated hemoglobin (Hb A1C)  4. GERD (gastroesophageal reflux disease)   5. Allergic rhinitis Continue all  current medications Labs pending F/u in 3 months Discussed benefits of regular exercise and healthy eating Patient verbalized understanding Erby Pian, FNP-C

## 2014-02-02 LAB — LIPID PANEL
Chol/HDL Ratio: 3 ratio units (ref 0.0–4.4)
Cholesterol, Total: 193 mg/dL (ref 100–199)
HDL: 65 mg/dL (ref >39)
LDL Calculated: 110 mg/dL — ABNORMAL HIGH (ref 0–99)
Triglycerides: 90 mg/dL (ref 0–149)
VLDL Cholesterol Cal: 18 mg/dL (ref 5–40)

## 2014-02-02 LAB — CMP14+EGFR
ALT: 14 IU/L (ref 0–32)
AST: 18 IU/L (ref 0–40)
Albumin/Globulin Ratio: 2 (ref 1.1–2.5)
Albumin: 4.3 g/dL (ref 3.6–4.8)
Alkaline Phosphatase: 56 IU/L (ref 39–117)
BUN/Creatinine Ratio: 18 (ref 11–26)
BUN: 14 mg/dL (ref 8–27)
CO2: 26 mmol/L (ref 18–29)
Calcium: 9.8 mg/dL (ref 8.7–10.3)
Chloride: 102 mmol/L (ref 97–108)
Creatinine, Ser: 0.78 mg/dL (ref 0.57–1.00)
GFR calc Af Amer: 92 mL/min/{1.73_m2} (ref >59)
GFR calc non Af Amer: 79 mL/min/{1.73_m2} (ref >59)
Globulin, Total: 2.2 g/dL (ref 1.5–4.5)
Glucose: 78 mg/dL (ref 65–99)
Potassium: 4.6 mmol/L (ref 3.5–5.2)
Sodium: 143 mmol/L (ref 134–144)
Total Bilirubin: 0.5 mg/dL (ref 0.0–1.2)
Total Protein: 6.5 g/dL (ref 6.0–8.5)

## 2014-02-07 ENCOUNTER — Ambulatory Visit (INDEPENDENT_AMBULATORY_CARE_PROVIDER_SITE_OTHER): Payer: 59 | Admitting: Nurse Practitioner

## 2014-02-07 ENCOUNTER — Encounter: Payer: Self-pay | Admitting: Nurse Practitioner

## 2014-02-07 VITALS — BP 142/83 | HR 66 | Temp 97.1°F | Ht 60.5 in | Wt 158.2 lb

## 2014-02-07 DIAGNOSIS — L259 Unspecified contact dermatitis, unspecified cause: Secondary | ICD-10-CM

## 2014-02-07 MED ORDER — PREDNISONE 20 MG PO TABS: ORAL_TABLET | ORAL | Status: DC

## 2014-02-07 MED ORDER — METHYLPREDNISOLONE ACETATE 80 MG/ML IJ SUSP
80.0000 mg | Freq: Once | INTRAMUSCULAR | Status: AC
  Administered 2014-02-07: 80 mg via INTRAMUSCULAR

## 2014-02-07 NOTE — Patient Instructions (Signed)

## 2014-02-07 NOTE — Progress Notes (Signed)
   Subjective:    Patient ID: [name], female    DOB: 25-Jan-1946, 68 y.o.   MRN: [id number]  HPI  Patient has been doing yard work and got iinto some poison oak and has broken out in a rash - itching and spreading    Review of Systems  Constitutional: Negative.   HENT: Negative.   Respiratory: Negative.   Cardiovascular: Negative.   Genitourinary: Negative.   Psychiatric/Behavioral: Negative.   All other systems reviewed and are negative.      Objective:   Physical Exam  Constitutional: She appears well-developed and well-nourished.  Cardiovascular: Normal rate and normal heart sounds.   Pulmonary/Chest: Effort normal and breath sounds normal.  Skin: Skin is warm.  Erythematous vesicular lesions in oinear pattern on bil forearms and back  Psychiatric: She has a normal mood and affect. Her behavior is normal. Judgment and thought content normal.   BP 142/83  Pulse 66  Temp(Src) 97.1 F (36.2 C) (Oral)  Ht 5' 0.5" (1.537 m)  Wt 158 lb 3.2 oz (71.759 kg)  BMI 30.38 kg/m2        Assessment & Plan:   1. Contact dermatitis    Meds ordered this encounter  Medications  . methylPREDNISolone acetate (DEPO-MEDROL) injection 80 mg    Sig:   . predniSONE (DELTASONE) 20 MG tablet    Sig: 2 tab at the sametime daily for 5 days- start Friday    Dispense:  10 tablet    Refill:  0    Order Specific Question:  Supervising Provider    Answer:  Chipper Herb [[id number]]   Avoid scratches Calamine lotion Cool compresses RTO prn  Mary-Margaret Hassell Done, FNP

## 2014-02-09 ENCOUNTER — Encounter: Payer: Self-pay | Admitting: General Practice

## 2014-02-09 ENCOUNTER — Ambulatory Visit (INDEPENDENT_AMBULATORY_CARE_PROVIDER_SITE_OTHER): Payer: 59 | Admitting: General Practice

## 2014-02-09 VITALS — BP 131/71 | HR 77 | Temp 97.9°F | Ht 65.0 in | Wt 158.0 lb

## 2014-02-09 DIAGNOSIS — L309 Dermatitis, unspecified: Secondary | ICD-10-CM

## 2014-02-09 DIAGNOSIS — L259 Unspecified contact dermatitis, unspecified cause: Secondary | ICD-10-CM

## 2014-02-09 NOTE — Patient Instructions (Signed)
Contact Dermatitis Contact dermatitis is a rash that happens when something touches the skin. You touched something that irritates your skin, or you have allergies to something you touched. HOME CARE   Avoid the thing that caused your rash.  Keep your rash away from hot water, soap, sunlight, chemicals, and other things that might bother it.  Do not scratch your rash.  You can take cool baths to help stop itching.  Only take medicine as told by your doctor.  Keep all doctor visits as told. GET HELP RIGHT AWAY IF:   Your rash is not better after 3 days.  Your rash gets worse.  Your rash is puffy (swollen), tender, red, sore, or warm.  You have problems with your medicine. MAKE SURE YOU:   Understand these instructions.  Will watch your condition.  Will get help right away if you are not doing well or get worse. Document Released: 07/25/2009 Document Revised: 12/20/2011 Document Reviewed: 03/02/2011 [facility] Patient Information 2014 [place], [place].

## 2014-02-09 NOTE — Progress Notes (Signed)
   Subjective:    Patient ID: [name], female    DOB: [birth year], 68 y.o.   MRN: [id number]  HPI Patient presents today with complaints of none improving contact dermatitis rash. Reports onset of rash was 2-3 days ago after working in yard and flower bed. She was given depo medrol and prednisone dose pack on 02/07/14. Reports taking first dose of prednisone dose pack on yesterday. Reports having this type rash in the past and had to be seen by dermatology. Denies ability to take benadryl due to allergic reaction, but able to take claritin.     Review of Systems  Constitutional: Negative for fever and chills.  Respiratory: Negative for chest tightness and shortness of breath.   Cardiovascular: Negative for chest pain and palpitations.  Gastrointestinal: Negative for abdominal pain.  Skin: Positive for rash.       Rash to right lower inner forearm  Neurological: Negative for dizziness, weakness and headaches.       Objective:   Physical Exam  Constitutional: She is oriented to person, place, and time. She appears well-developed and well-nourished.  Cardiovascular: Normal rate, regular rhythm and normal heart sounds.   Pulmonary/Chest: Effort normal and breath sounds normal. No respiratory distress. She exhibits no tenderness.  Neurological: She is alert and oriented to person, place, and time.  Skin: Skin is warm and dry. Rash noted. Rash is maculopapular and vesicular. There is erythema.     Erythematous maculopapular, vesicular rash to right inner lower arm, measuring 4 inches x 2 inches. Negative drainage.   Psychiatric: She has a normal mood and affect.          Assessment & Plan:  1. Dermatitis -Continue current medications -may use triamcinolone cream 0.025% bid for 7 days (patient has prescription at home) -claritin as directed -- Ambulatory referral to Dermatology -may seek emergency medical treatment -Patient verbalized understanding Erby Pian,  FNP-C

## 2014-02-11 ENCOUNTER — Telehealth: Payer: Self-pay | Admitting: Nurse Practitioner

## 2014-02-11 NOTE — Telephone Encounter (Signed)
Ok

## 2014-05-05 ENCOUNTER — Other Ambulatory Visit: Payer: Self-pay | Admitting: Internal Medicine

## 2014-05-20 ENCOUNTER — Telehealth: Payer: Self-pay | Admitting: Nurse Practitioner

## 2014-05-20 NOTE — Telephone Encounter (Signed)
appt given for 8:15 in the am

## 2014-05-22 ENCOUNTER — Encounter: Payer: Self-pay | Admitting: Gastroenterology

## 2014-06-03 ENCOUNTER — Ambulatory Visit: Payer: 59 | Admitting: Nurse Practitioner

## 2014-11-21 ENCOUNTER — Telehealth: Payer: Self-pay | Admitting: Nurse Practitioner

## 2014-12-09 ENCOUNTER — Encounter: Payer: Self-pay | Admitting: *Deleted

## 2014-12-11 NOTE — Telephone Encounter (Signed)
I told Amy & she called pt to take care of them.

## 2015-01-17 ENCOUNTER — Encounter: Payer: Self-pay | Admitting: Family Medicine

## 2015-01-17 ENCOUNTER — Ambulatory Visit (INDEPENDENT_AMBULATORY_CARE_PROVIDER_SITE_OTHER): Payer: 59 | Admitting: Family Medicine

## 2015-01-17 ENCOUNTER — Ambulatory Visit (INDEPENDENT_AMBULATORY_CARE_PROVIDER_SITE_OTHER): Payer: 59

## 2015-01-17 VITALS — BP 118/71 | HR 71 | Temp 97.0°F | Ht 59.5 in | Wt 156.4 lb

## 2015-01-17 DIAGNOSIS — Z0000 Encounter for general adult medical examination without abnormal findings: Secondary | ICD-10-CM | POA: Diagnosis not present

## 2015-01-17 DIAGNOSIS — Z23 Encounter for immunization: Secondary | ICD-10-CM

## 2015-01-17 DIAGNOSIS — M899 Disorder of bone, unspecified: Secondary | ICD-10-CM

## 2015-01-17 DIAGNOSIS — R7309 Other abnormal glucose: Secondary | ICD-10-CM

## 2015-01-17 DIAGNOSIS — Z780 Asymptomatic menopausal state: Secondary | ICD-10-CM

## 2015-01-17 DIAGNOSIS — R7303 Prediabetes: Secondary | ICD-10-CM

## 2015-01-17 DIAGNOSIS — Z1212 Encounter for screening for malignant neoplasm of rectum: Secondary | ICD-10-CM

## 2015-01-17 LAB — POCT CBC
Granulocyte percent: 49 %G (ref 37–80)
HCT, POC: 47.2 % (ref 37.7–47.9)
Hemoglobin: 14.6 g/dL (ref 12.2–16.2)
Lymph, poc: 2.5 (ref 0.6–3.4)
MCH, POC: 27.1 pg (ref 27–31.2)
MCHC: 30.9 g/dL — AB (ref 31.8–35.4)
MCV: 87.7 fL (ref 80–97)
MPV: 7.7 fL (ref 0–99.8)
POC Granulocyte: 2.6 (ref 2–6.9)
POC LYMPH PERCENT: 46.8 %L (ref 10–50)
Platelet Count, POC: 210 10*3/uL (ref 142–424)
RBC: 5.38 M/uL (ref 4.04–5.48)
RDW, POC: 12.8 %
WBC: 5.4 10*3/uL (ref 4.6–10.2)

## 2015-01-17 MED ORDER — HYOSCYAMINE SULFATE 0.125 MG SL SUBL: SUBLINGUAL_TABLET | SUBLINGUAL | Status: DC

## 2015-01-17 MED ORDER — HYOSCYAMINE SULFATE ER 0.375 MG PO TB12: 0.3750 mg | ORAL_TABLET | Freq: Two times a day (BID) | ORAL | Status: DC | PRN
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
## 2015-01-18 LAB — THYROID PANEL WITH TSH
Free Thyroxine Index: 2.2 (ref 1.2–4.9)
T3 Uptake Ratio: 28 % (ref 24–39)
T4, Total: 7.8 ug/dL (ref 4.5–12.0)
TSH: 4.26 u[IU]/mL (ref 0.450–4.500)

## 2015-01-18 LAB — CMP14+EGFR
ALT: 16 IU/L (ref 0–32)
AST: 18 IU/L (ref 0–40)
Albumin/Globulin Ratio: 1.9 (ref 1.1–2.5)
Albumin: 4.3 g/dL (ref 3.6–4.8)
Alkaline Phosphatase: 61 IU/L (ref 39–117)
BUN/Creatinine Ratio: 18 (ref 11–26)
BUN: 15 mg/dL (ref 8–27)
Bilirubin Total: 0.6 mg/dL (ref 0.0–1.2)
CO2: 24 mmol/L (ref 18–29)
Calcium: 9.5 mg/dL (ref 8.7–10.3)
Chloride: 103 mmol/L (ref 97–108)
Creatinine, Ser: 0.84 mg/dL (ref 0.57–1.00)
GFR calc Af Amer: 83 mL/min/{1.73_m2} (ref >59)
GFR calc non Af Amer: 72 mL/min/{1.73_m2} (ref >59)
Globulin, Total: 2.3 g/dL (ref 1.5–4.5)
Glucose: 86 mg/dL (ref 65–99)
Potassium: 4.3 mmol/L (ref 3.5–5.2)
Sodium: 143 mmol/L (ref 134–144)
Total Protein: 6.6 g/dL (ref 6.0–8.5)

## 2015-01-18 LAB — NMR, LIPOPROFILE
Cholesterol: 216 mg/dL — ABNORMAL HIGH (ref 100–199)
HDL Cholesterol by NMR: 65 mg/dL (ref >39)
HDL Particle Number: 42.3 umol/L (ref >=30.5)
LDL Particle Number: 1597 nmol/L — ABNORMAL HIGH (ref <1000)
LDL Size: 20.9 nm (ref >20.5)
LDL-C: 127 mg/dL — ABNORMAL HIGH (ref 0–99)
LP-IR Score: <25 (ref <=45)
Small LDL Particle Number: 660 nmol/L — ABNORMAL HIGH (ref <=527)
Triglycerides by NMR: 122 mg/dL (ref 0–149)

## 2015-01-18 LAB — VITAMIN D 25 HYDROXY (VIT D DEFICIENCY, FRACTURES): Vit D, 25-Hydroxy: 52 ng/mL (ref 30.0–100.0)

## 2015-01-19 ENCOUNTER — Other Ambulatory Visit: Payer: Self-pay | Admitting: Family Medicine

## 2015-01-19 LAB — FECAL OCCULT BLOOD, IMMUNOCHEMICAL: Fecal Occult Bld: NEGATIVE

## 2015-01-19 MED ORDER — ATORVASTATIN CALCIUM 40 MG PO TABS: 40.0000 mg | ORAL_TABLET | Freq: Every day | ORAL | Status: DC

## 2015-01-20 ENCOUNTER — Telehealth: Payer: Self-pay | Admitting: Family Medicine

## 2015-01-20 DIAGNOSIS — H6591 Unspecified nonsuppurative otitis media, right ear: Secondary | ICD-10-CM

## 2015-01-20 DIAGNOSIS — H7291 Unspecified perforation of tympanic membrane, right ear: Secondary | ICD-10-CM

## 2015-01-20 LAB — PAP IG W/ RFLX HPV ASCU: PAP Smear Comment: 0

## 2015-01-20 MED ORDER — ESTRADIOL 0.1 MG/GM VA CREA: 1.0000 | TOPICAL_CREAM | Freq: Every day | VAGINAL | Status: DC

## 2015-01-20 NOTE — Telephone Encounter (Signed)
Pt saw you on Friday, states there was mention of her ear drum on the verge of bursting and she had problems over the weekend and was wondering if we could get her a referral to the ENT quicker.  Pt also stated that hormone cream which was discussed was not sent into [place]

## 2015-01-20 NOTE — Telephone Encounter (Signed)
Prescription sent and referral ordered. 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 It is not on the verge of bursting and she is in no imminent danger.

## 2015-01-24 ENCOUNTER — Other Ambulatory Visit: Payer: Self-pay | Admitting: Family Medicine

## 2015-01-24 MED ORDER — ESTRADIOL 10 MCG VA TABS: ORAL_TABLET | VAGINAL | Status: DC

## 2015-01-27 ENCOUNTER — Telehealth: Payer: Self-pay | Admitting: Family Medicine

## 2015-01-27 NOTE — Telephone Encounter (Signed)
Patient aware and insurance will fax a prior authorization.

## 2015-01-27 NOTE — Telephone Encounter (Signed)
I don't know of any cheaper safe alternative

## 2015-01-27 NOTE — Telephone Encounter (Signed)
Patient states that the vaginal cream was over 200.00 and the vagifem was over 300.00 and that she can not afford them.

## 2015-02-03 ENCOUNTER — Encounter: Payer: Self-pay | Admitting: Family Medicine

## 2015-04-15 ENCOUNTER — Other Ambulatory Visit: Payer: Self-pay | Admitting: Nurse Practitioner

## 2015-05-13 ENCOUNTER — Other Ambulatory Visit: Payer: Self-pay | Admitting: Nurse Practitioner

## 2015-07-03 ENCOUNTER — Encounter: Payer: Self-pay | Admitting: Nurse Practitioner

## 2015-07-03 ENCOUNTER — Ambulatory Visit (INDEPENDENT_AMBULATORY_CARE_PROVIDER_SITE_OTHER): Payer: 59

## 2015-07-03 ENCOUNTER — Ambulatory Visit (INDEPENDENT_AMBULATORY_CARE_PROVIDER_SITE_OTHER): Payer: 59 | Admitting: Nurse Practitioner

## 2015-07-03 VITALS — BP 144/86 | HR 85 | Temp 97.4°F | Ht 60.0 in | Wt 154.0 lb

## 2015-07-03 DIAGNOSIS — M25551 Pain in right hip: Secondary | ICD-10-CM

## 2015-07-03 DIAGNOSIS — M5431 Sciatica, right side: Secondary | ICD-10-CM | POA: Diagnosis not present

## 2015-07-03 DIAGNOSIS — M5430 Sciatica, unspecified side: Secondary | ICD-10-CM | POA: Insufficient documentation

## 2015-07-03 MED ORDER — PREDNISONE 20 MG PO TABS: ORAL_TABLET | ORAL | Status: DC

## 2015-07-03 MED ORDER — METHYLPREDNISOLONE ACETATE 80 MG/ML IJ SUSP
80.0000 mg | Freq: Once | INTRAMUSCULAR | Status: AC
  Administered 2015-07-03: 80 mg via INTRAMUSCULAR

## 2015-07-03 NOTE — Progress Notes (Signed)
   Subjective:    Patient ID: [name], female    DOB: [birth year], 69 y.o.   MRN: [id number]  HPI Patient in today c/o of right hip pain. Pain radiates to buttocks and down leg- Pain is worse when she is siting. Rates pain 2/10 and is constant. Laying down or standing decreases pain. Has not tried any medication. Has the worse pain at achilles tendon on right.    Review of Systems  Constitutional: Negative.   HENT: Negative.   Respiratory: Negative.   Cardiovascular: Negative.   Genitourinary: Negative.   Neurological: Negative.   Psychiatric/Behavioral: Negative.   All other systems reviewed and are negative.      Objective:   Physical Exam  Constitutional: She is oriented to person, place, and time. She appears well-developed and well-nourished.  Cardiovascular: Normal rate and normal heart sounds.   Pulmonary/Chest: Effort normal and breath sounds normal.  Musculoskeletal:  FROM of right hip without pain Pian at base of right buttocks SLight tenderness right achilles tendon- no edema  Neurological: She is alert and oriented to person, place, and time. She has normal reflexes.  Skin: Skin is warm.  Psychiatric: She has a normal mood and affect. Her behavior is normal. Judgment and thought content normal.    BP 144/86 mmHg  Pulse 85  Temp(Src) 97.4 F (36.3 C) (Oral)  Ht 5' (1.524 m)  Wt 154 lb (69.854 kg)  BMI 30.08 kg/m2  Right hip x ray- normal-Preliminary reading by Ronnald Collum, FNP  [facility]      Assessment & Plan:   1. Hip pain, right   2. Sciatica, right    Meds ordered this encounter  Medications  . predniSONE (DELTASONE) 20 MG tablet    Sig: 2 po at sametime daily for 5 days    Dispense:  10 tablet    Refill:  0    Order Specific Question:  Supervising Provider    Answer:  Chipper Herb [[id number]]  . methylPREDNISolone acetate (DEPO-MEDROL) injection 80 mg    Sig:    Moist heat Rest Exercises  RTO prn  Mary-Margaret Hassell Done, FNP

## 2015-07-03 NOTE — Patient Instructions (Signed)
Sciatica Sciatica is pain, weakness, numbness, or tingling along the path of the sciatic nerve. The nerve starts in the lower back and runs down the back of each leg. The nerve controls the muscles in the lower leg and in the back of the knee, while also providing sensation to the back of the thigh, lower leg, and the sole of your foot. Sciatica is a symptom of another medical condition. For instance, nerve damage or certain conditions, such as a herniated disk or bone spur on the spine, pinch or put pressure on the sciatic nerve. This causes the pain, weakness, or other sensations normally associated with sciatica. Generally, sciatica only affects one side of the body. CAUSES   Herniated or slipped disc.  Degenerative disk disease.  A pain disorder involving the narrow muscle in the buttocks (piriformis syndrome).  Pelvic injury or fracture.  Pregnancy.  Tumor (rare). SYMPTOMS  Symptoms can vary from mild to very severe. The symptoms usually travel from the low back to the buttocks and down the back of the leg. Symptoms can include:  Mild tingling or dull aches in the lower back, leg, or hip.  Numbness in the back of the calf or sole of the foot.  Burning sensations in the lower back, leg, or hip.  Sharp pains in the lower back, leg, or hip.  Leg weakness.  Severe back pain inhibiting movement. These symptoms may get worse with coughing, sneezing, laughing, or prolonged sitting or standing. Also, being overweight may worsen symptoms. DIAGNOSIS  Your caregiver will perform a physical exam to look for common symptoms of sciatica. He or she may ask you to do certain movements or activities that would trigger sciatic nerve pain. Other tests may be performed to find the cause of the sciatica. These may include:  Blood tests.  X-rays.  Imaging tests, such as an MRI or CT scan. TREATMENT  Treatment is directed at the cause of the sciatic pain. Sometimes, treatment is not necessary  and the pain and discomfort goes away on its own. If treatment is needed, your caregiver may suggest:  Over-the-counter medicines to relieve pain.  Prescription medicines, such as anti-inflammatory medicine, muscle relaxants, or narcotics.  Applying heat or ice to the painful area.  Steroid injections to lessen pain, irritation, and inflammation around the nerve.  Reducing activity during periods of pain.  Exercising and stretching to strengthen your abdomen and improve flexibility of your spine. Your caregiver may suggest losing weight if the extra weight makes the back pain worse.  Physical therapy.  Surgery to eliminate what is pressing or pinching the nerve, such as a bone spur or part of a herniated disk. HOME CARE INSTRUCTIONS   Only take over-the-counter or prescription medicines for pain or discomfort as directed by your caregiver.  Apply ice to the affected area for 20 minutes, 3-4 times a day for the first 48-72 hours. Then try heat in the same way.  Exercise, stretch, or perform your usual activities if these do not aggravate your pain.  Attend physical therapy sessions as directed by your caregiver.  Keep all follow-up appointments as directed by your caregiver.  Do not wear high heels or shoes that do not provide proper support.  Check your mattress to see if it is too soft. A firm mattress may lessen your pain and discomfort. SEEK IMMEDIATE MEDICAL CARE IF:   You lose control of your bowel or bladder (incontinence).  You have increasing weakness in the lower back, pelvis, buttocks,   or legs.  You have redness or swelling of your back.  You have a burning sensation when you urinate.  You have pain that gets worse when you lie down or awakens you at night.  Your pain is worse than you have experienced in the past.  Your pain is lasting longer than 4 weeks.  You are suddenly losing weight without reason. MAKE SURE YOU:  Understand these  instructions.  Will watch your condition.  Will get help right away if you are not doing well or get worse. Document Released: 09/21/2001 Document Revised: 03/28/2012 Document Reviewed: 02/06/2012 Exit[employer] Patient Information 2015 [employer]. This information is not intended to replace advice given to you by your health care provider. Make sure you discuss any questions you have with your health care provider.  

## 2015-08-12 ENCOUNTER — Telehealth: Payer: Self-pay | Admitting: Nurse Practitioner

## 2015-08-15 NOTE — Telephone Encounter (Signed)
Pt is scheduled to come in

## 2015-08-19 ENCOUNTER — Encounter: Payer: Self-pay | Admitting: Internal Medicine

## 2015-08-22 ENCOUNTER — Ambulatory Visit (INDEPENDENT_AMBULATORY_CARE_PROVIDER_SITE_OTHER): Payer: 59 | Admitting: Family Medicine

## 2015-08-22 ENCOUNTER — Encounter: Payer: Self-pay | Admitting: Family Medicine

## 2015-08-22 VITALS — BP 143/85 | HR 74 | Temp 97.7°F | Ht 60.0 in | Wt 152.0 lb

## 2015-08-22 DIAGNOSIS — I10 Essential (primary) hypertension: Secondary | ICD-10-CM | POA: Diagnosis not present

## 2015-08-22 DIAGNOSIS — K588 Other irritable bowel syndrome: Secondary | ICD-10-CM

## 2015-08-22 DIAGNOSIS — E739 Lactose intolerance, unspecified: Secondary | ICD-10-CM | POA: Diagnosis not present

## 2015-08-22 DIAGNOSIS — E785 Hyperlipidemia, unspecified: Secondary | ICD-10-CM

## 2015-08-22 LAB — POCT GLYCOSYLATED HEMOGLOBIN (HGB A1C): Hemoglobin A1C: 5.7

## 2015-08-22 MED ORDER — METOPROLOL TARTRATE 25 MG PO TABS: 12.5000 mg | ORAL_TABLET | Freq: Two times a day (BID) | ORAL | Status: DC

## 2015-08-22 MED ORDER — HYOSCYAMINE SULFATE 0.125 MG SL SUBL: SUBLINGUAL_TABLET | SUBLINGUAL | Status: DC

## 2015-08-22 MED ORDER — NITROGLYCERIN 0.4 MG SL SUBL: 0.4000 mg | SUBLINGUAL_TABLET | SUBLINGUAL | Status: DC | PRN

## 2015-08-22 NOTE — Assessment & Plan Note (Signed)
Stop statin on her own, will check labs

## 2015-08-22 NOTE — Assessment & Plan Note (Signed)
Has never been on medications, we will check a hemoglobin A1c

## 2015-08-22 NOTE — Assessment & Plan Note (Signed)
BP elevated consistently, will start metoprolol 12.5 mg twice a day because patient also has sporadic arrhythmias.

## 2015-08-22 NOTE — Progress Notes (Signed)
BP 143/87 mmHg  Pulse 74  Temp(Src) 97.7 F (36.5 C) (Oral)  Ht 5' (1.524 m)  Wt 152 lb (68.947 kg)  BMI 29.69 kg/m2   Subjective:    Patient ID: [name], female    DOB: 09-05-1946, 69 y.o.   MRN: [id number]  HPI: [name] is a 68 y.o. female presenting on 08/22/2015 for 6 month followup and Leg cramps   HPI Hypertension Patient presents today for a hypertension recheck. Her blood pressure is 143/87 and 144 after repeat, she gets similar numbers at home. She denies any headaches or chest pain or visual problems or focal numbness or weakness.  Glucose intolerance Patient comes in for recheck her hemoglobin A1c because she has glucose intolerance or prediabetes. She feels she has made some dietary changes and wants to know where she lives.  Hyperlipidemia Patient was taking co-every 10 and Lipitor and stopped both multiple months ago because she was having joint pains and muscle aches. All of those stopped after she stopped the medication.  Relevant past medical, surgical, family and social history reviewed and updated as indicated. Interim medical history since our last visit reviewed. Allergies and medications reviewed and updated.  Review of Systems  Constitutional: Negative for fever and chills.  HENT: Negative for congestion, ear discharge and ear pain.   Eyes: Negative for redness and visual disturbance.  Respiratory: Negative for chest tightness and shortness of breath.   Cardiovascular: Negative for chest pain, palpitations and leg swelling.  Endocrine: Negative for cold intolerance, heat intolerance, polydipsia and polyuria.  Genitourinary: Negative for dysuria and difficulty urinating.  Musculoskeletal: Negative for back pain and gait problem.  Skin: Negative for rash.  Neurological: Negative for dizziness, light-headedness and headaches.  Psychiatric/Behavioral: Negative for behavioral problems and agitation.  All other systems reviewed and are  negative.   Per HPI unless specifically indicated above     Medication List       This list is accurate as of: 08/22/15  9:44 AM.  Always use your most recent med list.               docusate sodium 100 MG capsule  Commonly known as:  COLACE  Take 100 mg by mouth daily as needed.     hyoscyamine 0.375 MG 12 hr tablet  Commonly known as:  LEVBID  Take 1 tablet (0.375 mg total) by mouth 2 (two) times daily as needed.     hyoscyamine 0.125 MG SL tablet  Commonly known as:  LEVSIN SL  DISSOLVE ONE TABLET UNDER TONGUE EVERY EIGHT HOURS     LASTACAFT 0.25 % Soln  Generic drug:  Alcaftadine  Apply to eye.     mometasone 50 MCG/ACT nasal spray  Commonly known as:  NASONEX  2 sprays into each nostril once daily     SALINE NASAL SPRAY NA  Place into the nose as directed.     TYLENOL 500 MG tablet  Generic drug:  acetaminophen  Take 500 mg by mouth as needed.           Objective:    BP 143/87 mmHg  Pulse 74  Temp(Src) 97.7 F (36.5 C) (Oral)  Ht 5' (1.524 m)  Wt 152 lb (68.947 kg)  BMI 29.69 kg/m2  Wt Readings from Last 3 Encounters:  08/22/15 152 lb (68.947 kg)  07/03/15 154 lb (69.854 kg)  01/17/15 156 lb 6.4 oz (70.943 kg)    Physical Exam  Constitutional: She is oriented to  person, place, and time. She appears well-developed and well-nourished. No distress.  Eyes: Conjunctivae and EOM are normal. Pupils are equal, round, and reactive to light.  Neck: Neck supple. No thyromegaly present.  Cardiovascular: Normal rate, regular rhythm, normal heart sounds and intact distal pulses.   No murmur heard. Pulmonary/Chest: Effort normal and breath sounds normal. No respiratory distress. She has no wheezes.  Musculoskeletal: Normal range of motion. She exhibits no edema or tenderness.  Lymphadenopathy:    She has no cervical adenopathy.  Neurological: She is alert and oriented to person, place, and time. Coordination normal.  Skin: Skin is warm and dry. No rash  noted. She is not diaphoretic.  Psychiatric: She has a normal mood and affect. Her behavior is normal.  Nursing note and vitals reviewed.   Results for orders placed or performed in visit on 01/17/15  Fecal occult blood, imunochemical  Result Value Ref Range   Fecal Occult Bld Negative Negative  CMP14+EGFR  Result Value Ref Range   Glucose 86 65 - 99 mg/dL   BUN 15 8 - 27 mg/dL   Creatinine, Ser 0.84 0.57 - 1.00 mg/dL   GFR calc non Af Amer 72 >59 mL/min/1.73   GFR calc Af Amer 83 >59 mL/min/1.73   BUN/Creatinine Ratio 18 11 - 26   Sodium 143 134 - 144 mmol/L   Potassium 4.3 3.5 - 5.2 mmol/L   Chloride 103 97 - 108 mmol/L   CO2 24 18 - 29 mmol/L   Calcium 9.5 8.7 - 10.3 mg/dL   Total Protein 6.6 6.0 - 8.5 g/dL   Albumin 4.3 3.6 - 4.8 g/dL   Globulin, Total 2.3 1.5 - 4.5 g/dL   Albumin/Globulin Ratio 1.9 1.1 - 2.5   Bilirubin Total 0.6 0.0 - 1.2 mg/dL   Alkaline Phosphatase 61 39 - 117 IU/L   AST 18 0 - 40 IU/L   ALT 16 0 - 32 IU/L  NMR, lipoprofile  Result Value Ref Range   LDL Particle Number 1597 (H) <1000 nmol/L   LDL-C 127 (H) 0 - 99 mg/dL   HDL Cholesterol by NMR 65 >39 mg/dL   Triglycerides by NMR 122 0 - 149 mg/dL   Cholesterol 216 (H) 100 - 199 mg/dL   HDL Particle Number 42.3 >=30.5 umol/L   Small LDL Particle Number 660 (H) <=527 nmol/L   LDL Size 20.9 >20.5 nm   LP-IR Score <25 <=45  Thyroid Panel With TSH  Result Value Ref Range   TSH 4.260 0.450 - 4.500 uIU/mL   T4, Total 7.8 4.5 - 12.0 ug/dL   T3 Uptake Ratio 28 24 - 39 %   Free Thyroxine Index 2.2 1.2 - 4.9  Vit D  25 hydroxy (rtn osteoporosis monitoring)  Result Value Ref Range   Vit D, 25-Hydroxy 52.0 30.0 - 100.0 ng/mL  POCT CBC  Result Value Ref Range   WBC 5.4 4.6 - 10.2 K/uL   Lymph, poc 2.5 0.6 - 3.4   POC LYMPH PERCENT 46.8 10 - 50 %L   POC Granulocyte 2.6 2 - 6.9   Granulocyte percent 49.0 37 - 80 %G   RBC 5.38 4.04 - 5.48 M/uL   Hemoglobin 14.6 12.2 - 16.2 g/dL   HCT, POC 47.2 37.7 -  47.9 %   MCV 87.7 80 - 97 fL   MCH, POC 27.1 27 - 31.2 pg   MCHC 30.9 (A) 31.8 - 35.4 g/dL   RDW, POC 12.8 %   Platelet Count, POC  210 142 - 424 K/uL   MPV 7.7 0 - 99.8 fL  Pap IG w/ reflex to HPV when ASC-U  Result Value Ref Range   DIAGNOSIS: Comment    Specimen adequacy: Comment    CLINICIAN PROVIDED ICD10: Comment    Performed by: Comment    PAP SMEAR COMMENT .    Note: Comment    Test Methodology Comment    PAP REFLEX: Comment       Assessment & Plan:   Problem List Items Addressed This Visit      Cardiovascular and Mediastinum   Hypertension - Primary    BP elevated consistently, will start metoprolol 12.5 mg twice a day because patient also has sporadic arrhythmias.      Relevant Medications   metoprolol tartrate (LOPRESSOR) 25 MG tablet   nitroGLYCERIN (NITROSTAT) 0.4 MG SL tablet     Digestive   GLUCOSE INTOLERANCE    Has never been on medications, we will check a hemoglobin A1c      Relevant Orders   POCT glycosylated hemoglobin (Hb A1C) (Completed)   CMP14+EGFR   IRRITABLE BOWEL SYNDROME    Needs refill on medication      Relevant Medications   hyoscyamine (LEVSIN SL) 0.125 MG SL tablet     Other   Hyperlipidemia LDL goal <130    Stop statin on her own, will check labs      Relevant Medications   metoprolol tartrate (LOPRESSOR) 25 MG tablet   nitroGLYCERIN (NITROSTAT) 0.4 MG SL tablet   Other Relevant Orders   Lipid panel       Follow up plan: Return in about 6 months (around 02/19/2016), or if symptoms worsen or fail to improve, for BP recheck.  Caryl Pina, MD [facility] 08/22/2015, 9:44 AM

## 2015-08-22 NOTE — Assessment & Plan Note (Signed)
Needs refill on medication

## 2015-08-23 LAB — CMP14+EGFR
ALT: 12 IU/L (ref 0–32)
AST: 14 IU/L (ref 0–40)
Albumin/Globulin Ratio: 1.8 (ref 1.1–2.5)
Albumin: 4.2 g/dL (ref 3.6–4.8)
Alkaline Phosphatase: 59 IU/L (ref 39–117)
BUN/Creatinine Ratio: 20 (ref 11–26)
BUN: 17 mg/dL (ref 8–27)
Bilirubin Total: 0.6 mg/dL (ref 0.0–1.2)
CO2: 26 mmol/L (ref 18–29)
Calcium: 9.4 mg/dL (ref 8.7–10.3)
Chloride: 102 mmol/L (ref 97–106)
Creatinine, Ser: 0.84 mg/dL (ref 0.57–1.00)
GFR calc Af Amer: 83 mL/min/{1.73_m2} (ref >59)
GFR calc non Af Amer: 72 mL/min/{1.73_m2} (ref >59)
Globulin, Total: 2.3 g/dL (ref 1.5–4.5)
Glucose: 91 mg/dL (ref 65–99)
Potassium: 4.4 mmol/L (ref 3.5–5.2)
Sodium: 143 mmol/L (ref 136–144)
Total Protein: 6.5 g/dL (ref 6.0–8.5)

## 2015-08-23 LAB — LIPID PANEL
Chol/HDL Ratio: 3.3 ratio units (ref 0.0–4.4)
Cholesterol, Total: 210 mg/dL — ABNORMAL HIGH (ref 100–199)
HDL: 63 mg/dL (ref >39)
LDL Calculated: 124 mg/dL — ABNORMAL HIGH (ref 0–99)
Triglycerides: 113 mg/dL (ref 0–149)
VLDL Cholesterol Cal: 23 mg/dL (ref 5–40)

## 2015-08-25 NOTE — Progress Notes (Signed)
Patient informed. 

## 2015-09-25 ENCOUNTER — Ambulatory Visit (INDEPENDENT_AMBULATORY_CARE_PROVIDER_SITE_OTHER): Payer: 59 | Admitting: Pediatrics

## 2015-09-25 ENCOUNTER — Telehealth: Payer: Self-pay | Admitting: Nurse Practitioner

## 2015-09-25 VITALS — BP 136/78 | HR 78 | Temp 97.2°F | Ht 60.0 in | Wt 154.8 lb

## 2015-09-25 DIAGNOSIS — R350 Frequency of micturition: Secondary | ICD-10-CM

## 2015-09-25 LAB — POCT UA - MICROSCOPIC ONLY
Bacteria, U Microscopic: NEGATIVE
Casts, Ur, LPF, POC: NEGATIVE
Crystals, Ur, HPF, POC: NEGATIVE
Epithelial cells, urine per micros: NEGATIVE
Mucus, UA: NEGATIVE
RBC, urine, microscopic: NEGATIVE
WBC, Ur, HPF, POC: NEGATIVE
Yeast, UA: NEGATIVE

## 2015-09-25 LAB — POCT URINALYSIS DIPSTICK
Bilirubin, UA: NEGATIVE
Blood, UA: NEGATIVE
Glucose, UA: NEGATIVE
Ketones, UA: NEGATIVE
Leukocytes, UA: NEGATIVE
Nitrite, UA: NEGATIVE
Protein, UA: NEGATIVE
Spec Grav, UA: 1.01
Urobilinogen, UA: NEGATIVE
pH, UA: 6

## 2015-09-25 NOTE — Telephone Encounter (Signed)
Patient aware she will need to be seen and appointment scheduled for today at 5:45.

## 2015-09-25 NOTE — Progress Notes (Signed)
    Subjective:    Patient ID: [name], female    DOB: 01/20/1946, 69 y.o.   MRN: [id number]  CC: Urinary Tract Infection   HPI: [name] is a 69 y.o. female presenting for Urinary Tract Infection  Having some lower back pain Using bathroom more frequently than usual No fevers No abd pain, no dysuria or urgency.   Depression screen [facility] 2/9 08/22/2015 01/17/2015 02/01/2014  Decreased Interest 0 0 0  Down, Depressed, Hopeless 0 0 0  PHQ - 2 Score 0 0 0     Relevant past medical, surgical, family and social history reviewed and updated as indicated. Interim medical history since our last visit reviewed. Allergies and medications reviewed and updated.    ROS: Per HPI unless specifically indicated above  History  Smoking status  . Never Smoker   Smokeless tobacco  . Never Used    Past Medical History Patient Active Problem List   Diagnosis Date Noted  . Sciatica 07/03/2015  . GERD (gastroesophageal reflux disease) 02/01/2014  . Allergic rhinitis 02/01/2014  . Preventative health care 01/07/2011  . ANKLE PAIN, RIGHT 12/26/2009  . GLUCOSE INTOLERANCE 11/08/2008  . IRRITABLE BOWEL SYNDROME 11/08/2008  . Hyperlipidemia LDL goal <130 05/14/2008  . DEPRESSION 05/25/2007  . Hypertension 05/25/2007  . OSTEOARTHRITIS 05/25/2007  . OSTEOPENIA 05/25/2007  . COLONIC POLYPS, HX OF 05/25/2007        Objective:    BP 136/78 mmHg  Pulse 78  Temp(Src) 97.2 F (36.2 C) (Oral)  Ht 5' (1.524 m)  Wt 154 lb 12.8 oz (70.217 kg)  BMI 30.23 kg/m2  Wt Readings from Last 3 Encounters:  09/25/15 154 lb 12.8 oz (70.217 kg)  08/22/15 152 lb (68.947 kg)  07/03/15 154 lb (69.854 kg)     Gen: NAD, alert, cooperative with exam, NCAT EYES: EOMI, no scleral injection or icterus ENT:  TMs pearly gray b/l, OP without erythema CV: NRRR, normal S1/S2, no murmur, distal pulses 2+ b/l Resp: CTABL, no wheezes, normal WOB Abd: +BS, soft, NTND. no guarding or organomegaly, no CVA  tenderness Ext: No edema, warm Neuro: Alert and oriented, strength equal b/l UE and LE, coordination grossly normal MSK: normal muscle bulk     Assessment & Plan:    [name] was seen today for back pain, urine was negative for infection. Pt works at home, sitting for much of the day. Rec starting gentle regular back exercises throughout the day. RTC if not improving.  Diagnoses and all orders for this visit:  Frequency of urination -     POCT urinalysis dipstick -     POCT UA - Microscopic Only     Follow up plan: prn  Tara Found, MD [facility] 09/25/2015, 6:50 PM

## 2015-09-27 ENCOUNTER — Encounter: Payer: Self-pay | Admitting: Pediatrics

## 2015-11-20 ENCOUNTER — Other Ambulatory Visit: Payer: Self-pay

## 2015-11-20 DIAGNOSIS — I10 Essential (primary) hypertension: Secondary | ICD-10-CM

## 2015-11-20 MED ORDER — METOPROLOL TARTRATE 25 MG PO TABS: 12.5000 mg | ORAL_TABLET | Freq: Two times a day (BID) | ORAL | Status: DC

## 2015-11-29 ENCOUNTER — Ambulatory Visit (INDEPENDENT_AMBULATORY_CARE_PROVIDER_SITE_OTHER): Payer: 59 | Admitting: Family Medicine

## 2015-11-29 VITALS — BP 128/72 | HR 89 | Temp 98.3°F | Ht 60.0 in | Wt 156.8 lb

## 2015-11-29 DIAGNOSIS — R52 Pain, unspecified: Secondary | ICD-10-CM

## 2015-11-29 DIAGNOSIS — J209 Acute bronchitis, unspecified: Secondary | ICD-10-CM

## 2015-11-29 LAB — POCT INFLUENZA A/B
Influenza A, POC: NEGATIVE
Influenza B, POC: POSITIVE — AB

## 2015-11-29 MED ORDER — ALBUTEROL SULFATE HFA 108 (90 BASE) MCG/ACT IN AERS: 2.0000 | INHALATION_SPRAY | Freq: Four times a day (QID) | RESPIRATORY_TRACT | Status: DC | PRN

## 2015-11-29 MED ORDER — AZITHROMYCIN 250 MG PO TABS: ORAL_TABLET | ORAL | Status: DC

## 2015-11-29 NOTE — Patient Instructions (Signed)
Great to meet you!  Take a daily yogurt or pro-biotic while on antibiotics  Try the inhaler every 4-6 hours for cough  Acute Bronchitis Bronchitis is when the airways that extend from the windpipe into the lungs get red, puffy, and painful (inflamed). Bronchitis often causes thick spit (mucus) to develop. This leads to a cough. A cough is the most common symptom of bronchitis. In acute bronchitis, the condition usually begins suddenly and goes away over time (usually in 2 weeks). Smoking, allergies, and asthma can make bronchitis worse. Repeated episodes of bronchitis may cause more lung problems. HOME CARE  Rest.  Drink enough fluids to keep your pee (urine) clear or pale yellow (unless you need to limit fluids as told by your doctor).  Only take over-the-counter or prescription medicines as told by your doctor.  Avoid smoking and secondhand smoke. These can make bronchitis worse. If you are a smoker, think about using nicotine gum or skin patches. Quitting smoking will help your lungs heal faster.  Reduce the chance of getting bronchitis again by:  Washing your hands often.  Avoiding people with cold symptoms.  Trying not to touch your hands to your mouth, nose, or eyes.  Follow up with your doctor as told. GET HELP IF: Your symptoms do not improve after 1 week of treatment. Symptoms include:  Cough.  Fever.  Coughing up thick spit.  Body aches.  Chest congestion.  Chills.  Shortness of breath.  Sore throat. GET HELP RIGHT AWAY IF:   You have an increased fever.  You have chills.  You have severe shortness of breath.  You have bloody thick spit (sputum).  You throw up (vomit) often.  You lose too much body fluid (dehydration).  You have a severe headache.  You faint. MAKE SURE YOU:   Understand these instructions.  Will watch your condition.  Will get help right away if you are not doing well or get worse.   This information is not intended to  replace advice given to you by your health care provider. Make sure you discuss any questions you have with your health care provider.   Document Released: 03/15/2008 Document Revised: 05/30/2013 Document Reviewed: 03/20/2013 Elsevier Interactive Patient Education [employer].

## 2015-11-29 NOTE — Progress Notes (Signed)
   HPI  Patient presents today here to discuss acute illness.  Patient signed that she's had about 6 days of headache, cough, congestion, sore [place]issen her chest when she coughs, and body aches.  She's been using Mucinex and drinking plenty of fluids routinely without any improvement for several days.  She is not struggling to breathe but does have productive cough with thick white sputum.  She's tolerating foods and fluids easily.   PMH: Smoking status noted ROS: Per HPI  Objective: BP 128/72 mmHg  Pulse 89  Temp(Src) 98.3 F (36.8 C) (Oral)  Ht 5' (1.524 m)  Wt 156 lb 12.8 oz (71.124 kg)  BMI 30.62 kg/m2 Gen: NAD, alert, cooperative with exam HEENT: NCAT, TM with scarring consistent with previous fracture, left TM normal, nares clear, oropharynx clear Neck: No tender lymphadenopathy CV: RRR, good S1/S2, no murmur Resp: Nonlabored, scattered coarse breath sounds Ext: No edema, warm Neuro: Alert and oriented, No gross deficits  Assessment and plan:  # Acute bronchitis Considering duration of symptoms and coarse breath sounds in lungs with azithromycin Trial of albuterol Return to clinic with any concerns or worsening symptoms, or failure to improve.    Orders Placed This Encounter  Procedures  . POCT Influenza A/B    Meds ordered this encounter  Medications  . azithromycin (ZITHROMAX) 250 MG tablet    Sig: Take 2 tablets on day 1 and 1 tablet daily after that    Dispense:  6 tablet    Refill:  0  . albuterol (PROVENTIL HFA;VENTOLIN HFA) 108 (90 Base) MCG/ACT inhaler    Sig: Inhale 2 puffs into the lungs every 6 (six) hours as needed for wheezing or shortness of breath.    Dispense:  1 Inhaler    Refill:  0    Laroy Apple, MD [facility] 11/29/2015, 9:39 AM

## 2016-01-10 ENCOUNTER — Telehealth: Payer: Self-pay | Admitting: Family Medicine

## 2016-01-10 NOTE — Telephone Encounter (Signed)
Patient using suppositories and doing sitz baths. She will follow up with [place] if no improvement.

## 2016-01-12 ENCOUNTER — Telehealth: Payer: Self-pay | Admitting: Gastroenterology

## 2016-01-12 MED ORDER — HYDROCORTISONE ACETATE 25 MG RE SUPP: 25.0000 mg | Freq: Every day | RECTAL | Status: DC

## 2016-01-12 MED ORDER — PRAMOXINE-HC 1-1 % EX CREA: TOPICAL_CREAM | Freq: Three times a day (TID) | CUTANEOUS | Status: DC

## 2016-01-12 NOTE — Telephone Encounter (Signed)
Dr. Fuller Plan,   Is it ok to send in anusol suppositories for this patient?

## 2016-01-12 NOTE — Telephone Encounter (Signed)
Yes and can use OTC HC cream externally if needed. If symptoms do not resolve in 1-2 weeks she needs to be seen.

## 2016-01-12 NOTE — Telephone Encounter (Signed)
Patient notified  RX sent She will call back if no improvement.  Patient also advised to Soak in tub of warm water/sitz bath 10 minutes BID, [place] pads, meticulous cleaning between bowel movements.

## 2016-02-23 ENCOUNTER — Ambulatory Visit: Payer: Self-pay | Admitting: Gastroenterology

## 2016-03-09 ENCOUNTER — Encounter: Payer: Self-pay | Admitting: Family Medicine

## 2016-03-12 ENCOUNTER — Encounter: Payer: Self-pay | Admitting: Family Medicine

## 2016-03-12 ENCOUNTER — Ambulatory Visit (INDEPENDENT_AMBULATORY_CARE_PROVIDER_SITE_OTHER): Payer: 59 | Admitting: Family Medicine

## 2016-03-12 VITALS — BP 130/80 | HR 65 | Temp 96.9°F | Ht 61.52 in | Wt 157.2 lb

## 2016-03-12 DIAGNOSIS — I10 Essential (primary) hypertension: Secondary | ICD-10-CM

## 2016-03-12 DIAGNOSIS — Z0000 Encounter for general adult medical examination without abnormal findings: Secondary | ICD-10-CM

## 2016-03-12 DIAGNOSIS — E785 Hyperlipidemia, unspecified: Secondary | ICD-10-CM

## 2016-03-12 LAB — BAYER DCA HB A1C WAIVED: HB A1C (BAYER DCA - WAIVED): 5.7 % (ref <7.0)

## 2016-03-12 MED ORDER — PITAVASTATIN CALCIUM 2 MG PO TABS: 2.0000 mg | ORAL_TABLET | Freq: Every day | ORAL | Status: DC

## 2016-03-12 MED ORDER — METOPROLOL SUCCINATE ER 25 MG PO TB24: 25.0000 mg | ORAL_TABLET | Freq: Every day | ORAL | Status: DC

## 2016-03-12 NOTE — Patient Instructions (Addendum)
Great to see you!  I have started livalo for cholesterol, I would like you to get your labs checked again in 3 months  I have changed you metoprolol to succinate

## 2016-03-12 NOTE — Progress Notes (Signed)
   HPI  Patient presents today here to follow-up for hypertension, hyperlipidemia, and labs.  Hypertension Only taking metoprolol tartrate once daily, taking one half pill of that. Average blood pressure at home is 140s, at the stores 140s No chest pain, dyspnea and palpitations, leg edema. She works as a Tourist information centre manager for [employer], she states that she would like to change to metoprolol succinate.  Family history of diabetes is strong  Hyperlipidemia Previously told to try statin, had aches and pains of pravastatin, however she's not convinced this was due to the statin. She's willing to try again.  Blackheads/acne She has a few blackheads on her left face which she would like me to remove today    PMH: Smoking status noted ROS: Per HPI  Objective: BP 130/80 mmHg  Pulse 65  Temp(Src) 96.9 F (36.1 C) (Oral)  Ht 5' 1.52" (1.563 m)  Wt 157 lb 3.2 oz (71.305 kg)  BMI 29.19 kg/m2 Gen: NAD, alert, cooperative with exam HEENT: NCAT, pharynx, nares clear, TMs normal bilaterally CV: RRR, good S1/S2, no murmur Resp: CTABL, no wheezes, non-labored Abd: SNTND, BS present, no guarding or organomegaly Ext: No edema, warm Neuro: Alert and oriented, No gross deficits  Skin 3 blackheads on the left nasolabial fold  Assessment and plan:  # Hypertension Controlled, change metoprolol to succinate for 24-hour coverage   # Hyperlipidemia Based on last lipid panel, 6 month ago, she had5.6- 12.4 % 10 year ASCVD risk She's unclear she's tolerated pravastatin previously Considering that she is strong family history of diabetes I would like glucose neutral statin, pitavistatin started and given two-week samples today Follow-up for labs in 3 months  # Healthcare maintenance Hepatitis C drawn    Orders Placed This Encounter  Procedures  . CMP14+EGFR  . Bayer DCA Hb A1c Waived  . CBC with Differential  . Hepatitis C antibody  . TSH    Meds ordered this encounter    Medications  . esomeprazole (NEXIUM) 20 MG capsule    Sig: Take 20 mg by mouth daily at 12 noon.  . metoprolol succinate (TOPROL-XL) 25 MG 24 hr tablet    Sig: Take 1 tablet (25 mg total) by mouth daily.    Dispense:  90 tablet    Refill:  3  . Pitavastatin Calcium (LIVALO) 2 MG TABS    Sig: Take 1 tablet (2 mg total) by mouth daily.    Dispense:  30 tablet    Refill:  [place], MD [facility] 03/12/2016, 11:37 AM

## 2016-03-13 LAB — CBC WITH DIFFERENTIAL/PLATELET
Basophils Absolute: 0 10*3/uL (ref 0.0–0.2)
Basos: 0 %
EOS (ABSOLUTE): 0.1 10*3/uL (ref 0.0–0.4)
Eos: 1 %
Hematocrit: 42.3 % (ref 34.0–46.6)
Hemoglobin: 14.5 g/dL (ref 11.1–15.9)
Immature Grans (Abs): 0 10*3/uL (ref 0.0–0.1)
Immature Granulocytes: 0 %
Lymphocytes Absolute: 2.4 10*3/uL (ref 0.7–3.1)
Lymphs: 34 %
MCH: 29.2 pg (ref 26.6–33.0)
MCHC: 34.3 g/dL (ref 31.5–35.7)
MCV: 85 fL (ref 79–97)
Monocytes Absolute: 0.5 10*3/uL (ref 0.1–0.9)
Monocytes: 7 %
Neutrophils Absolute: 4.2 10*3/uL (ref 1.4–7.0)
Neutrophils: 58 %
Platelets: 223 10*3/uL (ref 150–379)
RBC: 4.97 x10E6/uL (ref 3.77–5.28)
RDW: 13 % (ref 12.3–15.4)
WBC: 7.2 10*3/uL (ref 3.4–10.8)

## 2016-03-13 LAB — CMP14+EGFR
ALT: 18 IU/L (ref 0–32)
AST: 19 IU/L (ref 0–40)
Albumin/Globulin Ratio: 2 (ref 1.2–2.2)
Albumin: 4.5 g/dL (ref 3.6–4.8)
Alkaline Phosphatase: 57 IU/L (ref 39–117)
BUN/Creatinine Ratio: 17 (ref 12–28)
BUN: 14 mg/dL (ref 8–27)
Bilirubin Total: 0.4 mg/dL (ref 0.0–1.2)
CO2: 22 mmol/L (ref 18–29)
Calcium: 10 mg/dL (ref 8.7–10.3)
Chloride: 103 mmol/L (ref 96–106)
Creatinine, Ser: 0.83 mg/dL (ref 0.57–1.00)
GFR calc Af Amer: 84 mL/min/{1.73_m2} (ref >59)
GFR calc non Af Amer: 73 mL/min/{1.73_m2} (ref >59)
Globulin, Total: 2.2 g/dL (ref 1.5–4.5)
Glucose: 89 mg/dL (ref 65–99)
Potassium: 5 mmol/L (ref 3.5–5.2)
Sodium: 141 mmol/L (ref 134–144)
Total Protein: 6.7 g/dL (ref 6.0–8.5)

## 2016-03-13 LAB — HEPATITIS C ANTIBODY: Hep C Virus Ab: <0.1 s/co ratio (ref 0.0–0.9)

## 2016-03-13 LAB — TSH: TSH: 2.88 u[IU]/mL (ref 0.450–4.500)

## 2016-03-15 ENCOUNTER — Telehealth: Payer: Self-pay | Admitting: *Deleted

## 2016-03-15 NOTE — Telephone Encounter (Signed)
-----   Message from Timmothy Euler, MD sent at 03/15/2016  7:56 AM EDT ----- Her labs are great, will you let her know? Includes A1C, the diabetes test.  Her kidneys and liver look good, her electrolytes are good .  Her blood counts and thyroid is good Her Hep C is negative as expected.  Thanks! sam

## 2016-03-15 NOTE — Telephone Encounter (Signed)
Error

## 2016-03-18 ENCOUNTER — Telehealth: Payer: Self-pay | Admitting: Family Medicine

## 2016-03-18 ENCOUNTER — Telehealth: Payer: Self-pay

## 2016-03-18 DIAGNOSIS — E785 Hyperlipidemia, unspecified: Secondary | ICD-10-CM

## 2016-03-18 DIAGNOSIS — K588 Other irritable bowel syndrome: Secondary | ICD-10-CM

## 2016-03-18 MED ORDER — PITAVASTATIN CALCIUM 2 MG PO TABS: 2.0000 mg | ORAL_TABLET | Freq: Every day | ORAL | Status: DC

## 2016-03-18 MED ORDER — HYOSCYAMINE SULFATE 0.125 MG SL SUBL: SUBLINGUAL_TABLET | SUBLINGUAL | Status: DC

## 2016-03-18 MED ORDER — METOPROLOL SUCCINATE ER 25 MG PO TB24: 25.0000 mg | ORAL_TABLET | Freq: Every day | ORAL | Status: DC

## 2016-03-18 NOTE — Telephone Encounter (Signed)
Spoke with patient please see telephone encounter from today.

## 2016-03-18 NOTE — Telephone Encounter (Signed)
Patient needed her rx's sent [facility]. Rx were sent for patient.

## 2016-03-18 NOTE — Telephone Encounter (Signed)
Question about my medications

## 2016-03-18 NOTE — Telephone Encounter (Signed)
No answer, left VM.   Laroy Apple, MD [facility] 03/18/2016, 12:18 PM

## 2016-03-18 NOTE — Telephone Encounter (Signed)
x

## 2016-03-23 ENCOUNTER — Telehealth: Payer: Self-pay

## 2016-03-23 NOTE — Telephone Encounter (Signed)
Insurance prior authorized Livalo through 03/18/17

## 2016-03-24 ENCOUNTER — Telehealth: Payer: Self-pay | Admitting: Family Medicine

## 2016-03-24 NOTE — Telephone Encounter (Signed)
Spoke with pt regarding Livalo RX Informed pt that refill for RX was sent to [facility] This is what pt requested Pt verbalizes understanding

## 2016-06-19 ENCOUNTER — Ambulatory Visit (INDEPENDENT_AMBULATORY_CARE_PROVIDER_SITE_OTHER): Payer: 59 | Admitting: Nurse Practitioner

## 2016-06-19 ENCOUNTER — Encounter: Payer: Self-pay | Admitting: Nurse Practitioner

## 2016-06-19 VITALS — BP 143/86 | HR 80 | Temp 99.2°F | Ht 61.0 in | Wt 157.0 lb

## 2016-06-19 DIAGNOSIS — S80922A Unspecified superficial injury of left lower leg, initial encounter: Secondary | ICD-10-CM

## 2016-06-19 DIAGNOSIS — Z23 Encounter for immunization: Secondary | ICD-10-CM | POA: Diagnosis not present

## 2016-06-19 DIAGNOSIS — T148XXA Other injury of unspecified body region, initial encounter: Secondary | ICD-10-CM

## 2016-06-19 DIAGNOSIS — R238 Other skin changes: Secondary | ICD-10-CM | POA: Diagnosis not present

## 2016-06-19 NOTE — Patient Instructions (Signed)
Dressing Change °A dressing is a material placed over wounds. It keeps the wound clean, dry, and protected from further injury. This provides an environment that favors wound healing.  °BEFORE YOU BEGIN °· Get your supplies together. Things you may need include: °¨ Saline solution. °¨ Flexible gauze dressing. °¨ Medicated cream. °¨ Tape. °¨ Gloves. °¨ Abdominal dressing pads. °¨ Gauze squares. °¨ Plastic bags. °· Take pain medicine 30 minutes before the dressing change if you need it. °· Take a shower before you do the first dressing change of the day. Use plastic wrap or a plastic bag to prevent the dressing from getting wet. °REMOVING YOUR OLD DRESSING  °· Wash your hands with soap and water. Dry your hands with a clean towel. °· Put on your gloves. °· Remove any tape. °· Carefully remove the old dressing. If the dressing sticks, you may dampen it with warm water to loosen it, or follow your caregiver's specific directions. °· Remove any gauze or packing tape that is in your wound. °· Take off your gloves. °· Put the gloves, tape, gauze, or any packing tape into a plastic bag. °CHANGING YOUR DRESSING °· Open the supplies. °· Take the cap off the saline solution. °· Open the gauze package so that the gauze remains on the inside of the package. °· Put on your gloves. °· Clean your wound as told by your caregiver. °· If you have been told to keep your wound dry, follow those instructions. °· Your caregiver may tell you to do one or more of the following: °¨ Pick up the gauze. Pour the saline solution over the gauze. Squeeze out the extra saline solution. °¨ Put medicated cream or other medicine on your wound if you have been told to do so. °¨ Put the solution soaked gauze only in your wound, not on the skin around it. °¨ Pack your wound loosely or as told by your caregiver. °¨ Put dry gauze on your wound. °¨ Put abdominal dressing pads over the dry gauze if your wet gauze soaks through. °· Tape the abdominal dressing  pads in place so they will not fall off. Do not wrap the tape completely around the affected part (arm, leg, abdomen). °· Wrap the dressing pads with a flexible gauze dressing to secure it in place. °· Take off your gloves. Put them in the plastic bag with the old dressing. Tie the bag shut and throw it away. °· Keep the dressing clean and dry until your next dressing change. °· Wash your hands. °SEEK MEDICAL CARE IF: °· Your skin around the wound looks red. °· Your wound feels more tender or sore. °· You see pus in the wound. °· Your wound smells bad. °· You have a fever. °· Your skin around the wound has a rash that itches and burns. °· You see black or yellow skin in your wound that was not there before. °· You feel nauseous, throw up, and feel very tired. °  °This information is not intended to replace advice given to you by your health care provider. Make sure you discuss any questions you have with your health care provider. °  °Document Released: 11/04/2004 Document Revised: 12/20/2011 Document Reviewed: 08/09/2011 °Elsevier Interactive Patient Education ©2016 Elsevier Inc. ° °

## 2016-06-19 NOTE — Addendum Note (Signed)
Addended by: Chevis Pretty on: 06/19/2016 11:32 AM   Modules accepted: Orders

## 2016-06-19 NOTE — Progress Notes (Signed)
   Subjective:    Patient ID: [name], female    DOB: 1945-12-07, 70 y.o.   MRN: [id number]  HPI Patient in today saying that she fell and scraped her left shin on a nail sticking out of floor at [facility] try good store. She just wanted it cleaned and to see if needed tetanus shot.    Review of Systems  Constitutional: Negative.   HENT: Negative.   Respiratory: Negative.   Cardiovascular: Negative.   Musculoskeletal: Negative.   Skin: Positive for wound (left shin).  Neurological: Negative.   Psychiatric/Behavioral: Negative.   All other systems reviewed and are negative.      Objective:   Physical Exam  Constitutional: She appears well-developed and well-nourished. No distress.  Cardiovascular: Normal rate, regular rhythm and normal heart sounds.   Pulmonary/Chest: Effort normal and breath sounds normal.  Skin: Skin is warm and dry.  Superficial laceration 5-6cm mid anterior shim   Procedure- wound cleaned with NACL- triple antibiotic ointment and dressing     Assessment & Plan:   1. Wound of skin    Watch for signs of infection Keep clean and dry Td given today RTO prn  Mary-Margaret Hassell Done, FNP

## 2016-06-21 NOTE — Addendum Note (Signed)
Addended by: Thana Ates on: 06/21/2016 08:07 AM   Modules accepted: Orders

## 2016-07-08 ENCOUNTER — Ambulatory Visit (INDEPENDENT_AMBULATORY_CARE_PROVIDER_SITE_OTHER): Payer: 59 | Admitting: Family Medicine

## 2016-07-08 ENCOUNTER — Encounter: Payer: Self-pay | Admitting: Family Medicine

## 2016-07-08 VITALS — BP 145/69 | HR 75 | Temp 98.6°F | Ht 61.0 in | Wt 155.0 lb

## 2016-07-08 DIAGNOSIS — L089 Local infection of the skin and subcutaneous tissue, unspecified: Secondary | ICD-10-CM

## 2016-07-08 DIAGNOSIS — T148XXA Other injury of unspecified body region, initial encounter: Principal | ICD-10-CM

## 2016-07-08 DIAGNOSIS — S80922D Unspecified superficial injury of left lower leg, subsequent encounter: Secondary | ICD-10-CM

## 2016-07-08 MED ORDER — CEPHALEXIN 500 MG PO CAPS: 500.0000 mg | ORAL_CAPSULE | Freq: Four times a day (QID) | ORAL | 0 refills | Status: DC

## 2016-07-08 MED ORDER — MUPIROCIN 2 % EX OINT: 1.0000 "application " | TOPICAL_OINTMENT | Freq: Two times a day (BID) | CUTANEOUS | 0 refills | Status: DC

## 2016-07-08 NOTE — Patient Instructions (Signed)
Great to see you!  Come back in 4-6 weeks to re-check cholesterol and your liver labs ( be sure to take the cholesterol medicine during this time)  Take all of the antibiotics You are doing good keeping it clean, continue as you are but try the ointment I prescribed

## 2016-07-08 NOTE — Progress Notes (Addendum)
   HPI  Patient presents today here with concern for wound infection.  Patient explains that she was injured 3 weeks ago by a nail when she fell.  She had a large laceration on th eleft lower leg which has healed steadily since that time. She took antibiotics from an old surgery which she felt helped that healing, since she stopped that healing seem to stop and she developed swelling and redness around the wound.  She denies any drainage. She's been covering it with Jonni Sanger ointment and clean bandage twice daily.  She denies fever, chills, sweats.  She has had a right-sided temporal throbbing type headache over the last 12 hours or so. She states that she would like to take Tylenol to help this. She has not taken any yet. She has had mild photosensitivity, when she was younger she had migraine headaches.  PMH: Smoking status noted ROS: Per HPI  Objective: BP (!) 145/69   Pulse 75   Temp 98.6 F (37 C) (Oral)   Ht 5\' 1"  (1.549 m)   Wt 155 lb (70.3 kg)   BMI 29.29 kg/m  Gen: NAD, alert, cooperative with exam HEENT: NCAT, EOMI, PERRL CV: RRR, good S1/S2, no murmur Resp: CTABL, no wheezes, non-labored Ext: No edema, warm Neuro: Alert and oriented, No gross deficits  Skin Approximate 5 cm long healing lesion on left lower extremity on the shin Centrally located at the distal and she has a 3 mm x 5 mm erythematous area with granulation tissue Surrounding that there is a 14 mm x 9 mm area of induration and swelling  Assessment and plan:  # Posttraumatic wound infection Patient with what appears to be very mild wound infection Treating with Keflex and mupirocin ointment Supportive care and routine wound care reviewed Return to clinic with any concerns  # Migraine headache Headache is most consistent with a migraine Offered abortive therapies, patient will try Tylenol and declines other therapies. Follow-up as needed.   Meds ordered this encounter  Medications  .  cephALEXin (KEFLEX) 500 MG capsule    Sig: Take 1 capsule (500 mg total) by mouth 4 (four) times daily.    Dispense:  28 capsule    Refill:  0  . mupirocin ointment (BACTROBAN) 2 %    Sig: Place 1 application into the nose 2 (two) times daily.    Dispense:  22 g    Refill:  [place], MD [facility] 07/08/2016, 6:35 PM

## 2016-10-07 ENCOUNTER — Encounter: Payer: Self-pay | Admitting: Family Medicine

## 2016-10-07 ENCOUNTER — Ambulatory Visit (INDEPENDENT_AMBULATORY_CARE_PROVIDER_SITE_OTHER): Payer: 59 | Admitting: Family Medicine

## 2016-10-07 VITALS — BP 118/69 | HR 80 | Temp 98.3°F | Ht 61.0 in | Wt 156.0 lb

## 2016-10-07 DIAGNOSIS — I10 Essential (primary) hypertension: Secondary | ICD-10-CM | POA: Diagnosis not present

## 2016-10-07 DIAGNOSIS — E782 Mixed hyperlipidemia: Secondary | ICD-10-CM

## 2016-10-07 DIAGNOSIS — M1812 Unilateral primary osteoarthritis of first carpometacarpal joint, left hand: Secondary | ICD-10-CM | POA: Diagnosis not present

## 2016-10-07 DIAGNOSIS — M7701 Medial epicondylitis, right elbow: Secondary | ICD-10-CM

## 2016-10-07 DIAGNOSIS — M1811 Unilateral primary osteoarthritis of first carpometacarpal joint, right hand: Secondary | ICD-10-CM | POA: Diagnosis not present

## 2016-10-07 MED ORDER — PREDNISONE 20 MG PO TABS: ORAL_TABLET | ORAL | 0 refills | Status: DC

## 2016-10-07 NOTE — Progress Notes (Signed)
BP 118/69   Pulse 80   Temp 98.3 F (36.8 C) (Oral)   Ht 5' 1"  (1.549 m)   Wt 156 lb (70.8 kg)   BMI 29.48 kg/m    Subjective:    Patient ID: [name], female    DOB: 1945/12/30, 70 y.o.   MRN: [id number]  HPI: [name] is a 70 y.o. female presenting on 10/07/2016 for Hyperlipidemia (followup; patient is fasting) and Left elbow pain, bilateral thumb pain (would like arthritis panel added to lab work)   HPI Hypertension recheck Patient is coming in today for hypertension recheck. Her blood pressure today is 118/69. She is currently diet controlled hypertension and is not on any medications for it. Patient denies headaches, blurred vision, chest pains, shortness of breath, or weakness. Denies any side effects from medication and is content with current medication.   Hyperlipidemia recheck Patient is coming in for cholesterol recheck today. She is currently on Livalo 20 mg. She denies any issues such as myalgias with the medication. She has been doing relatively well on her current medications.  Bilateral thumb pain and right elbow pain Patient has been having bilateral thumb pain is been going on for the past few months and has been worsening over the past few weeks. The pain is at the base of both of her thumbs and worse on her right hand than her left. 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 Her right inner elbow has been hurting over the past month and has been increasingly tender during that time. She denies any numbness or weakness in either her hands thumbs or arms. She has full range of motion and strength but the pain is getting to be where it's a 6 out of 10 now and she would like to know what's going on. She is also concerned that she may have some kind of rheumatic disease and wants to be tested for those.  Relevant past medical, surgical, family and social history reviewed and  updated as indicated. Interim medical history since our last visit reviewed. Allergies and medications reviewed and updated.  Review of Systems  Constitutional: Negative for chills and fever.  HENT: Negative for congestion, ear discharge and ear pain.   Eyes: Negative for redness and visual disturbance.  Respiratory: Negative for chest tightness and shortness of breath.   Cardiovascular: Negative for chest pain and leg swelling.  Genitourinary: Negative for difficulty urinating and dysuria.  Musculoskeletal: Positive for arthralgias and myalgias. Negative for back pain, gait problem and joint swelling.  Skin: Negative for color change and rash.  Neurological: Negative for light-headedness and headaches.  Psychiatric/Behavioral: Negative for agitation and behavioral problems.  All other systems reviewed and are negative.   Per HPI unless specifically indicated above   Allergies as of 10/07/2016      Reactions   Benadryl [diphenhydramine Hcl]    Nsaids Hives   Teriparatide Hives      Medication List       Accurate as of 10/07/16  8:41 AM. Always use your most recent med list.          docusate sodium 100 MG capsule Commonly known as:  COLACE Take 100 mg by mouth daily as needed.   esomeprazole 20 MG capsule Commonly known as:  NEXIUM Take 20 mg by mouth daily at 12 noon.   hyoscyamine 0.375 MG 12 hr tablet Commonly known as:  LEVBID Take 1 tablet (0.375 mg total) by mouth 2 (two) times daily as needed.   hyoscyamine 0.125 MG SL tablet Commonly known as:  LEVSIN SL DISSOLVE ONE TABLET UNDER TONGUE EVERY EIGHT HOURS   LIVALO 2 MG Tabs Generic drug:  Pitavastatin Calcium Take 2 mg by mouth daily.   mometasone 50 MCG/ACT nasal spray Commonly known as:  NASONEX 2 sprays into each nostril once daily   mupirocin ointment 2 % Commonly known as:  BACTROBAN Place 1 application into the nose 2 (two) times daily.   nitroGLYCERIN 0.4 MG SL tablet Commonly known as:   NITROSTAT Place 1 tablet (0.4 mg total) under the tongue every 5 (five) minutes as needed for chest pain.   predniSONE 20 MG tablet Commonly known as:  DELTASONE 2 po at same time daily for 5 days   SALINE NASAL SPRAY NA Place into the nose as directed.   TYLENOL 500 MG tablet Generic drug:  acetaminophen Take 500 mg by mouth as needed.          Objective:    BP 118/69   Pulse 80   Temp 98.3 F (36.8 C) (Oral)   Ht 5' 1"  (1.549 m)   Wt 156 lb (70.8 kg)   BMI 29.48 kg/m   Wt Readings from Last 3 Encounters:  10/07/16 156 lb (70.8 kg)  07/08/16 155 lb (70.3 kg)  06/19/16 157 lb (71.2 kg)    Physical Exam  Constitutional: She is oriented to person, place, and time. She appears well-developed and well-nourished. No distress.  Eyes: Conjunctivae are normal.  Cardiovascular: Normal rate, regular rhythm, normal heart sounds and intact distal pulses.   No murmur heard. Pulmonary/Chest: Effort normal and breath sounds normal. No respiratory distress. She has no wheezes.  Musculoskeletal: Normal range of motion. She exhibits no edema.       Right elbow: She exhibits normal range of motion, no swelling and no effusion. Tenderness found. Medial epicondyle tenderness noted.       Hands: Neurological: She is alert and oriented to person, place, and time. Coordination normal.  Skin: Skin is warm and dry. No rash noted. She is not diaphoretic.  Psychiatric: She has a normal mood and affect. Her behavior is normal.  Nursing note and vitals reviewed.     Assessment & Plan:   Problem List Items Addressed This Visit      Cardiovascular and Mediastinum   Hypertension - Primary   Relevant Medications   Pitavastatin Calcium (LIVALO) 2 MG TABS   Other Relevant Orders   CMP14+EGFR (Completed)     Other   HLD (hyperlipidemia)   Relevant Medications   Pitavastatin Calcium (LIVALO) 2 MG TABS   Other Relevant Orders   Lipid panel (Completed)    Other Visit Diagnoses    Arthritis  of carpometacarpal (CMC) joint of left thumb       Relevant Medications   predniSONE (DELTASONE) 20 MG tablet   Other Relevant Orders   Arthritis Panel (Completed)   Sedimentation rate   Arthritis of carpometacarpal (CMC) joint of right thumb       Relevant Medications   predniSONE (DELTASONE) 20 MG tablet   Other Relevant Orders   Arthritis Panel (Completed)   Sedimentation rate   Medial epicondylitis of elbow, right       Relevant Medications   predniSONE (DELTASONE) 20 MG tablet   Other Relevant Orders   Arthritis Panel (Completed)   Sedimentation rate  Follow up plan: Return in about 6 months (around 04/07/2017), or if symptoms worsen or fail to improve, for Recheck hypertension and cholesterol.  Counseling provided for all of the vaccine components Orders Placed This Encounter  Procedures  . Lipid panel  . CMP14+EGFR  . Arthritis Panel  . Sedimentation rate    Caryl Pina, MD [facility] 10/07/2016, 8:41 AM

## 2016-10-08 LAB — ARTHRITIS PANEL
Basophils Absolute: 0 10*3/uL (ref 0.0–0.2)
Basos: 1 %
EOS (ABSOLUTE): 0.1 10*3/uL (ref 0.0–0.4)
Eos: 2 %
Hematocrit: 43.3 % (ref 34.0–46.6)
Hemoglobin: 14.2 g/dL (ref 11.1–15.9)
Immature Grans (Abs): 0 10*3/uL (ref 0.0–0.1)
Immature Granulocytes: 0 %
Lymphocytes Absolute: 2.1 10*3/uL (ref 0.7–3.1)
Lymphs: 36 %
MCH: 28 pg (ref 26.6–33.0)
MCHC: 32.8 g/dL (ref 31.5–35.7)
MCV: 85 fL (ref 79–97)
Monocytes Absolute: 0.5 10*3/uL (ref 0.1–0.9)
Monocytes: 8 %
Neutrophils Absolute: 3 10*3/uL (ref 1.4–7.0)
Neutrophils: 53 %
Platelets: 202 10*3/uL (ref 150–379)
RBC: 5.07 x10E6/uL (ref 3.77–5.28)
RDW: 13.7 % (ref 12.3–15.4)
Rhuematoid fact SerPl-aCnc: <10 IU/mL (ref 0.0–13.9)
Sed Rate: 2 mm/hr (ref 0–40)
Uric Acid: 6.1 mg/dL (ref 2.5–7.1)
WBC: 5.6 10*3/uL (ref 3.4–10.8)

## 2016-10-08 LAB — CMP14+EGFR
ALT: 22 IU/L (ref 0–32)
AST: 22 IU/L (ref 0–40)
Albumin/Globulin Ratio: 2 (ref 1.2–2.2)
Albumin: 4.4 g/dL (ref 3.6–4.8)
Alkaline Phosphatase: 60 IU/L (ref 39–117)
BUN/Creatinine Ratio: 13 (ref 12–28)
BUN: 12 mg/dL (ref 8–27)
Bilirubin Total: 0.5 mg/dL (ref 0.0–1.2)
CO2: 25 mmol/L (ref 18–29)
Calcium: 9.7 mg/dL (ref 8.7–10.3)
Chloride: 101 mmol/L (ref 96–106)
Creatinine, Ser: 0.9 mg/dL (ref 0.57–1.00)
GFR calc Af Amer: 75 mL/min/{1.73_m2} (ref >59)
GFR calc non Af Amer: 65 mL/min/{1.73_m2} (ref >59)
Globulin, Total: 2.2 g/dL (ref 1.5–4.5)
Glucose: 88 mg/dL (ref 65–99)
Potassium: 4.4 mmol/L (ref 3.5–5.2)
Sodium: 141 mmol/L (ref 134–144)
Total Protein: 6.6 g/dL (ref 6.0–8.5)

## 2016-10-08 LAB — LIPID PANEL
Chol/HDL Ratio: 2.4 ratio units (ref 0.0–4.4)
Cholesterol, Total: 163 mg/dL (ref 100–199)
HDL: 67 mg/dL (ref >39)
LDL Calculated: 77 mg/dL (ref 0–99)
Triglycerides: 93 mg/dL (ref 0–149)
VLDL Cholesterol Cal: 19 mg/dL (ref 5–40)

## 2016-10-11 DIAGNOSIS — M810 Age-related osteoporosis without current pathological fracture: Secondary | ICD-10-CM

## 2016-10-11 HISTORY — DX: Age-related osteoporosis without current pathological fracture: M81.0

## 2016-10-25 ENCOUNTER — Ambulatory Visit (INDEPENDENT_AMBULATORY_CARE_PROVIDER_SITE_OTHER): Payer: 59 | Admitting: Family Medicine

## 2016-10-25 ENCOUNTER — Encounter: Payer: Self-pay | Admitting: Family Medicine

## 2016-10-25 VITALS — BP 123/61 | HR 90 | Temp 98.6°F | Ht 61.0 in | Wt 156.6 lb

## 2016-10-25 DIAGNOSIS — F32A Depression, unspecified: Secondary | ICD-10-CM

## 2016-10-25 DIAGNOSIS — M1812 Unilateral primary osteoarthritis of first carpometacarpal joint, left hand: Secondary | ICD-10-CM | POA: Diagnosis not present

## 2016-10-25 DIAGNOSIS — M1811 Unilateral primary osteoarthritis of first carpometacarpal joint, right hand: Secondary | ICD-10-CM | POA: Diagnosis not present

## 2016-10-25 DIAGNOSIS — F329 Major depressive disorder, single episode, unspecified: Secondary | ICD-10-CM

## 2016-10-25 DIAGNOSIS — F418 Other specified anxiety disorders: Secondary | ICD-10-CM

## 2016-10-25 DIAGNOSIS — I10 Essential (primary) hypertension: Secondary | ICD-10-CM

## 2016-10-25 DIAGNOSIS — F419 Anxiety disorder, unspecified: Secondary | ICD-10-CM

## 2016-10-25 MED ORDER — ESCITALOPRAM OXALATE 10 MG PO TABS: 10.0000 mg | ORAL_TABLET | Freq: Every day | ORAL | 1 refills | Status: DC

## 2016-10-25 NOTE — Assessment & Plan Note (Signed)
Diet controlled hypertension

## 2016-10-25 NOTE — Progress Notes (Signed)
BP 123/61   Pulse 90   Temp 98.6 F (37 C) (Oral)   Ht 5\' 1"  (1.549 m)   Wt 156 lb 9.6 oz (71 kg)   BMI 29.59 kg/m    Subjective:    Patient ID: [name], female    DOB: 1945-10-14, 71 y.o.   MRN: [id number]  HPI: [name] is a 71 y.o. female presenting on 10/25/2016 for Hypertension (fu & labwork fu, referral to rheumatologist / depression)   HPI Hypertension recheck Patient is coming in for hypertension recheck. Her blood pressure today is 123/61. She is not currently on any medications for blood pressure. She denies any lightheadedness or dizziness or chest pressure. She denies any shortness of breath or wheezing.  Anxiety and depression Patient has been feeling more down recently, she has had a loss in the family and has been feeling more down because of that. She's also been having some difficulty sleeping at night and with energy. Depression screen [facility] 2/9 10/25/2016 10/07/2016 06/19/2016 03/12/2016 08/22/2015  Decreased Interest 2 0 0 0 0  Down, Depressed, Hopeless 2 0 0 0 0  PHQ - 2 Score 4 0 0 0 0  Altered sleeping 2 - - - -  Tired, decreased energy 2 - - - -  Change in appetite 2 - - - -  Feeling bad or failure about yourself  2 - - - -  Trouble concentrating 2 - - - -  Moving slowly or fidgety/restless 2 - - - -  Suicidal thoughts 0 - - - -  PHQ-9 Score 16 - - - -    Arthritis in joints of the thumbs and elbows During her last visit we did an arthritis panel and did not find anything positive but she has continued to have issues in both her thumbs and her elbows and she also thinks she is having dry mouth and dry eyes and wants to go see a rheumatologist for further workup. We will do the referral for.  Relevant past medical, surgical, family and social history reviewed and updated as indicated. Interim medical history since our last visit reviewed. Allergies and medications reviewed and updated.  Review of Systems  Constitutional: Negative for chills and  fever.  Respiratory: Negative for chest tightness and shortness of breath.   Cardiovascular: Negative for chest pain and leg swelling.  Genitourinary: Negative for difficulty urinating and dysuria.  Musculoskeletal: Positive for arthralgias. Negative for back pain and gait problem.  Skin: Negative for color change and rash.  Neurological: Negative for light-headedness and headaches.  Psychiatric/Behavioral: Positive for dysphoric mood and sleep disturbance. Negative for agitation, behavioral problems, self-injury and suicidal ideas. The patient is nervous/anxious.   All other systems reviewed and are negative.   Per HPI unless specifically indicated above   Allergies as of 10/25/2016      Reactions   Benadryl [diphenhydramine Hcl]    Nsaids Hives   Teriparatide Hives      Medication List       Accurate as of 10/25/16 12:54 PM. Always use your most recent med list.          co-enzyme Q-10 30 MG capsule Take 30 mg by mouth 3 (three) times daily.   docusate sodium 100 MG capsule Commonly known as:  COLACE Take 100 mg by mouth daily as needed.   escitalopram 10 MG tablet Commonly known as:  LEXAPRO Take 1 tablet (10 mg total) by mouth daily.   esomeprazole 20 MG  capsule Commonly known as:  NEXIUM Take 20 mg by mouth daily at 12 noon.   hyoscyamine 0.375 MG 12 hr tablet Commonly known as:  LEVBID Take 1 tablet (0.375 mg total) by mouth 2 (two) times daily as needed.   hyoscyamine 0.125 MG SL tablet Commonly known as:  LEVSIN SL DISSOLVE ONE TABLET UNDER TONGUE EVERY EIGHT HOURS   LIVALO 2 MG Tabs Generic drug:  Pitavastatin Calcium Take 2 mg by mouth daily.   magnesium 30 MG tablet Take 30 mg by mouth 2 (two) times daily.   mometasone 50 MCG/ACT nasal spray Commonly known as:  NASONEX 2 sprays into each nostril once daily   multivitamin capsule Take 1 capsule by mouth daily.   mupirocin ointment 2 % Commonly known as:  BACTROBAN Place 1 application into  the nose 2 (two) times daily.   nitroGLYCERIN 0.4 MG SL tablet Commonly known as:  NITROSTAT Place 1 tablet (0.4 mg total) under the tongue every 5 (five) minutes as needed for chest pain.   SALINE NASAL SPRAY NA Place into the nose as directed.   TYLENOL 500 MG tablet Generic drug:  acetaminophen Take 500 mg by mouth as needed.          Objective:    BP 123/61   Pulse 90   Temp 98.6 F (37 C) (Oral)   Ht 5\' 1"  (1.549 m)   Wt 156 lb 9.6 oz (71 kg)   BMI 29.59 kg/m   Wt Readings from Last 3 Encounters:  10/25/16 156 lb 9.6 oz (71 kg)  10/07/16 156 lb (70.8 kg)  07/08/16 155 lb (70.3 kg)    Physical Exam  Constitutional: She is oriented to person, place, and time. She appears well-developed and well-nourished. No distress.  Eyes: Conjunctivae are normal.  Cardiovascular: Normal rate, regular rhythm, normal heart sounds and intact distal pulses.   No murmur heard. Pulmonary/Chest: Effort normal and breath sounds normal. No respiratory distress. She has no wheezes. She has no rales.  Musculoskeletal: Normal range of motion. She exhibits tenderness (Joint tenderness in both thumbs and elbows). She exhibits no edema.  Neurological: She is alert and oriented to person, place, and time. Coordination normal.  Skin: Skin is warm and dry. No rash noted. She is not diaphoretic.  Psychiatric: Her behavior is normal. Judgment normal. Her mood appears anxious. She exhibits a depressed mood. She expresses no suicidal ideation. She expresses no suicidal plans.  Nursing note and vitals reviewed.     Assessment & Plan:   Problem List Items Addressed This Visit      Cardiovascular and Mediastinum   Hypertension - Primary    Diet controlled hypertension        Other   Anxiety and depression   Relevant Medications   escitalopram (LEXAPRO) 10 MG tablet    Other Visit Diagnoses    Arthritis of carpometacarpal (CMC) joint of left thumb       Relevant Orders   Ambulatory referral  to Rheumatology   Arthritis of carpometacarpal [facility]) joint of right thumb       Relevant Orders   Ambulatory referral to Rheumatology       Follow up plan: Return in about 4 weeks (around 11/22/2016), or if symptoms worsen or fail to improve, for Recheck anxiety and depression.  Counseling provided for all of the vaccine components Orders Placed This Encounter  Procedures  . Ambulatory referral to Rheumatology    Caryl Pina, MD [facility] 10/25/2016,  12:54 PM

## 2016-11-26 ENCOUNTER — Other Ambulatory Visit: Payer: Self-pay

## 2016-11-26 DIAGNOSIS — F419 Anxiety disorder, unspecified: Principal | ICD-10-CM

## 2016-11-26 DIAGNOSIS — F32A Depression, unspecified: Secondary | ICD-10-CM

## 2016-11-26 DIAGNOSIS — F329 Major depressive disorder, single episode, unspecified: Secondary | ICD-10-CM

## 2016-11-26 MED ORDER — ESCITALOPRAM OXALATE 10 MG PO TABS: 10.0000 mg | ORAL_TABLET | Freq: Every day | ORAL | 0 refills | Status: DC

## 2017-01-03 ENCOUNTER — Encounter: Payer: Self-pay | Admitting: Family Medicine

## 2017-01-03 ENCOUNTER — Ambulatory Visit (INDEPENDENT_AMBULATORY_CARE_PROVIDER_SITE_OTHER): Payer: 59 | Admitting: Family Medicine

## 2017-01-03 VITALS — BP 126/80 | HR 70 | Temp 97.4°F | Ht 61.0 in | Wt 156.0 lb

## 2017-01-03 DIAGNOSIS — F32A Depression, unspecified: Secondary | ICD-10-CM

## 2017-01-03 DIAGNOSIS — F418 Other specified anxiety disorders: Secondary | ICD-10-CM

## 2017-01-03 DIAGNOSIS — F329 Major depressive disorder, single episode, unspecified: Secondary | ICD-10-CM

## 2017-01-03 DIAGNOSIS — F419 Anxiety disorder, unspecified: Principal | ICD-10-CM

## 2017-01-03 MED ORDER — ESCITALOPRAM OXALATE 10 MG PO TABS: 10.0000 mg | ORAL_TABLET | Freq: Every day | ORAL | 2 refills | Status: DC

## 2017-01-03 NOTE — Progress Notes (Signed)
BP 126/80   Pulse 70   Temp 97.4 F (36.3 C) (Oral)   Ht 5\' 1"  (1.549 m)   Wt 156 lb (70.8 kg)   BMI 29.48 kg/m    Subjective:    Patient ID: [name], female    DOB: 1946-09-30, 71 y.o.   MRN: [id number]  HPI: [name] is a 71 y.o. female presenting on 01/03/2017 for Anxiety & Depression (4 week followup)   HPI Anxiety depression follow-up Patient is coming in today for recheck on her anxiety and depression. She says she is doing very well on the Lexapro and denies any major issues with it. She feels like things are going very well and she is getting ready to retire and is very happy about that and looks forward to life and is enjoying it. She is also planning a big trip with a bunch of her sisters and girlfriends to go to [place] and is very happy about that Depression screen [facility] 2/9 01/03/2017 10/25/2016 10/07/2016  Decreased Interest 0 2 0  Down, Depressed, Hopeless 0 2 0  PHQ - 2 Score 0 4 0  Altered sleeping - 2 -  Tired, decreased energy - 2 -  Change in appetite - 2 -  Feeling bad or failure about yourself  - 2 -  Trouble concentrating - 2 -  Moving slowly or fidgety/restless - 2 -  Suicidal thoughts - 0 -  PHQ-9 Score - 16 -     Relevant past medical, surgical, family and social history reviewed and updated as indicated. Interim medical history since our last visit reviewed. Allergies and medications reviewed and updated.  Review of Systems  Constitutional: Negative for chills and fever.  Respiratory: Negative for chest tightness and shortness of breath.   Cardiovascular: Negative for chest pain and leg swelling.  Genitourinary: Negative for difficulty urinating and dysuria.  Musculoskeletal: Negative for back pain and gait problem.  Skin: Negative for rash.  Neurological: Negative for light-headedness and headaches.  Psychiatric/Behavioral: Negative for agitation, behavioral problems, dysphoric mood, self-injury, sleep disturbance and suicidal ideas.  The patient is not nervous/anxious.   All other systems reviewed and are negative.   Per HPI unless specifically indicated above        Objective:    BP 126/80   Pulse 70   Temp 97.4 F (36.3 C) (Oral)   Ht 5\' 1"  (1.549 m)   Wt 156 lb (70.8 kg)   BMI 29.48 kg/m   Wt Readings from Last 3 Encounters:  01/03/17 156 lb (70.8 kg)  10/25/16 156 lb 9.6 oz (71 kg)  10/07/16 156 lb (70.8 kg)    Physical Exam  Constitutional: She is oriented to person, place, and time. She appears well-developed and well-nourished. No distress.  Eyes: Conjunctivae are normal.  Cardiovascular: Normal rate, regular rhythm, normal heart sounds and intact distal pulses.   No murmur heard. Pulmonary/Chest: Effort normal and breath sounds normal. No respiratory distress. She has no wheezes. She has no rales.  Musculoskeletal: Normal range of motion. She exhibits no edema or tenderness.  Neurological: She is alert and oriented to person, place, and time. Coordination normal.  Skin: Skin is warm and dry. No rash noted. She is not diaphoretic.  Psychiatric: She has a normal mood and affect. Her behavior is normal. Judgment normal. Her mood appears not anxious. She does not exhibit a depressed mood. She expresses no suicidal ideation. She expresses no suicidal plans.  Nursing note and vitals reviewed.  Assessment & Plan:   Problem List Items Addressed This Visit      Other   Anxiety and depression - Primary   Relevant Medications   escitalopram (LEXAPRO) 10 MG tablet       Follow up plan: Return in about 3 months (around 04/05/2017), or if symptoms worsen or fail to improve, for Recheck anxiety and depression and repeat bone density.  Counseling provided for all of the vaccine components No orders of the defined types were placed in this encounter.   Caryl Pina, MD [facility] 01/03/2017, 10:17 AM

## 2017-02-02 ENCOUNTER — Other Ambulatory Visit: Payer: Self-pay | Admitting: *Deleted

## 2017-02-02 DIAGNOSIS — F32A Depression, unspecified: Secondary | ICD-10-CM

## 2017-02-02 DIAGNOSIS — F419 Anxiety disorder, unspecified: Principal | ICD-10-CM

## 2017-02-02 DIAGNOSIS — F329 Major depressive disorder, single episode, unspecified: Secondary | ICD-10-CM

## 2017-02-02 MED ORDER — ESCITALOPRAM OXALATE 10 MG PO TABS: 10.0000 mg | ORAL_TABLET | Freq: Every day | ORAL | 0 refills | Status: DC

## 2017-02-07 ENCOUNTER — Encounter: Payer: Self-pay | Admitting: Family Medicine

## 2017-02-07 ENCOUNTER — Ambulatory Visit (INDEPENDENT_AMBULATORY_CARE_PROVIDER_SITE_OTHER): Payer: 59 | Admitting: Family Medicine

## 2017-02-07 VITALS — BP 116/81 | HR 77 | Temp 98.7°F | Ht 61.0 in | Wt 151.8 lb

## 2017-02-07 DIAGNOSIS — R1012 Left upper quadrant pain: Secondary | ICD-10-CM

## 2017-02-07 NOTE — Progress Notes (Signed)
   HPI  Patient presents today here with left upper quadrant abdominal pain.  Patient's when she's had symptoms for about one week.  She describes sharp left upper quadrant abdominal pain worse when bending over. Last night she had her husband feel the area which cause severe sharp pain.  She denies any change in bowel habits, fever, chills, sweats, nausea, or vomiting.  Patient does have some intermittent loose stool and nausea intermittently due to IBS, this has not changed.  She does have a GI doctor and history of esophageal ulcer many years ago.  She recently stopped PPIs to try to see if she could get by on H2 blockers, she had increased GERD symptoms and has returned to taking Zegerid 1 pill once daily.  PMH: Smoking status noted ROS: Per HPI  Objective: BP 116/81   Pulse 77   Temp 98.7 F (37.1 C) (Oral)   Ht _0  (1.549 m)   Wt 151 lb 12.8 oz (68.9 kg)   BMI 28.68 kg/m  Gen: NAD, alert, cooperative with exam HEENT: NCAT CV: RRR, good S1/S2, no murmur Resp: CTABL, no wheezes, non-labored Abd: Soft, tenderness to palpation of the left upper quadrant, no abnormalities found, mild tenderness in epigastric area Ext: No edema, warm Neuro: Alert and oriented, No gross deficits  Pt would like me to pend orders for meds, insurance changes tomorrow.   Assessment and plan:  # Left upper Quadrant Abd pain Start BID PPI, Has Hx of esophageal ulcer which is difficult to rule out today Exam very reassuring Labs Follow up with GI RTC with any concerns   Orders Placed This Encounter  Procedures  . CBC with Differential/Platelet  . CMP14+EGFR  . Lipase    Meds ordered this encounter  Medications  . Omeprazole-Sodium Bicarbonate (ZEGERID PO)    Sig: Take by mouth.    Laroy Apple, MD [facility] 02/07/2017, 12:40 PM

## 2017-02-07 NOTE — Patient Instructions (Signed)
Great to see you!  Please call your GI doctor for a  Follow up appointment  Start protonix 1 pill twice daily  Avoid NSAIDs ( ibuprofen and aleve)   Abdominal Pain, Adult Many things can cause belly (abdominal) pain. Most times, belly pain is not dangerous. Many cases of belly pain can be watched and treated at home. Sometimes belly pain is serious, though. Your doctor will try to find the cause of your belly pain. Follow these instructions at home:  Take over-the-counter and prescription medicines only as told by your doctor. Do not take medicines that help you poop (laxatives) unless told to by your doctor.  Drink enough fluid to keep your pee (urine) clear or pale yellow.  Watch your belly pain for any changes.  Keep all follow-up visits as told by your doctor. This is important. Contact a doctor if:  Your belly pain changes or gets worse.  You are not hungry, or you lose weight without trying.  You are having trouble pooping (constipated) or have watery poop (diarrhea) for more than 2-3 days.  You have pain when you pee or poop.  Your belly pain wakes you up at night.  Your pain gets worse with meals, after eating, or with certain foods.  You are throwing up and cannot keep anything down.  You have a fever. Get help right away if:  Your pain does not go away as soon as your doctor says it should.  You cannot stop throwing up.  Your pain is only in areas of your belly, such as the right side or the left lower part of the belly.  You have bloody or black poop, or poop that looks like tar.  You have very bad pain, cramping, or bloating in your belly.  You have signs of not having enough fluid or water in your body (dehydration), such as:  Dark pee, very little pee, or no pee.  Cracked lips.  Dry mouth.  Sunken eyes.  Sleepiness.  Weakness. This information is not intended to replace advice given to you by your health care provider. Make sure you discuss  any questions you have with your health care provider. Document Released: 03/15/2008 Document Revised: 04/16/2016 Document Reviewed: 03/10/2016 Elsevier Interactive Patient Education  2017 [employer].

## 2017-02-08 ENCOUNTER — Telehealth: Payer: Self-pay | Admitting: *Deleted

## 2017-02-08 LAB — CMP14+EGFR
ALT: 16 IU/L (ref 0–32)
AST: 22 IU/L (ref 0–40)
Albumin/Globulin Ratio: 1.7 (ref 1.2–2.2)
Albumin: 4.3 g/dL (ref 3.6–4.8)
Alkaline Phosphatase: 56 IU/L (ref 39–117)
BUN/Creatinine Ratio: 18 (ref 12–28)
BUN: 15 mg/dL (ref 8–27)
Bilirubin Total: 0.2 mg/dL (ref 0.0–1.2)
CO2: 24 mmol/L (ref 18–29)
Calcium: 10.1 mg/dL (ref 8.7–10.3)
Chloride: 101 mmol/L (ref 96–106)
Creatinine, Ser: 0.84 mg/dL (ref 0.57–1.00)
GFR calc Af Amer: 82 mL/min/{1.73_m2} (ref >59)
GFR calc non Af Amer: 71 mL/min/{1.73_m2} (ref >59)
Globulin, Total: 2.5 g/dL (ref 1.5–4.5)
Glucose: 90 mg/dL (ref 65–99)
Potassium: 4.1 mmol/L (ref 3.5–5.2)
Sodium: 141 mmol/L (ref 134–144)
Total Protein: 6.8 g/dL (ref 6.0–8.5)

## 2017-02-08 LAB — CBC WITH DIFFERENTIAL/PLATELET
Basophils Absolute: 0 10*3/uL (ref 0.0–0.2)
Basos: 1 %
EOS (ABSOLUTE): 0.1 10*3/uL (ref 0.0–0.4)
Eos: 2 %
Hematocrit: 41.8 % (ref 34.0–46.6)
Hemoglobin: 14.1 g/dL (ref 11.1–15.9)
Immature Grans (Abs): 0 10*3/uL (ref 0.0–0.1)
Immature Granulocytes: 0 %
Lymphocytes Absolute: 2.1 10*3/uL (ref 0.7–3.1)
Lymphs: 36 %
MCH: 28.2 pg (ref 26.6–33.0)
MCHC: 33.7 g/dL (ref 31.5–35.7)
MCV: 84 fL (ref 79–97)
Monocytes Absolute: 0.5 10*3/uL (ref 0.1–0.9)
Monocytes: 8 %
Neutrophils Absolute: 3.1 10*3/uL (ref 1.4–7.0)
Neutrophils: 53 %
Platelets: 212 10*3/uL (ref 150–379)
RBC: 5 x10E6/uL (ref 3.77–5.28)
RDW: 13.5 % (ref 12.3–15.4)
WBC: 5.9 10*3/uL (ref 3.4–10.8)

## 2017-02-08 LAB — LIPASE: Lipase: 37 U/L (ref 14–72)

## 2017-02-08 MED ORDER — PANTOPRAZOLE SODIUM 40 MG PO TBEC: 40.0000 mg | DELAYED_RELEASE_TABLET | Freq: Two times a day (BID) | ORAL | 1 refills | Status: DC

## 2017-02-08 MED ORDER — PRAVASTATIN SODIUM 20 MG PO TABS: 20.0000 mg | ORAL_TABLET | Freq: Every day | ORAL | 3 refills | Status: DC

## 2017-02-08 NOTE — Telephone Encounter (Signed)
Detailed message left that rx have been sent

## 2017-02-08 NOTE — Telephone Encounter (Signed)
Saw Dr. Wendi Snipes yesterday He was to pend medications till today To be sent to [facility]

## 2017-02-08 NOTE — Telephone Encounter (Signed)
Sent both pantoprazole and pravachol to [facility]

## 2017-02-17 ENCOUNTER — Telehealth: Payer: Self-pay

## 2017-02-17 NOTE — Telephone Encounter (Signed)
I doubt that this is the Protonix that Is causing that but if she feels like it is the issue then she can go ahead and go back on what she was taking and then come back in and follow up with Dr. Wendi Snipes.

## 2017-02-17 NOTE — Telephone Encounter (Signed)
Patient seen [name] 4/30 for LUQ pain. Patient states she has been taking zegerid in the morning and Nexium at night until her mail order came in. Mail order came in yesterday and she started taking the protonix 40mg  last night and this morning. Patient states since started she has been having a "vibration sensation on the left side of her chest, which felt like she had a phone in her shirt pocket". Denies any SOB or chest pain. Would like to know if she should go back to what she was taking and d/c the protonix ? Please advise

## 2017-02-17 NOTE — Telephone Encounter (Signed)
Patient aware and verbalizes understanding. 

## 2017-03-08 ENCOUNTER — Telehealth: Payer: Self-pay

## 2017-03-08 NOTE — Telephone Encounter (Signed)
Livalo 2 mg daily at bedtime, give her 90 day prescription with 2 refills

## 2017-04-01 DIAGNOSIS — Z1231 Encounter for screening mammogram for malignant neoplasm of breast: Secondary | ICD-10-CM | POA: Diagnosis not present

## 2017-04-08 ENCOUNTER — Other Ambulatory Visit: Payer: Self-pay | Admitting: Family Medicine

## 2017-04-08 DIAGNOSIS — F329 Major depressive disorder, single episode, unspecified: Secondary | ICD-10-CM

## 2017-04-08 DIAGNOSIS — F32A Depression, unspecified: Secondary | ICD-10-CM

## 2017-04-08 DIAGNOSIS — F419 Anxiety disorder, unspecified: Principal | ICD-10-CM

## 2017-04-19 DIAGNOSIS — M79672 Pain in left foot: Secondary | ICD-10-CM | POA: Diagnosis not present

## 2017-04-19 DIAGNOSIS — M79671 Pain in right foot: Secondary | ICD-10-CM | POA: Diagnosis not present

## 2017-04-19 DIAGNOSIS — M7742 Metatarsalgia, left foot: Secondary | ICD-10-CM | POA: Diagnosis not present

## 2017-04-19 DIAGNOSIS — M7741 Metatarsalgia, right foot: Secondary | ICD-10-CM | POA: Diagnosis not present

## 2017-05-11 DIAGNOSIS — H40033 Anatomical narrow angle, bilateral: Secondary | ICD-10-CM | POA: Diagnosis not present

## 2017-05-11 DIAGNOSIS — H1013 Acute atopic conjunctivitis, bilateral: Secondary | ICD-10-CM | POA: Diagnosis not present

## 2017-05-16 ENCOUNTER — Encounter: Payer: Self-pay | Admitting: Family Medicine

## 2017-05-16 ENCOUNTER — Ambulatory Visit (INDEPENDENT_AMBULATORY_CARE_PROVIDER_SITE_OTHER): Payer: Medicare Other | Admitting: Family Medicine

## 2017-05-16 VITALS — BP 120/61 | HR 70 | Temp 97.0°F | Ht 61.0 in | Wt 147.0 lb

## 2017-05-16 DIAGNOSIS — M75111 Incomplete rotator cuff tear or rupture of right shoulder, not specified as traumatic: Secondary | ICD-10-CM | POA: Diagnosis not present

## 2017-05-16 NOTE — Progress Notes (Signed)
BP 120/61   Pulse 70   Temp (!) 97 F (36.1 C) (Oral)   Ht 5\' 1"  (1.549 m)   Wt 147 lb (66.7 kg)   BMI 27.78 kg/m    Subjective:    Patient ID: [name], female    DOB: 12/30/1945, 71 y.o.   MRN: [id number]  HPI: [name] is a 71 y.o. female presenting on 05/16/2017 for pain in her right shoulder exacerbated by lifting and moving her arm above her head. She reports that she tore her right rotator cuff 5 years ago but elected not to have surgery, and it has become worse over the past week after sleeping on a firm sofa at her sisters house. She additionally reports a desire to come off of her Lexapro and decrease the dose on her Protonix. She is due for lab work and a Dexa scan. Pain is mild and has been improving with Tylenol.  HPI   Relevant past medical, surgical, family and social history reviewed and updated as indicated. Interim medical history since our last visit reviewed. Allergies and medications reviewed and updated.  Review of Systems  Constitutional: Negative for chills and fever.  HENT: Negative for congestion, rhinorrhea and sore throat.   Respiratory: Negative for cough, shortness of breath and wheezing.   Cardiovascular: Negative for chest pain, palpitations and leg swelling.  Gastrointestinal: Negative for abdominal pain, constipation, diarrhea, nausea and vomiting.  Musculoskeletal: Positive for arthralgias (right shoulder, DIP, PIP, MCP) and joint swelling. Negative for myalgias.  Neurological: Negative for dizziness, weakness and numbness.  Psychiatric/Behavioral: Negative.  Negative for agitation, behavioral problems and confusion.    Per HPI unless specifically indicated above     Objective:    BP 120/61   Pulse 70   Temp (!) 97 F (36.1 C) (Oral)   Ht 5\' 1"  (1.549 m)   Wt 147 lb (66.7 kg)   BMI 27.78 kg/m   Wt Readings from Last 3 Encounters:  05/16/17 147 lb (66.7 kg)  02/07/17 151 lb 12.8 oz (68.9 kg)  01/03/17 156 lb (70.8 kg)      Physical Exam  Constitutional: She is oriented to person, place, and time. She appears well-developed and well-nourished. No distress.  HENT:  Head: Normocephalic and atraumatic.  Mouth/Throat: Oropharynx is clear and moist. No oropharyngeal exudate.  Eyes: Pupils are equal, round, and reactive to light. Conjunctivae and EOM are normal. No scleral icterus.  Neck: Normal range of motion. Neck supple. No thyromegaly present.  Cardiovascular: Normal rate, regular rhythm, normal heart sounds and intact distal pulses.  Exam reveals no gallop and no friction rub.   No murmur heard. Pulmonary/Chest: Effort normal and breath sounds normal. No respiratory distress. She has no wheezes. She has no rales.  Abdominal: Soft. Bowel sounds are normal. There is no tenderness.  Musculoskeletal: Normal range of motion. She exhibits tenderness (right shoulder). She exhibits no edema or deformity.  + Hawkins impingement right side + empty can test right side + Pain with internal rotation  Neurological: She is alert and oriented to person, place, and time. She has normal reflexes.  Skin: Skin is warm and dry. She is not diaphoretic.  Psychiatric: She has a normal mood and affect. Her behavior is normal. Judgment and thought content normal.        Assessment & Plan:   Problem List Items Addressed This Visit    None    Visit Diagnoses    Incomplete tear of right rotator  cuff    -  Primary   Relevant Orders   Ambulatory referral to Physical Therapy      BALJIT LIEBERT is a 71 y.o. female presenting on 05/16/2017 for pain in her right shoulder exacerbated by lifting and moving her arm above her head. She reports that she tore her right rotator cuff 5 years ago but elected not to have surgery, and it has become worse over the past week after sleeping on a firm sofa at her sisters house. She has positive Hawkins impingement and empty can tests on exam, this pain is due to her rotator cuff tear. She is not  interested in surgery or steroid injection. I will refer her to physical therapy to strengthen the shoulder. I advised her that at 10mg  she can discontinue her Lexapro without a taper and that she could try 20mg  Protonix  Follow up plan: Follow up in 1 month for routine physical. Fast 8 hours before arrival for lab draw.    Patient seen and examined with Brock Bad medical student, agree with assessment and plan above Caryl Pina, MD [facility] 05/16/2017, 11:39 AM

## 2017-05-25 ENCOUNTER — Encounter: Payer: Self-pay | Admitting: Physical Therapy

## 2017-05-25 ENCOUNTER — Ambulatory Visit: Payer: Medicare Other | Attending: Family Medicine | Admitting: Physical Therapy

## 2017-05-25 DIAGNOSIS — M25511 Pain in right shoulder: Secondary | ICD-10-CM

## 2017-05-25 DIAGNOSIS — M6281 Muscle weakness (generalized): Secondary | ICD-10-CM
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
## 2017-05-25 NOTE — Patient Instructions (Signed)
Strengthening: Isometric Flexion  Using wall for resistance, press right fist into ball using light pressure. Hold ____ seconds. Repeat ____ times per set. Do ____ sets per session. Do ____ sessions per day.  SHOULDER: Abduction (Isometric)  Use wall as resistance. Press arm against pillow. Keep elbow straight. Hold ___ seconds. ___ reps per set, ___ sets per day, ___ days per week  Extension (Isometric)  Place left bent elbow and back of arm against wall. Press elbow against wall. Hold ____ seconds. Repeat ____ times. Do ____ sessions per day.  Internal Rotation (Isometric)  Place palm of right fist against door frame, with elbow bent. Press fist against door frame. Hold ____ seconds. Repeat ____ times. Do ____ sessions per day.  External Rotation (Isometric)  Place back of left fist against door frame, with elbow bent. Press fist against door frame. Hold ____ seconds. Repeat ____ times. Do ____ sessions per day.  Copyright  VHI. All rights reserved.    

## 2017-05-27 ENCOUNTER — Ambulatory Visit: Payer: Medicare Other | Admitting: *Deleted

## 2017-05-27 DIAGNOSIS — M25511 Pain in right shoulder: Secondary | ICD-10-CM

## 2017-05-27 DIAGNOSIS — M6281 Muscle weakness (generalized): Secondary | ICD-10-CM
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
## 2017-05-30 ENCOUNTER — Ambulatory Visit: Payer: Medicare Other | Admitting: Physical Therapy

## 2017-05-30 ENCOUNTER — Encounter: Payer: Self-pay | Admitting: Physical Therapy

## 2017-05-30 DIAGNOSIS — M25511 Pain in right shoulder: Secondary | ICD-10-CM

## 2017-05-30 DIAGNOSIS — M6281 Muscle weakness (generalized): Secondary | ICD-10-CM | POA: Diagnosis not present
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
## 2017-06-01 ENCOUNTER — Encounter: Payer: Self-pay | Admitting: Physical Therapy

## 2017-06-01 ENCOUNTER — Ambulatory Visit: Payer: Medicare Other | Admitting: Physical Therapy

## 2017-06-01 DIAGNOSIS — M6281 Muscle weakness (generalized): Secondary | ICD-10-CM

## 2017-06-01 DIAGNOSIS — M25511 Pain in right shoulder: Secondary | ICD-10-CM | POA: Diagnosis not present
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
## 2017-06-06 ENCOUNTER — Encounter: Payer: Self-pay | Admitting: Physical Therapy

## 2017-06-06 ENCOUNTER — Ambulatory Visit: Payer: Medicare Other | Admitting: Physical Therapy

## 2017-06-06 DIAGNOSIS — M6281 Muscle weakness (generalized): Secondary | ICD-10-CM

## 2017-06-06 DIAGNOSIS — M25511 Pain in right shoulder: Secondary | ICD-10-CM | POA: Diagnosis not present
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
## 2017-06-08 ENCOUNTER — Encounter: Payer: Self-pay | Admitting: Physical Therapy

## 2017-06-08 ENCOUNTER — Other Ambulatory Visit: Payer: Medicare Other

## 2017-06-08 ENCOUNTER — Ambulatory Visit: Payer: Medicare Other | Admitting: Physical Therapy

## 2017-06-08 DIAGNOSIS — M25511 Pain in right shoulder: Secondary | ICD-10-CM | POA: Diagnosis not present

## 2017-06-08 DIAGNOSIS — E782 Mixed hyperlipidemia: Secondary | ICD-10-CM

## 2017-06-08 DIAGNOSIS — M6281 Muscle weakness (generalized): Secondary | ICD-10-CM

## 2017-06-08 DIAGNOSIS — I10 Essential (primary) hypertension: Secondary | ICD-10-CM | POA: Diagnosis not present

## 2017-06-08 LAB — CBC WITH DIFFERENTIAL/PLATELET
Basophils Absolute: 0 10*3/uL (ref 0.0–0.2)
Basos: 1 %
EOS (ABSOLUTE): 0.1 10*3/uL (ref 0.0–0.4)
Eos: 2 %
Hematocrit: 40.3 % (ref 34.0–46.6)
Hemoglobin: 14.2 g/dL (ref 11.1–15.9)
Immature Grans (Abs): 0 10*3/uL (ref 0.0–0.1)
Immature Granulocytes: 0 %
Lymphocytes Absolute: 2 10*3/uL (ref 0.7–3.1)
Lymphs: 39 %
MCH: 29.6 pg (ref 26.6–33.0)
MCHC: 35.2 g/dL (ref 31.5–35.7)
MCV: 84 fL (ref 79–97)
Monocytes Absolute: 0.5 10*3/uL (ref 0.1–0.9)
Monocytes: 9 %
Neutrophils Absolute: 2.6 10*3/uL (ref 1.4–7.0)
Neutrophils: 49 %
Platelets: 188 10*3/uL (ref 150–379)
RBC: 4.8 x10E6/uL (ref 3.77–5.28)
RDW: 14.7 % (ref 12.3–15.4)
WBC: 5.2 10*3/uL (ref 3.4–10.8)

## 2017-06-08 LAB — LIPID PANEL
Chol/HDL Ratio: 3 ratio (ref 0.0–4.4)
Cholesterol, Total: 186 mg/dL (ref 100–199)
HDL: 63 mg/dL (ref >39)
LDL Calculated: 98 mg/dL (ref 0–99)
Triglycerides: 125 mg/dL (ref 0–149)
VLDL Cholesterol Cal: 25 mg/dL (ref 5–40)

## 2017-06-08 LAB — CMP14+EGFR
ALT: 13 IU/L (ref 0–32)
AST: 18 IU/L (ref 0–40)
Albumin/Globulin Ratio: 2.5 — ABNORMAL HIGH (ref 1.2–2.2)
Albumin: 4.8 g/dL (ref 3.5–4.8)
Alkaline Phosphatase: 54 IU/L (ref 39–117)
BUN/Creatinine Ratio: 14 (ref 12–28)
BUN: 13 mg/dL (ref 8–27)
Bilirubin Total: 0.5 mg/dL (ref 0.0–1.2)
CO2: 26 mmol/L (ref 20–29)
Calcium: 9.4 mg/dL (ref 8.7–10.3)
Chloride: 102 mmol/L (ref 96–106)
Creatinine, Ser: 0.95 mg/dL (ref 0.57–1.00)
GFR calc Af Amer: 70 mL/min/{1.73_m2} (ref >59)
GFR calc non Af Amer: 61 mL/min/{1.73_m2} (ref >59)
Globulin, Total: 1.9 g/dL (ref 1.5–4.5)
Glucose: 87 mg/dL (ref 65–99)
Potassium: 4.1 mmol/L (ref 3.5–5.2)
Sodium: 141 mmol/L (ref 134–144)
Total Protein: 6.7 g/dL (ref 6.0–8.5)
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
## 2017-06-09 ENCOUNTER — Encounter: Payer: Self-pay | Admitting: Family Medicine

## 2017-06-09 ENCOUNTER — Ambulatory Visit (INDEPENDENT_AMBULATORY_CARE_PROVIDER_SITE_OTHER): Payer: Medicare Other | Admitting: Family Medicine

## 2017-06-09 VITALS — BP 119/73 | HR 74 | Temp 98.1°F | Ht 61.0 in | Wt 147.0 lb

## 2017-06-09 DIAGNOSIS — M75111 Incomplete rotator cuff tear or rupture of right shoulder, not specified as traumatic: Secondary | ICD-10-CM

## 2017-06-09 NOTE — Progress Notes (Signed)
BP 119/73   Pulse 74   Temp 98.1 F (36.7 C) (Oral)   Ht 5\' 1"  (1.549 m)   Wt 147 lb (66.7 kg)   BMI 27.78 kg/m    Subjective:    Patient ID: [name], female    DOB: 09-20-1946, 71 y.o.   MRN: [id number]  HPI: [name] is a 71 y.o. female presenting on 06/09/2017 for as a follow up for right shoulder pain exacerbated by lifting or moving her arm overhead. She has a history of partial right rotator cuff tear 5 years ago and elected not to have repair. She participated in physical therapy for the past 3 weeks without improvement and has an appointment with Dr. Corine Shelter at [facility] and is requesting that her physical therapy records be sent to the orthopedics office. She is following up here for a regular check-up next week at which time we will review her labs. She is due for a Dexa scan and will have it at her next appointment.   HPI  Relevant past medical, surgical, family and social history reviewed and updated as indicated. Interim medical history since our last visit reviewed. Allergies and medications reviewed and updated.  Review of Systems  Constitutional: Negative for chills and fever.  HENT: Negative for congestion, rhinorrhea, sinus pain, sinus pressure, sneezing and sore throat.   Respiratory: Negative for cough, shortness of breath and wheezing.   Cardiovascular: Negative for chest pain, palpitations and leg swelling.  Gastrointestinal: Positive for abdominal pain (gallbladder yesterday). Negative for constipation, diarrhea, nausea and vomiting.  Musculoskeletal: Positive for arthralgias (right shoulder) and myalgias (right shoulder). Negative for back pain and joint swelling.  Skin: Negative for color change, pallor and rash.  Neurological: Negative for dizziness, syncope, weakness, light-headedness, numbness and headaches.  Psychiatric/Behavioral: Negative for agitation, behavioral problems and confusion.    Per HPI unless specifically  indicated above     Objective:    BP 119/73   Pulse 74   Temp 98.1 F (36.7 C) (Oral)   Ht 5\' 1"  (1.549 m)   Wt 147 lb (66.7 kg)   BMI 27.78 kg/m   Wt Readings from Last 3 Encounters:  06/09/17 147 lb (66.7 kg)  05/16/17 147 lb (66.7 kg)  02/07/17 151 lb 12.8 oz (68.9 kg)    Physical Exam  Constitutional: She is oriented to person, place, and time. She appears well-developed and well-nourished.  HENT:  Head: Normocephalic and atraumatic.  Nose: Nose normal.  Mouth/Throat: Oropharynx is clear and moist. No oropharyngeal exudate.  Eyes: Pupils are equal, round, and reactive to light. Conjunctivae and EOM are normal. No scleral icterus.  Neck: Normal range of motion. Neck supple. No thyromegaly present.  Cardiovascular: Normal rate, regular rhythm and normal heart sounds.   No murmur heard. Pulmonary/Chest: Effort normal and breath sounds normal. No respiratory distress. She has no wheezes. She has no rales.  Abdominal: Soft. Bowel sounds are normal. She exhibits no distension. There is no tenderness. There is no rebound and no guarding.  Musculoskeletal: She exhibits tenderness (TTP over lateral and anterior right shoulder). She exhibits no deformity.  Right shoulder ROM reduced 2/2 pain Positive empty can test  Neurological: She is alert and oriented to person, place, and time. She has normal reflexes. She exhibits normal muscle tone.  Skin: Skin is warm and dry. No rash noted. No erythema. No pallor.  Psychiatric: She has a normal mood and affect. Her behavior is normal. Judgment and thought content  normal.  Nursing note and vitals reviewed.       Assessment & Plan:   Problem List Items Addressed This Visit    None    Visit Diagnoses    Incomplete tear of right rotator cuff    -  Primary   will see Dr Stann Mainland in [facility] ortho tomorrow, she wants to make sure we send the information along to the orthopedic that she has done PT and txs   Relevant Orders   Ambulatory  referral to Orthopedic Surgery      [name] is a 71 y.o. female presenting on 06/09/2017 for as a follow up for right shoulder pain exacerbated by lifting or moving her arm overhead. She has a history of partial right rotator cuff tear 5 years ago and elected not to have repair. She participated in physical therapy for the past 3 weeks without improvement and has an appointment tomorrow with Dr. Corine Shelter at [facility]. We will send over her physical therapy records. On exam she has TTP over anterior and lateral right shoulder as well as decreased ROM and positive empty can test. We will discuss her labs, her Dexa scan, and possible referral to general surgery for her gallbladder at her check-up next week.  Follow up plan: Follow up at your scheduled check-up on 06/16/17.  Patient seen and examined with Brock Bad, medical student. Agree with assessment and plan above.   Caryl Pina, MD [facility] 06/09/2017, 4:01 PM

## 2017-06-10 DIAGNOSIS — M7551 Bursitis of right shoulder: Secondary | ICD-10-CM | POA: Diagnosis not present

## 2017-06-10 DIAGNOSIS — M12811 Other specific arthropathies, not elsewhere classified, right shoulder: Secondary | ICD-10-CM | POA: Diagnosis not present

## 2017-06-14 ENCOUNTER — Encounter: Payer: Medicare Other | Admitting: Physical Therapy

## 2017-06-15 ENCOUNTER — Other Ambulatory Visit: Payer: Self-pay | Admitting: Family Medicine

## 2017-06-15 DIAGNOSIS — Z780 Asymptomatic menopausal state: Secondary | ICD-10-CM

## 2017-06-16 ENCOUNTER — Ambulatory Visit (INDEPENDENT_AMBULATORY_CARE_PROVIDER_SITE_OTHER): Payer: Medicare Other | Admitting: Family Medicine

## 2017-06-16 ENCOUNTER — Encounter: Payer: Self-pay | Admitting: Family Medicine

## 2017-06-16 ENCOUNTER — Ambulatory Visit (INDEPENDENT_AMBULATORY_CARE_PROVIDER_SITE_OTHER): Payer: Medicare Other

## 2017-06-16 VITALS — BP 122/75 | HR 80 | Temp 97.4°F | Ht 61.0 in | Wt 149.0 lb

## 2017-06-16 DIAGNOSIS — M810 Age-related osteoporosis without current pathological fracture: Secondary | ICD-10-CM

## 2017-06-16 DIAGNOSIS — E782 Mixed hyperlipidemia: Secondary | ICD-10-CM

## 2017-06-16 DIAGNOSIS — K219 Gastro-esophageal reflux disease without esophagitis: Secondary | ICD-10-CM

## 2017-06-16 DIAGNOSIS — Z780 Asymptomatic menopausal state: Secondary | ICD-10-CM | POA: Diagnosis not present

## 2017-06-16 DIAGNOSIS — I10 Essential (primary) hypertension: Secondary | ICD-10-CM | POA: Diagnosis not present

## 2017-06-16 NOTE — Progress Notes (Signed)
BP 122/75   Pulse 80   Temp (!) 97.4 F (36.3 C) (Oral)   Ht 5\' 1"  (1.549 m)   Wt 149 lb (67.6 kg)   BMI 28.15 kg/m    Subjective:    Patient ID: [name], female    DOB: 1946-07-13, 71 y.o.   MRN: [id number]  HPI: [name] is a 71 y.o. female presenting on 06/16/2017 for Hyperlipidemia (followup) and Hypertension   HPI Hyperlipidemia Patient is coming in for recheck of his hyperlipidemia. The patient is currently taking Pravastatin. They deny any issues with myalgias or history of liver damage from it. They deny any focal numbness or weakness or chest pain.   Hypertension Patient is currently on diet control, and their blood pressure today is 122/75. Patient denies any lightheadedness or dizziness. Patient denies headaches, blurred vision, chest pains, shortness of breath, or weakness. Denies any side effects from medication and is content with current medication.   GERD Patient is currently on pantoprazole.  She denies any major symptoms or abdominal pain or belching or burping. She denies any blood in her stool or lightheadedness or dizziness.   Osteoporosis Patient is coming in after DEXA scan to discuss results and possible treatments. She is artery tried oral bisphosphonates and did not tolerate them due to abdominal issues and would like to think about doing injectable bisphosphonates. She has not had any major fractures. Her scores show that she is in the osteoporosis range just barely which is a change from where it was previously. She is taking calcium and vitamin D regularly.  Relevant past medical, surgical, family and social history reviewed and updated as indicated. Interim medical history since our last visit reviewed. Allergies and medications reviewed and updated.  Review of Systems  Constitutional: Negative for chills and fever.  HENT: Negative for congestion, ear discharge and ear pain.   Eyes: Negative for redness and visual disturbance.    Respiratory: Negative for chest tightness and shortness of breath.   Cardiovascular: Negative for chest pain and leg swelling.  Gastrointestinal: Negative for abdominal pain, constipation, diarrhea and nausea.  Genitourinary: Negative for difficulty urinating and dysuria.  Musculoskeletal: Negative for back pain and gait problem.  Skin: Negative for rash.  Neurological: Negative for light-headedness and headaches.  Psychiatric/Behavioral: Negative for agitation and behavioral problems.  All other systems reviewed and are negative.   Per HPI unless specifically indicated above      Objective:    BP 122/75   Pulse 80   Temp (!) 97.4 F (36.3 C) (Oral)   Ht 5\' 1"  (1.549 m)   Wt 149 lb (67.6 kg)   BMI 28.15 kg/m   Wt Readings from Last 3 Encounters:  06/16/17 149 lb (67.6 kg)  06/09/17 147 lb (66.7 kg)  05/16/17 147 lb (66.7 kg)    Physical Exam  Constitutional: She is oriented to person, place, and time. She appears well-developed and well-nourished. No distress.  Eyes: Conjunctivae are normal.  Neck: Neck supple. No thyromegaly present.  Cardiovascular: Normal rate, regular rhythm, normal heart sounds and intact distal pulses.   No murmur heard. Pulmonary/Chest: Effort normal and breath sounds normal. No respiratory distress. She has no wheezes.  Abdominal: Soft. Bowel sounds are normal. She exhibits no distension. There is no tenderness. There is no rebound and no guarding.  Musculoskeletal: Normal range of motion. She exhibits no edema.  Lymphadenopathy:    She has no cervical adenopathy.  Neurological: She is alert and oriented  to person, place, and time. Coordination normal.  Skin: Skin is warm and dry. No rash noted. She is not diaphoretic.  Psychiatric: She has a normal mood and affect. Her behavior is normal.  Nursing note and vitals reviewed.     Assessment & Plan:   Problem List Items Addressed This Visit      Cardiovascular and Mediastinum   Hypertension      Digestive   GERD (gastroesophageal reflux disease)     Musculoskeletal and Integument   Osteoporosis     Other   HLD (hyperlipidemia) - Primary     continue current medications, she will go to discuss per [facility] injections with Drenda Freeze  Follow up plan: Return in about 6 months (around 12/14/2017), or if symptoms worsen or fail to improve.  Counseling provided for all of the vaccine components No orders of the defined types were placed in this encounter.   Caryl Pina, MD [facility] 06/21/2017, 11:42 AM

## 2017-07-19 ENCOUNTER — Telehealth: Payer: Self-pay | Admitting: Family Medicine

## 2017-07-19 NOTE — Telephone Encounter (Signed)
Pt aware FOBT is up front for her She is sch for flu tomorrow

## 2017-07-20 ENCOUNTER — Ambulatory Visit (INDEPENDENT_AMBULATORY_CARE_PROVIDER_SITE_OTHER): Payer: Medicare Other

## 2017-07-20 DIAGNOSIS — Z23 Encounter for immunization: Secondary | ICD-10-CM

## 2017-07-22 DIAGNOSIS — M7551 Bursitis of right shoulder: Secondary | ICD-10-CM | POA: Diagnosis not present

## 2017-07-22 DIAGNOSIS — M12811 Other specific arthropathies, not elsewhere classified, right shoulder: Secondary | ICD-10-CM | POA: Diagnosis not present

## 2017-07-27 ENCOUNTER — Other Ambulatory Visit: Payer: Medicare Other

## 2017-07-27 DIAGNOSIS — Z86010 Personal history of colonic polyps: Secondary | ICD-10-CM

## 2017-08-02 LAB — FECAL OCCULT BLOOD, IMMUNOCHEMICAL: Fecal Occult Bld: NEGATIVE

## 2017-08-19 IMAGING — CR DG HIP (WITH OR WITHOUT PELVIS) 2-3V*R*
2 series · 2 of 2 positions shown · non-contrast
Comparison: None.

CLINICAL DATA: Right hip pain.  No recent injury.

EXAM:
DG HIP (WITH OR WITHOUT PELVIS) 2-3V RIGHT

[view not recorded (1 of 2)]
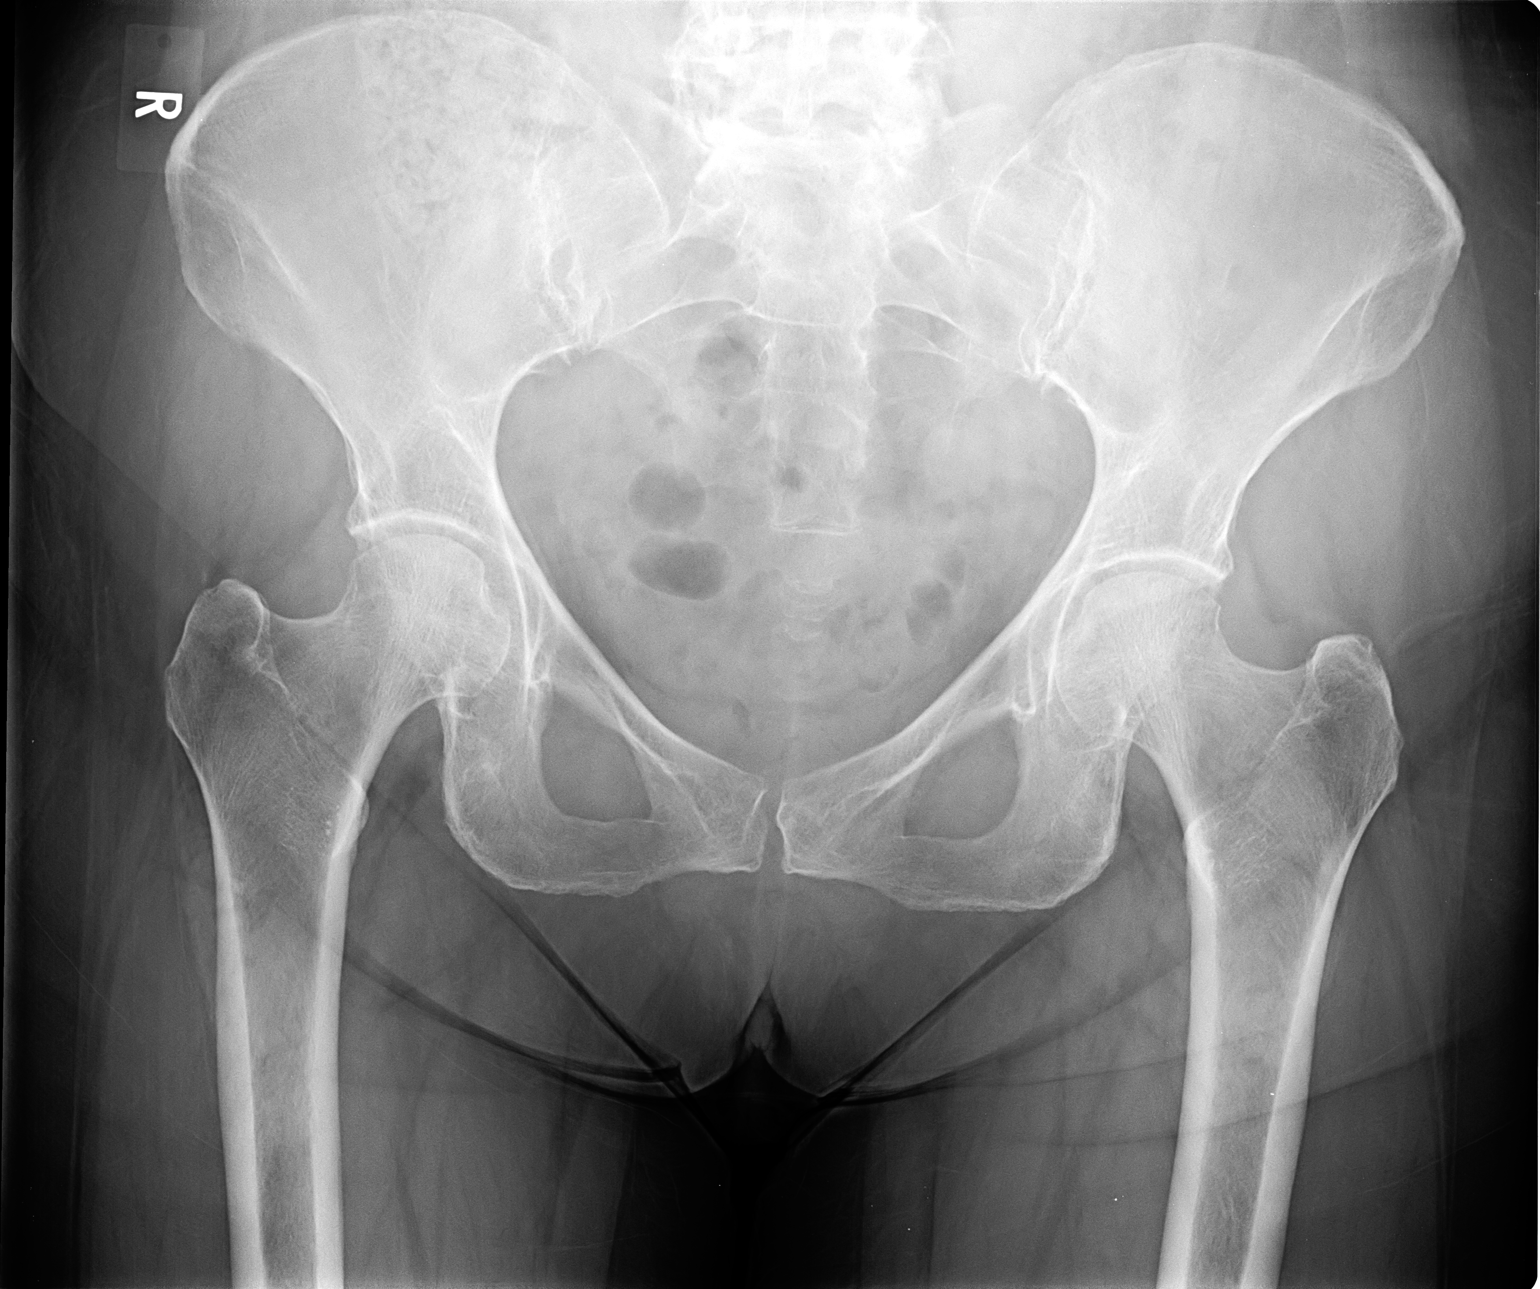

[view not recorded (2 of 2)]
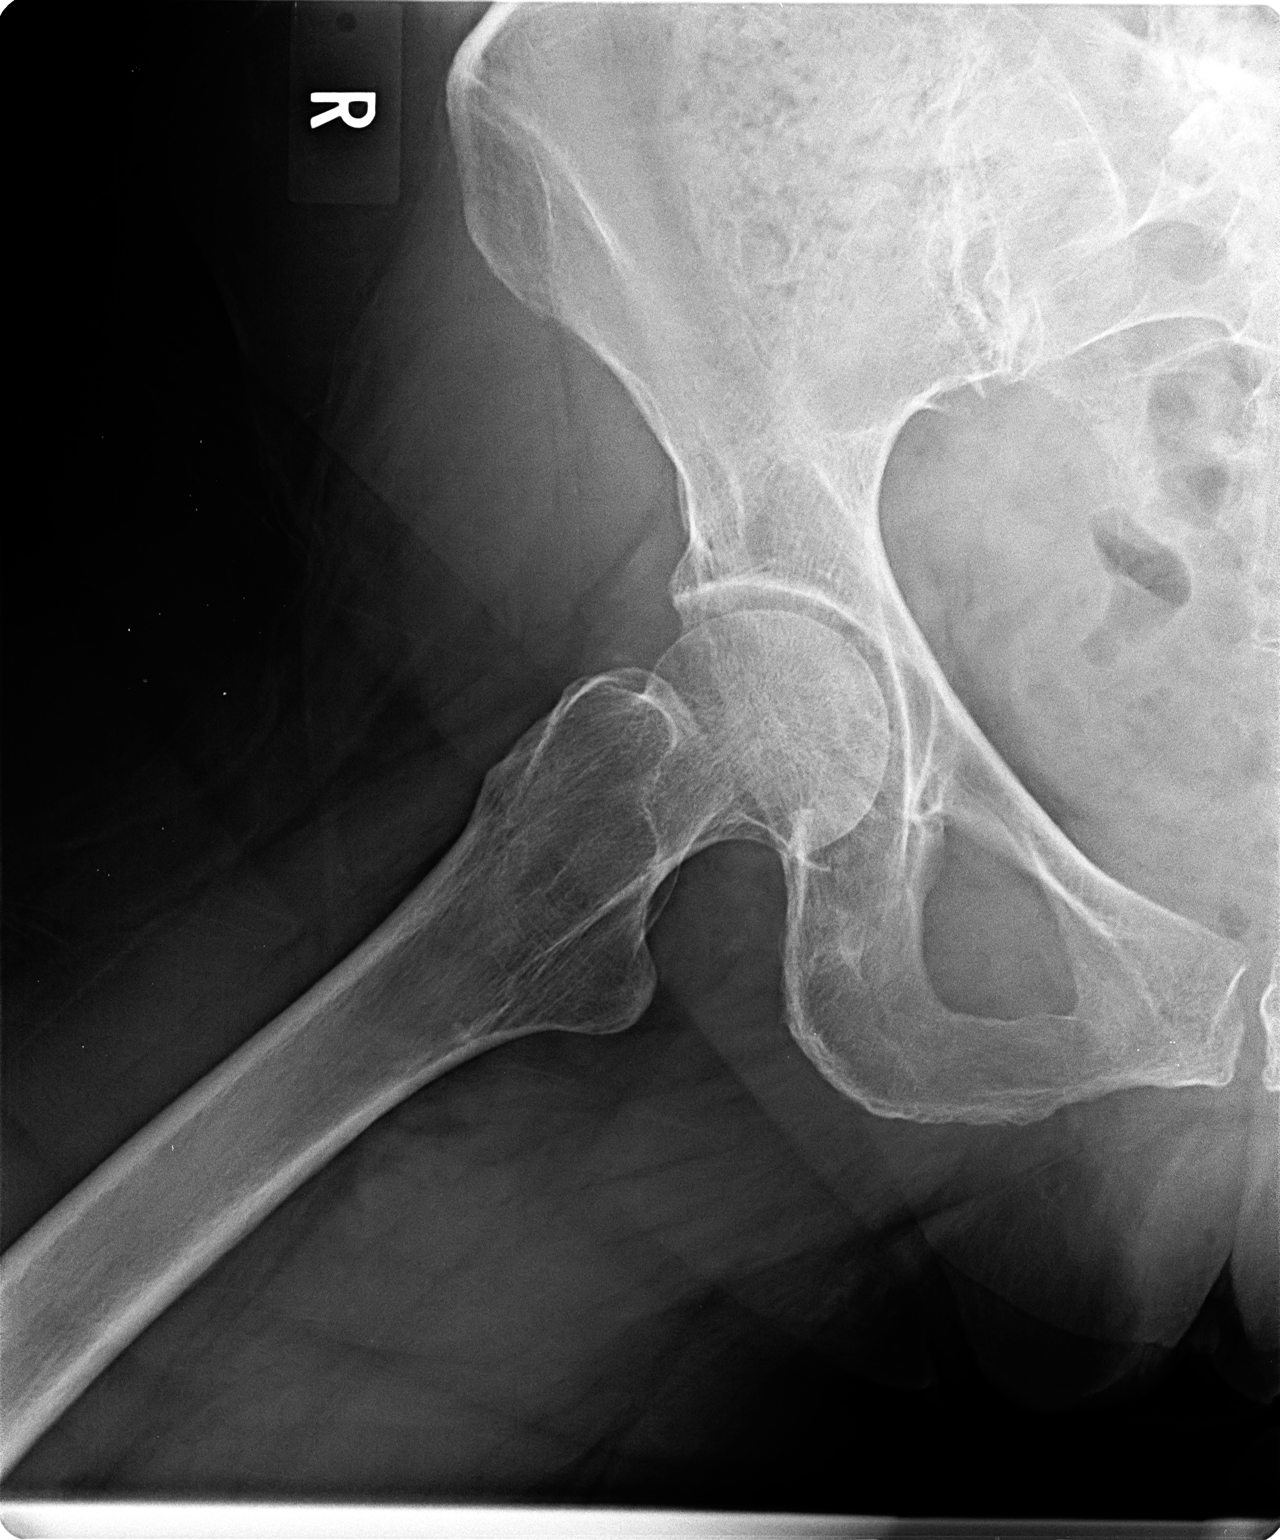

[2 of 2 positions shown; findings below may reference images not displayed]

FINDINGS: No fracture or bone lesion. Hip joints are normally spaced and
aligned with no significant arthropathic change. SI joints and
symphysis pubis are normally spaced and aligned. Bones are
demineralized. Soft tissues are unremarkable.
IMPRESSION: No fracture or bone lesion.  No hip joint abnormality.

## 2017-11-03 DIAGNOSIS — R69 Illness, unspecified: Secondary | ICD-10-CM | POA: Diagnosis not present

## 2017-12-01 ENCOUNTER — Encounter: Payer: Self-pay | Admitting: Family

## 2017-12-01 ENCOUNTER — Ambulatory Visit (INDEPENDENT_AMBULATORY_CARE_PROVIDER_SITE_OTHER): Payer: Medicare HMO | Admitting: Family

## 2017-12-01 VITALS — BP 111/71 | HR 83 | Temp 98.2°F | Ht 61.0 in | Wt 150.0 lb

## 2017-12-01 DIAGNOSIS — J029 Acute pharyngitis, unspecified: Secondary | ICD-10-CM | POA: Diagnosis not present

## 2017-12-01 DIAGNOSIS — J069 Acute upper respiratory infection, unspecified: Secondary | ICD-10-CM | POA: Diagnosis not present

## 2017-12-01 LAB — CULTURE, GROUP A STREP

## 2017-12-01 LAB — RAPID STREP SCREEN (MED CTR MEBANE ONLY): Strep Gp A Ag, IA W/Reflex: NEGATIVE

## 2017-12-01 MED ORDER — FLUTICASONE PROPIONATE 50 MCG/ACT NA SUSP: 2.0000 | Freq: Every day | NASAL | 6 refills | Status: DC

## 2017-12-01 NOTE — Patient Instructions (Signed)
Upper Respiratory Infection, Adult Most upper respiratory infections (URIs) are caused by a virus. A URI affects the nose, throat, and upper air passages. The most common type of URI is often called "the common cold." Follow these instructions at home:  Take medicines only as told by your doctor.  Gargle warm saltwater or take cough drops to comfort your throat as told by your doctor.  Use a warm mist humidifier or inhale steam from a shower to increase air moisture. This may make it easier to breathe.  Drink enough fluid to keep your pee (urine) clear or pale yellow.  Eat soups and other clear broths.  Have a healthy diet.  Rest as needed.  Go back to work when your fever is gone or your doctor says it is okay. ? You may need to stay home longer to avoid giving your URI to others. ? You can also wear a face mask and wash your hands often to prevent spread of the virus.  Use your inhaler more if you have asthma.  Do not use any tobacco products, including cigarettes, chewing tobacco, or electronic cigarettes. If you need help quitting, ask your doctor. Contact a doctor if:  You are getting worse, not better.  Your symptoms are not helped by medicine.  You have chills.  You are getting more short of breath.  You have brown or red mucus.  You have yellow or brown discharge from your nose.  You have pain in your face, especially when you bend forward.  You have a fever.  You have puffy (swollen) neck glands.  You have pain while swallowing.  You have white areas in the back of your throat. Get help right away if:  You have very bad or constant: ? Headache. ? Ear pain. ? Pain in your forehead, behind your eyes, and over your cheekbones (sinus pain). ? Chest pain.  You have long-lasting (chronic) lung disease and any of the following: ? Wheezing. ? Long-lasting cough. ? Coughing up blood. ? A change in your usual mucus.  You have a stiff neck.  You have  changes in your: ? Vision. ? Hearing. ? Thinking. ? Mood. This information is not intended to replace advice given to you by your health care provider. Make sure you discuss any questions you have with your health care provider. Document Released: 03/15/2008 Document Revised: 05/30/2016 Document Reviewed: 01/02/2014 Elsevier Interactive Patient Education  2018 Elsevier Inc.  

## 2017-12-01 NOTE — Progress Notes (Signed)
   Subjective:    Patient ID: [name], female    DOB: 1946-05-04, 72 y.o.   MRN: [id number]  Otalgia   There is pain in the right ear. This is a new problem. The current episode started 1 to 4 weeks ago. The problem occurs every few minutes. The problem has been unchanged. There has been no fever. The pain is at a severity of 9/10. The pain is mild. Associated symptoms include headaches and a sore throat. Pertinent negatives include no coughing, ear discharge, hearing loss or rhinorrhea. She has tried acetaminophen and NSAIDs (mucinex) for the symptoms. The treatment provided mild relief.      Review of Systems  HENT: Positive for ear pain and sore throat. Negative for ear discharge, hearing loss and rhinorrhea.   Respiratory: Negative for cough.   Neurological: Positive for headaches.  All other systems reviewed and are negative.      Objective:   Physical Exam  Constitutional: She is oriented to person, place, and time. She appears well-developed and well-nourished. No distress.  HENT:  Head: Normocephalic and atraumatic.  Right Ear: External ear normal.  Left Ear: External ear normal.  Nose: Mucosal edema and rhinorrhea present.  Mouth/Throat: Posterior oropharyngeal erythema present.  Eyes: Pupils are equal, round, and reactive to light.  Neck: Normal range of motion. Neck supple. No thyromegaly present.  Cardiovascular: Normal rate, regular rhythm, normal heart sounds and intact distal pulses.  No murmur heard. Pulmonary/Chest: Effort normal and breath sounds normal. No respiratory distress. She has no wheezes.  Abdominal: Soft. Bowel sounds are normal. She exhibits no distension. There is no tenderness.  Musculoskeletal: Normal range of motion. She exhibits no edema or tenderness.  Neurological: She is alert and oriented to person, place, and time.  Skin: Skin is warm and dry.  Psychiatric: She has a normal mood and affect. Her behavior is normal. Judgment and thought  content normal.  Vitals reviewed.     BP 111/71   Pulse 83   Temp 98.2 F (36.8 C) (Oral)   Ht 5\' 1"  (1.549 m)   Wt 150 lb (68 kg)   BMI 28.34 kg/m      Assessment & Plan:  1. Sore throat - Rapid Strep Screen (Not at [facility])  2. Viral upper respiratory tract infection - Take meds as prescribed - Use a cool mist humidifier  -Use saline nose sprays frequently -Force fluids -For any cough or congestion  Use plain Mucinex- regular strength or max strength is fine -For fever or aces or pains- take tylenol or ibuprofen. -Throat lozenges if help -New toothbrush in 3 days - fluticasone (FLONASE) 50 MCG/ACT nasal spray; Place 2 sprays into both nostrils daily.  Dispense: 16 g; Refill: [place], FNP

## 2017-12-05 ENCOUNTER — Encounter: Payer: Self-pay | Admitting: Gastroenterology

## 2017-12-12 ENCOUNTER — Encounter: Payer: Self-pay | Admitting: Family Medicine

## 2017-12-12 DIAGNOSIS — E785 Hyperlipidemia, unspecified: Secondary | ICD-10-CM

## 2017-12-12 DIAGNOSIS — I10 Essential (primary) hypertension: Secondary | ICD-10-CM

## 2017-12-13 ENCOUNTER — Other Ambulatory Visit: Payer: Medicare HMO

## 2017-12-13 DIAGNOSIS — E785 Hyperlipidemia, unspecified: Secondary | ICD-10-CM | POA: Diagnosis not present

## 2017-12-13 DIAGNOSIS — I10 Essential (primary) hypertension: Secondary | ICD-10-CM | POA: Diagnosis not present

## 2017-12-14 LAB — CMP14+EGFR
ALT: 13 IU/L (ref 0–32)
AST: 17 IU/L (ref 0–40)
Albumin/Globulin Ratio: 2.4 — ABNORMAL HIGH (ref 1.2–2.2)
Albumin: 4.4 g/dL (ref 3.5–4.8)
Alkaline Phosphatase: 58 IU/L (ref 39–117)
BUN/Creatinine Ratio: 13 (ref 12–28)
BUN: 10 mg/dL (ref 8–27)
Bilirubin Total: 0.4 mg/dL (ref 0.0–1.2)
CO2: 23 mmol/L (ref 20–29)
Calcium: 9.2 mg/dL (ref 8.7–10.3)
Chloride: 103 mmol/L (ref 96–106)
Creatinine, Ser: 0.79 mg/dL (ref 0.57–1.00)
GFR calc Af Amer: 88 mL/min/{1.73_m2} (ref >59)
GFR calc non Af Amer: 76 mL/min/{1.73_m2} (ref >59)
Globulin, Total: 1.8 g/dL (ref 1.5–4.5)
Glucose: 88 mg/dL (ref 65–99)
Potassium: 4.1 mmol/L (ref 3.5–5.2)
Sodium: 141 mmol/L (ref 134–144)
Total Protein: 6.2 g/dL (ref 6.0–8.5)

## 2017-12-14 LAB — CBC WITH DIFFERENTIAL/PLATELET
Basophils Absolute: 0 10*3/uL (ref 0.0–0.2)
Basos: 1 %
EOS (ABSOLUTE): 0.1 10*3/uL (ref 0.0–0.4)
Eos: 2 %
Hematocrit: 40.1 % (ref 34.0–46.6)
Hemoglobin: 13 g/dL (ref 11.1–15.9)
Immature Grans (Abs): 0 10*3/uL (ref 0.0–0.1)
Immature Granulocytes: 0 %
Lymphocytes Absolute: 2.3 10*3/uL (ref 0.7–3.1)
Lymphs: 40 %
MCH: 27.5 pg (ref 26.6–33.0)
MCHC: 32.4 g/dL (ref 31.5–35.7)
MCV: 85 fL (ref 79–97)
Monocytes Absolute: 0.3 10*3/uL (ref 0.1–0.9)
Monocytes: 6 %
Neutrophils Absolute: 2.9 10*3/uL (ref 1.4–7.0)
Neutrophils: 51 %
Platelets: 221 10*3/uL (ref 150–379)
RBC: 4.72 x10E6/uL (ref 3.77–5.28)
RDW: 13.9 % (ref 12.3–15.4)
WBC: 5.6 10*3/uL (ref 3.4–10.8)

## 2017-12-14 LAB — LIPID PANEL
Chol/HDL Ratio: 3.2 ratio (ref 0.0–4.4)
Cholesterol, Total: 200 mg/dL — ABNORMAL HIGH (ref 100–199)
HDL: 63 mg/dL (ref >39)
LDL Calculated: 114 mg/dL — ABNORMAL HIGH (ref 0–99)
Triglycerides: 114 mg/dL (ref 0–149)
VLDL Cholesterol Cal: 23 mg/dL (ref 5–40)

## 2017-12-15 ENCOUNTER — Ambulatory Visit (INDEPENDENT_AMBULATORY_CARE_PROVIDER_SITE_OTHER): Payer: Medicare HMO | Admitting: Family Medicine

## 2017-12-15 ENCOUNTER — Encounter: Payer: Self-pay | Admitting: Family Medicine

## 2017-12-15 VITALS — BP 133/86 | HR 82 | Temp 98.0°F | Ht 61.0 in | Wt 152.0 lb

## 2017-12-15 DIAGNOSIS — E782 Mixed hyperlipidemia: Secondary | ICD-10-CM | POA: Diagnosis not present

## 2017-12-15 DIAGNOSIS — I10 Essential (primary) hypertension: Secondary | ICD-10-CM | POA: Diagnosis not present

## 2017-12-15 DIAGNOSIS — K219 Gastro-esophageal reflux disease without esophagitis: Secondary | ICD-10-CM

## 2017-12-15 MED ORDER — PRAVASTATIN SODIUM 20 MG PO TABS: 20.0000 mg | ORAL_TABLET | Freq: Every day | ORAL | 3 refills | Status: DC

## 2017-12-15 NOTE — Progress Notes (Signed)
BP 133/86   Pulse 82   Temp 98 F (36.7 C) (Oral)   Ht 5' 1"  (1.549 m)   Wt 152 lb (68.9 kg)   BMI 28.72 kg/m    Subjective:    Patient ID: [name], female    DOB: 05/02/1946, 72 y.o.   MRN: [id number]  HPI: [name] is a 72 y.o. female presenting on 12/15/2017 for Hyperlipidemia (6 mo) and Hypertension   HPI Hyperlipidemia Patient is coming in for recheck of his hyperlipidemia. The patient is currently taking no medication except fish oil but will start on pravastatin for risk factors and prevention. They deny any issues with myalgias or history of liver damage from it. They deny any focal numbness or weakness or chest pain.   Hypertension Patient is currently on no medication and is on diet control for now, and their blood pressure today is 133/86. Patient denies any lightheadedness or dizziness. Patient denies headaches, blurred vision, chest pains, shortness of breath, or weakness. Denies any side effects from medication and is content with current medication.   GERD Patient is currently on no medication but is having a lot more symptoms so she is consider trying famotidine.  She is having some belching and burping but denies any abdominal pain. She denies any blood in her stool or lightheadedness or dizziness.   Relevant past medical, surgical, family and social history reviewed and updated as indicated. Interim medical history since our last visit reviewed. Allergies and medications reviewed and updated.  Review of Systems  Constitutional: Negative for chills and fever.  Eyes: Negative for visual disturbance.  Respiratory: Negative for chest tightness and shortness of breath.   Cardiovascular: Negative for chest pain and leg swelling.  Gastrointestinal: Positive for abdominal distention and nausea. Negative for abdominal pain, constipation, diarrhea and vomiting.  Genitourinary: Negative for difficulty urinating and dysuria.  Musculoskeletal: Negative for back  pain and gait problem.  Skin: Negative for rash.  Neurological: Negative for dizziness, light-headedness and headaches.  Psychiatric/Behavioral: Negative for agitation and behavioral problems.  All other systems reviewed and are negative.   Per HPI unless specifically indicated above   Allergies as of 12/15/2017      Reactions   Benadryl [diphenhydramine Hcl]    Nsaids Hives   Teriparatide Hives      Medication List        Accurate as of 12/15/17 10:15 AM. Always use your most recent med list.          ALAWAY 0.025 % ophthalmic solution Generic drug:  ketotifen Place 1 drop into both eyes 2 (two) times daily.   aluminum hydroxide-magnesium carbonate 95-358 MG/15ML Susp Commonly known as:  GAVISCON Take by mouth.   CALCIUM 500+D 500-200 MG-UNIT Tabs Generic drug:  Calcium Carb-Cholecalciferol Take 1 tablet by mouth daily.   docusate sodium 100 MG capsule Commonly known as:  COLACE Take 100 mg by mouth daily as needed.   Fish Oil 1000 MG Caps Take by mouth.   hyoscyamine 0.375 MG 12 hr tablet Commonly known as:  LEVBID Take 1 tablet (0.375 mg total) by mouth 2 (two) times daily as needed.   hyoscyamine 0.125 MG SL tablet Commonly known as:  LEVSIN SL DISSOLVE ONE TABLET UNDER TONGUE EVERY EIGHT HOURS   Magnesium 300 MG Caps Take 300 mg by mouth 2 (two) times daily.   multivitamin capsule Take 1 capsule by mouth daily.   NASACORT ALLERGY 24HR NA Place 1 spray into the nose daily.  nitroGLYCERIN 0.4 MG SL tablet Commonly known as:  NITROSTAT Place 1 tablet (0.4 mg total) under the tongue every 5 (five) minutes as needed for chest pain.   OVER THE COUNTER MEDICATION Take 400 mg by mouth 2 (two) times daily.   SALINE NASAL SPRAY NA Place into the nose as directed.   TYLENOL 500 MG tablet Generic drug:  acetaminophen Take 625 mg by mouth as needed.          Objective:    BP 133/86   Pulse 82   Temp 98 F (36.7 C) (Oral)   Ht 5' 1"  (1.549 m)    Wt 152 lb (68.9 kg)   BMI 28.72 kg/m   Wt Readings from Last 3 Encounters:  12/15/17 152 lb (68.9 kg)  12/01/17 150 lb (68 kg)  06/16/17 149 lb (67.6 kg)    Physical Exam  Constitutional: She is oriented to person, place, and time. She appears well-developed and well-nourished. No distress.  Eyes: Conjunctivae are normal.  Neck: Neck supple. No thyromegaly present.  Cardiovascular: Normal rate, regular rhythm, normal heart sounds and intact distal pulses.  No murmur heard. Pulmonary/Chest: Effort normal and breath sounds normal. No respiratory distress. She has no wheezes. She has no rales.  Musculoskeletal: Normal range of motion.  Lymphadenopathy:    She has no cervical adenopathy.  Neurological: She is alert and oriented to person, place, and time. Coordination normal.  Skin: Skin is warm and dry. No rash noted. She is not diaphoretic.  Psychiatric: She has a normal mood and affect. Her behavior is normal.  Nursing note and vitals reviewed.   Results for orders placed or performed in visit on 12/13/17  Lipid panel  Result Value Ref Range   Cholesterol, Total 200 (H) 100 - 199 mg/dL   Triglycerides 114 0 - 149 mg/dL   HDL 63 >39 mg/dL   VLDL Cholesterol Cal 23 5 - 40 mg/dL   LDL Calculated 114 (H) 0 - 99 mg/dL   Chol/HDL Ratio 3.2 0.0 - 4.4 ratio  CMP14+EGFR  Result Value Ref Range   Glucose 88 65 - 99 mg/dL   BUN 10 8 - 27 mg/dL   Creatinine, Ser 0.79 0.57 - 1.00 mg/dL   GFR calc non Af Amer 76 >59 mL/min/1.73   GFR calc Af Amer 88 >59 mL/min/1.73   BUN/Creatinine Ratio 13 12 - 28   Sodium 141 134 - 144 mmol/L   Potassium 4.1 3.5 - 5.2 mmol/L   Chloride 103 96 - 106 mmol/L   CO2 23 20 - 29 mmol/L   Calcium 9.2 8.7 - 10.3 mg/dL   Total Protein 6.2 6.0 - 8.5 g/dL   Albumin 4.4 3.5 - 4.8 g/dL   Globulin, Total 1.8 1.5 - 4.5 g/dL   Albumin/Globulin Ratio 2.4 (H) 1.2 - 2.2   Bilirubin Total 0.4 0.0 - 1.2 mg/dL   Alkaline Phosphatase 58 39 - 117 IU/L   AST 17 0 - 40  IU/L   ALT 13 0 - 32 IU/L  CBC with Differential/Platelet  Result Value Ref Range   WBC 5.6 3.4 - 10.8 x10E3/uL   RBC 4.72 3.77 - 5.28 x10E6/uL   Hemoglobin 13.0 11.1 - 15.9 g/dL   Hematocrit 40.1 34.0 - 46.6 %   MCV 85 79 - 97 fL   MCH 27.5 26.6 - 33.0 pg   MCHC 32.4 31.5 - 35.7 g/dL   RDW 13.9 12.3 - 15.4 %   Platelets 221 150 - 379 x10E3/uL  Neutrophils 51 Not Estab. %   Lymphs 40 Not Estab. %   Monocytes 6 Not Estab. %   Eos 2 Not Estab. %   Basos 1 Not Estab. %   Neutrophils Absolute 2.9 1.4 - 7.0 x10E3/uL   Lymphocytes Absolute 2.3 0.7 - 3.1 x10E3/uL   Monocytes Absolute 0.3 0.1 - 0.9 x10E3/uL   EOS (ABSOLUTE) 0.1 0.0 - 0.4 x10E3/uL   Basophils Absolute 0.0 0.0 - 0.2 x10E3/uL   Immature Granulocytes 0 Not Estab. %   Immature Grans (Abs) 0.0 0.0 - 0.1 x10E3/uL      Assessment & Plan:   Problem List Items Addressed This Visit      Cardiovascular and Mediastinum   Hypertension - Primary   Relevant Medications   pravastatin (PRAVACHOL) 20 MG tablet     Digestive   GERD (gastroesophageal reflux disease)     Other   HLD (hyperlipidemia)   Relevant Medications   pravastatin (PRAVACHOL) 20 MG tablet      Cholesterol is borderline at best but discussed with patient and she would like to go on a low-dose statin for cardiac and stroke prevention based on her risk factors and age. Follow up plan: Return in about 6 months (around 06/17/2018), or if symptoms worsen or fail to improve, for Hypertension cholesterol recheck.  Counseling provided for all of the vaccine components No orders of the defined types were placed in this encounter.   Caryl Pina, MD [facility] 12/15/2017, 10:15 AM

## 2018-01-03 ENCOUNTER — Encounter: Payer: Self-pay | Admitting: Gastroenterology

## 2018-01-03 ENCOUNTER — Ambulatory Visit: Payer: Medicare HMO | Admitting: Gastroenterology

## 2018-01-03 VITALS — BP 130/78 | HR 82 | Ht 60.0 in | Wt 150.5 lb

## 2018-01-03 DIAGNOSIS — R1310 Dysphagia, unspecified: Secondary | ICD-10-CM | POA: Diagnosis not present

## 2018-01-03 DIAGNOSIS — K2100 Gastro-esophageal reflux disease with esophagitis, without bleeding: Secondary | ICD-10-CM

## 2018-01-03 DIAGNOSIS — Z86010 Personal history of colonic polyps: Secondary | ICD-10-CM

## 2018-01-03 MED ORDER — RANITIDINE HCL 150 MG PO TABS: 150.0000 mg | ORAL_TABLET | Freq: Two times a day (BID) | ORAL | 11 refills | Status: DC

## 2018-01-03 MED ORDER — SUPREP BOWEL PREP KIT 17.5-3.13-1.6 GM/177ML PO SOLN: 1.0000 | ORAL | 0 refills | Status: DC
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
## 2018-01-03 NOTE — Patient Instructions (Signed)
Normal BMI (Body Mass Index- based on height and weight) is between 23 and 30. Your BMI today is Body mass index is 29.39 kg/m. Marland Kitchen Please consider follow up  regarding your BMI with your Primary Care Provider.  You have been scheduled for a colonoscopy. Please follow written instructions given to you at your visit today.  Please pick up your prep supplies at the pharmacy within the next 1-3 days. If you use inhalers (even only as needed), please bring them with you on the day of your procedure. Your physician has requested that you go to www.startemmi.com and enter the access code given to you at your visit today. This web site gives a general overview about your procedure. However, you should still follow specific instructions given to you by our office regarding your preparation for the procedure.  We have sent medications to your pharmacy for you to pick up at your convenience:

## 2018-01-19 ENCOUNTER — Encounter: Payer: Self-pay | Admitting: *Deleted

## 2018-01-19 ENCOUNTER — Ambulatory Visit (INDEPENDENT_AMBULATORY_CARE_PROVIDER_SITE_OTHER): Payer: Medicare HMO | Admitting: *Deleted

## 2018-01-19 ENCOUNTER — Encounter: Payer: Self-pay | Admitting: Pediatrics

## 2018-01-19 ENCOUNTER — Ambulatory Visit (INDEPENDENT_AMBULATORY_CARE_PROVIDER_SITE_OTHER): Payer: Medicare HMO | Admitting: Pediatrics

## 2018-01-19 VITALS — BP 119/72 | HR 98 | Ht 60.5 in | Wt 150.0 lb

## 2018-01-19 VITALS — BP 119/72 | HR 98 | Temp 99.1°F | Ht 65.0 in | Wt 150.0 lb

## 2018-01-19 DIAGNOSIS — J069 Acute upper respiratory infection, unspecified: Secondary | ICD-10-CM

## 2018-01-19 DIAGNOSIS — Z0000 Encounter for general adult medical examination without abnormal findings: Secondary | ICD-10-CM | POA: Diagnosis not present

## 2018-01-19 NOTE — Patient Instructions (Signed)
  [name] , Thank you for taking time to come for your Medicare Wellness Visit. I appreciate your ongoing commitment to your health goals. Please review the following plan we discussed and let me know if I can assist you in the future.   These are the goals we discussed: Goals    . Exercise 150 min/wk Moderate Activity       This is a list of the screening recommended for you and due dates:  Health Maintenance  Topic Date Due  . Colon Cancer Screening  11/28/2017  . Flu Shot  05/11/2018  . Stool Blood Test  07/27/2018  . Mammogram  04/02/2019  . DEXA scan (bone density measurement)  06/17/2019  . Tetanus Vaccine  06/19/2026  .  Hepatitis C: One time screening is recommended by Center for Disease Control  (CDC) for  adults born from [birth year] through 1964.   Completed  . Pneumonia vaccines  Completed

## 2018-01-19 NOTE — Patient Instructions (Signed)
Fever reducer and headache: tylenol and ibuprofen, can take together or alternating   Sinus pressure:  Nasal steroid such as flonase/fluticaone or nasocort daily Can also take daily antihistamine such as loratadine/claritin or cetirizine/zyrtec  Sinus rinses/irritation: Netipot or similar with distilled water 2-3 times a day to clear out sinuses or Normal saline nasal spray  Sore throat:  Throat lozenges chloroseptic spray  Stick with bland foods Drink lots of fluids  

## 2018-01-19 NOTE — Progress Notes (Signed)
  Subjective:   Patient ID: [name], female    DOB: [birth year], 72 y.o.   MRN: [id number] CC: URI (cough x 3 days and nasal congestion and sneezing x 1 day. Has been taking mucinex 800mg  TID, ventolin PRN, and has increased water intake)  HPI: [name] is a 72 y.o. female presenting for URI (cough x 3 days and nasal congestion and sneezing x 1 day. Has been taking mucinex 800mg  TID, ventolin PRN, and has increased water intake)  Low-grade temperatures she thinks, has not been checking at home.  Not taking any Tylenol.  Does use Nasacort every day.  Has also been exposed to the outside more, she often gets allergy symptoms at this time a year.  Has a dry cough.  Nonproductive.  No wheezing.  Throat has been scratchy.  Appetite is down.  Drinking lots of fluids.  Normal stooling.  No rashes.  Relevant past medical, surgical, family and social history reviewed. Allergies and medications reviewed and updated. Social History   Tobacco Use  Smoking Status Never Smoker  Smokeless Tobacco Never Used   ROS: Per HPI   Objective:    BP 119/72 (BP Location: Right Arm, Patient Position: Sitting, Cuff Size: Normal)   Pulse 98   Temp 99.1 F (37.3 C) (Oral)   Ht 5\' 5"  (1.651 m)   Wt 150 lb (68 kg)   BMI 24.96 kg/m   Wt Readings from Last 3 Encounters:  01/19/18 150 lb (68 kg)  01/19/18 150 lb (68 kg)  01/03/18 150 lb 8 oz (68.3 kg)    Gen: NAD, alert, cooperative with exam, NCAT EYES: EOMI, no conjunctival injection, or no icterus ENT:  TMs pearly gray b/l, OP without erythema LYMPH: no cervical LAD CV: NRRR, normal S1/S2, no murmur, distal pulses 2+ b/l Resp: CTABL, no wheezes, normal WOB Abd: +BS, soft, NTND. no guarding or organomegaly Ext: No edema, warm Neuro: Alert and oriented  Assessment & Plan:  [name] was seen today for uri.  Diagnoses and all orders for this visit:  Acute URI Symptom care, return precautions discussed.  Follow up plan: As needed Assunta Found, MD [place]
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
## 2018-03-06 ENCOUNTER — Encounter: Payer: Self-pay | Admitting: Gastroenterology

## 2018-03-07 ENCOUNTER — Encounter: Payer: Self-pay | Admitting: Family Medicine

## 2018-03-07 ENCOUNTER — Ambulatory Visit (INDEPENDENT_AMBULATORY_CARE_PROVIDER_SITE_OTHER): Payer: Medicare HMO | Admitting: Family Medicine

## 2018-03-07 VITALS — BP 125/67 | HR 78 | Temp 97.7°F | Ht 65.0 in | Wt 150.0 lb

## 2018-03-07 DIAGNOSIS — M7062 Trochanteric bursitis, left hip: Secondary | ICD-10-CM

## 2018-03-07 MED ORDER — METHYLPREDNISOLONE ACETATE 80 MG/ML IJ SUSP
80.0000 mg | Freq: Once | INTRAMUSCULAR | Status: AC
  Administered 2018-03-07: 80 mg via INTRAMUSCULAR

## 2018-03-07 NOTE — Progress Notes (Signed)
BP 125/67 (BP Location: Right Arm)   Pulse 78   Temp 97.7 F (36.5 C) (Oral)   Ht _0  (1.651 m)   Wt 150 lb (68 kg)   BMI 24.96 kg/m    Subjective:    Patient ID: [name], female    DOB: Jan 29, 1946, 72 y.o.   MRN: [id number]  HPI: [name] is a 72 y.o. female presenting on 03/07/2018 for left hip pain and right shoulder pain   HPI Left hip pain Patient is coming in complaining of left hip pain that is been hurting her over the past month.  She had taken a trip to [facility] and had a lot of driving and traveling and she is been hurting on the outside of her left hip since that time.  She says it hurts a lot with walking and sometimes hurts around to the back of her left hip but not usually to the front.  She says is been hurting significantly to the point where she cannot lay on that side of her hip any further and she has some right shoulder issues so she usually does not lay on that and says she has not been sleeping well because she hurts on one side on her shoulder and her hip on the other side.  She denies any fevers or chills or numbness or weakness.  She denies any redness or warmth.  Relevant past medical, surgical, family and social history reviewed and updated as indicated. Interim medical history since our last visit reviewed. Allergies and medications reviewed and updated.  Review of Systems  Constitutional: Negative for chills and fever.  Eyes: Negative for visual disturbance.  Respiratory: Negative for chest tightness and shortness of breath.   Cardiovascular: Negative for chest pain and leg swelling.  Musculoskeletal: Positive for arthralgias. Negative for back pain, gait problem and joint swelling.  Skin: Negative for rash.  Neurological: Negative for light-headedness and headaches.  Psychiatric/Behavioral: Negative for agitation and behavioral problems.  All other systems reviewed and are negative.   Per HPI unless specifically indicated  above   Allergies as of 03/07/2018      Reactions   Benadryl [diphenhydramine Hcl]    Nsaids Hives   Teriparatide Hives      Medication List        Accurate as of 03/07/18 10:28 AM. Always use your most recent med list.          ALAWAY 0.025 % ophthalmic solution Generic drug:  ketotifen Place 1 drop into both eyes 2 (two) times daily.   aluminum hydroxide-magnesium carbonate 95-358 MG/15ML Susp Commonly known as:  GAVISCON Take by mouth.   CALCIUM 500+D 500-200 MG-UNIT Tabs Generic drug:  Calcium Carb-Cholecalciferol Take 1 tablet by mouth daily.   co-enzyme Q-10 30 MG capsule Take 30 mg by mouth daily.   docusate sodium 100 MG capsule Commonly known as:  COLACE Take 100 mg by mouth daily as needed.   Fish Oil 1000 MG Caps Take by mouth.   hyoscyamine 0.375 MG 12 hr tablet Commonly known as:  LEVBID Take 1 tablet (0.375 mg total) by mouth 2 (two) times daily as needed.   hyoscyamine 0.125 MG SL tablet Commonly known as:  LEVSIN SL DISSOLVE ONE TABLET UNDER TONGUE EVERY EIGHT HOURS   Magnesium 300 MG Caps Take 400 mg by mouth 2 (two) times daily.   multivitamin capsule Take 1 capsule by mouth daily.   NASACORT ALLERGY 24HR NA Place 1 spray into  the nose daily.   nitroGLYCERIN 0.4 MG SL tablet Commonly known as:  NITROSTAT Place 1 tablet (0.4 mg total) under the tongue every 5 (five) minutes as needed for chest pain.   OVER THE COUNTER MEDICATION Take 400 mg by mouth 2 (two) times daily.   pravastatin 20 MG tablet Commonly known as:  PRAVACHOL Take 1 tablet (20 mg total) by mouth daily.   ranitidine 150 MG tablet Commonly known as:  ZANTAC Take 1 tablet (150 mg total) by mouth 2 (two) times daily.   SALINE NASAL SPRAY NA Place into the nose as directed.   SUPREP BOWEL PREP KIT 17.5-3.13-1.6 GM/177ML Soln Generic drug:  Na Sulfate-K Sulfate-Mg Sulf Take 1 kit by mouth as directed.   TURMERIC PO Take 1,000 mg by mouth 2 (two) times daily.    TYLENOL 500 MG tablet Generic drug:  acetaminophen Take 625 mg by mouth as needed.          Objective:    BP 125/67 (BP Location: Right Arm)   Pulse 78   Temp 97.7 F (36.5 C) (Oral)   Ht _0  (1.651 m)   Wt 150 lb (68 kg)   BMI 24.96 kg/m   Wt Readings from Last 3 Encounters:  03/07/18 150 lb (68 kg)  01/19/18 150 lb (68 kg)  01/19/18 150 lb (68 kg)    Physical Exam  Constitutional: She is oriented to person, place, and time. She appears well-developed and well-nourished. No distress.  Eyes: Pupils are equal, round, and reactive to light. Conjunctivae and EOM are normal.  Musculoskeletal: Normal range of motion. She exhibits tenderness (Left lateral hip tenderness over trochanteric bursa).  Neurological: She is alert and oriented to person, place, and time. Coordination normal.  Skin: Skin is warm and dry. No rash noted. She is not diaphoretic.  Psychiatric: She has a normal mood and affect. Her behavior is normal.  Nursing note and vitals reviewed.   Trochanteric bursitis injection: Consent form signed. Risk factors of bleeding and infection discussed with patient and patient is agreeable towards injection. Patient prepped with Betadine. Lateral approach towards injection used. Injected 80 mg of Depo-Medrol and 1 mL of 2% lidocaine. Patient tolerated procedure well and no side effects from noted. Minimal to no bleeding. Simple bandage applied after.     Assessment & Plan:   Problem List Items Addressed This Visit    None    Visit Diagnoses    Greater trochanteric bursitis of left hip    -  Primary   Relevant Medications   methylPREDNISolone acetate (DEPO-MEDROL) injection 80 mg (Start on 03/07/2018 10:30 AM)       Follow up plan: Return if symptoms worsen or fail to improve.  Counseling provided for all of the vaccine components No orders of the defined types were placed in this encounter.   Caryl Pina, MD [facility] 03/07/2018,  10:28 AM

## 2018-03-17 ENCOUNTER — Telehealth: Payer: Self-pay | Admitting: Gastroenterology

## 2018-03-17 ENCOUNTER — Other Ambulatory Visit: Payer: Self-pay

## 2018-03-17 ENCOUNTER — Encounter: Payer: Self-pay | Admitting: Gastroenterology

## 2018-03-17 ENCOUNTER — Ambulatory Visit (AMBULATORY_SURGERY_CENTER): Payer: Medicare HMO | Admitting: Gastroenterology

## 2018-03-17 VITALS — BP 127/77 | HR 66 | Temp 98.8°F | Resp 11 | Ht 60.0 in | Wt 150.0 lb

## 2018-03-17 DIAGNOSIS — K2100 Gastro-esophageal reflux disease with esophagitis, without bleeding: Secondary | ICD-10-CM

## 2018-03-17 DIAGNOSIS — K588 Other irritable bowel syndrome: Secondary | ICD-10-CM

## 2018-03-17 DIAGNOSIS — R1310 Dysphagia, unspecified: Secondary | ICD-10-CM

## 2018-03-17 DIAGNOSIS — K219 Gastro-esophageal reflux disease without esophagitis: Secondary | ICD-10-CM | POA: Diagnosis not present

## 2018-03-17 DIAGNOSIS — K222 Esophageal obstruction: Secondary | ICD-10-CM

## 2018-03-17 DIAGNOSIS — Z86010 Personal history of colonic polyps: Secondary | ICD-10-CM | POA: Diagnosis not present

## 2018-03-17 DIAGNOSIS — I2510 Atherosclerotic heart disease of native coronary artery without angina pectoris: Secondary | ICD-10-CM | POA: Diagnosis not present

## 2018-03-17 MED ORDER — HYOSCYAMINE SULFATE 0.125 MG SL SUBL: 0.1250 mg | SUBLINGUAL_TABLET | Freq: Four times a day (QID) | SUBLINGUAL | 1 refills | Status: DC | PRN

## 2018-03-17 MED ORDER — SODIUM CHLORIDE 0.9 % IV SOLN: 500.0000 mL | Freq: Once | INTRAVENOUS | Status: DC

## 2018-03-17 NOTE — Op Note (Signed)
[facility] Patient Name: [name] Procedure Date: 03/17/2018 8:34 AM MRN: [id number] Endoscopist: Ladene Artist , MD Age: 72 Referring MD:  Date of Birth: 1946-06-28 Gender: Female Account #: [id number] Procedure:                Upper GI endoscopy Indications:              Dysphagia Medicines:                Monitored Anesthesia Care Procedure:                Pre-Anesthesia Assessment:                           - Prior to the procedure, a History and Physical                            was performed, and patient medications and                            allergies were reviewed. The patient's tolerance of                            previous anesthesia was also reviewed. The risks                            and benefits of the procedure and the sedation                            options and risks were discussed with the patient.                            All questions were answered, and informed consent                            was obtained. Prior Anticoagulants: The patient has                            taken no previous anticoagulant or antiplatelet                            agents. ASA Grade Assessment: II - A patient with                            mild systemic disease. After reviewing the risks                            and benefits, the patient was deemed in                            satisfactory condition to undergo the procedure.                           After obtaining informed consent, the endoscope was  passed under direct vision. Throughout the                            procedure, the patient's blood pressure, pulse, and                            oxygen saturations were monitored continuously. The                            Endoscope was introduced through the mouth, and                            advanced to the second part of duodenum. The upper                            GI endoscopy was accomplished without  difficulty.                            The patient tolerated the procedure well. Scope In: Scope Out: Findings:                 One benign-appearing, intrinsic moderate stenosis                            was found at the gastroesophageal junction. This                            stenosis measured 1.2 cm (inner diameter). The                            stenosis was traversed. A guidewire was placed and                            the scope was withdrawn. Dilations were performed                            with Savary dilators with mild resistance at 13 mm,                            14 mm and 15 mm. No heme noted                           The exam of the esophagus was otherwise normal.                           A medium-sized hiatal hernia was present.                           A medium non-bleeding diverticulum was found in the                            gastric fundus in the [facility].  A few localized, medium non-bleeding linear                            erosions were found in the gastric fundus                            associated with the [facility]. There were no stigmata of                            recent bleeding.                           The exam of the stomach was otherwise normal.                           The duodenal bulb and second portion of the                            duodenum were normal. Complications:            No immediate complications. Estimated Blood Loss:     Estimated blood loss was minimal. Impression:               - Benign-appearing esophageal stenosis. Dilated.                           - Medium-sized hiatal hernia.                           - Gastric diverticulum.                           - Cameron erosions.                           - Normal duodenal bulb and second portion of the                            duodenum.                           - No specimens collected. Recommendation:           - Patient has a contact number available  for                            emergencies. The signs and symptoms of potential                            delayed complications were discussed with the                            patient. Return to normal activities tomorrow.                            Written discharge instructions were provided to the  patient.                           - Clear liquid diet for 2 hours, then advance as                            tolerated to soft diet today.                           - Resume prior diet tomorrow.                           - Follow antireflux measures.                           - Continue present medications.                           - Return to GI office in 6 weeks. Ladene Artist, MD 03/17/2018 9:12:41 AM This report has been signed electronically.

## 2018-03-17 NOTE — Telephone Encounter (Signed)
Informed patient that they are both hyoscyamine and Dr. Fuller Plan would want patient taking one or the other. Patient states one is SL. I informed patient that is correct but both hyoscyamine but different milligrams and should not both be taken at the same time. Patient states that's fine and she will clarify with Dr. Fuller Plan at her appt.

## 2018-03-17 NOTE — Progress Notes (Signed)
Called to room to assist during endoscopic procedure.  Patient ID and intended procedure confirmed with present staff. Received instructions for my participation in the procedure from the performing physician.  

## 2018-03-17 NOTE — Progress Notes (Signed)
History reviewed today 

## 2018-03-17 NOTE — Telephone Encounter (Signed)
[facility] in [place]

## 2018-03-17 NOTE — Patient Instructions (Signed)
Please read handouts on hemorrhoids, esophagitis, hiatal hernia, antireflux regimen.    YOU HAD AN ENDOSCOPIC PROCEDURE TODAY AT [facility]:   Refer to the procedure report that was given to you for any specific questions about what was found during the examination.  If the procedure report does not answer your questions, please call your gastroenterologist to clarify.  If you requested that your care partner not be given the details of your procedure findings, then the procedure report has been included in a sealed envelope for you to review at your convenience later.  YOU SHOULD EXPECT: Some feelings of bloating in the abdomen. Passage of more gas than usual.  Walking can help get rid of the air that was put into your GI tract during the procedure and reduce the bloating. If you had a lower endoscopy (such as a colonoscopy or flexible sigmoidoscopy) you may notice spotting of blood in your stool or on the toilet paper. If you underwent a bowel prep for your procedure, you may not have a normal bowel movement for a few days.  Please Note:  You might notice some irritation and congestion in your nose or some drainage.  This is from the oxygen used during your procedure.  There is no need for concern and it should clear up in a day or so.  SYMPTOMS TO REPORT IMMEDIATELY:   Following lower endoscopy (colonoscopy or flexible sigmoidoscopy):  Excessive amounts of blood in the stool  Significant tenderness or worsening of abdominal pains  Swelling of the abdomen that is new, acute  Fever of 100F or higher   Following upper endoscopy (EGD)  Vomiting of blood or coffee ground material  New chest pain or pain under the shoulder blades  Painful or persistently difficult swallowing  New shortness of breath  Fever of 100F or higher  Black, tarry-looking stools  For urgent or emergent issues, a gastroenterologist can be reached at any hour by calling [phone].   DIET     Drink plenty of fluids but you should avoid alcoholic beverages for 24 hours. Please follow Dilation diet today, clear liquids from 10 am to 12 noon, then soft diet rest of day. Resume regular diet tomorrow.  ACTIVITY:  You should plan to take it easy for the rest of today and you should NOT DRIVE or use heavy machinery until tomorrow (because of the sedation medicines used during the test).    FOLLOW UP: Our staff will call the number listed on your records the next business day following your procedure to check on you and address any questions or concerns that you may have regarding the information given to you following your procedure. If we do not reach you, we will leave a message.  However, if you are feeling well and you are not experiencing any problems, there is no need to return our call.  We will assume that you have returned to your regular daily activities without incident.  If any biopsies were taken you will be contacted by phone or by letter within the next 1-3 weeks.  Please call us at [phone] if you have not heard about the biopsies in 3 weeks.    SIGNATURES/CONFIDENTIALITY: You and/or your care partner have signed paperwork which will be entered into your electronic medical record.  These signatures attest to the fact that that the information above on your After Visit Summary has been reviewed and is understood.  Full responsibility of the confidentiality of this  discharge information lies with you and/or your care-partner. 

## 2018-03-17 NOTE — Progress Notes (Signed)
Report to PACU, RN, vss, BBS= Clear.  

## 2018-03-17 NOTE — Op Note (Signed)
[facility] Patient Name: [name] Procedure Date: 03/17/2018 8:34 AM MRN: [id number] Endoscopist: Ladene Artist , MD Age: 72 Referring MD:  Date of Birth: [birth year] Gender: Female Account #: [id number] Procedure:                Colonoscopy Indications:              Surveillance: Personal history of adenomatous                            polyps on last colonoscopy 5 years ago Medicines:                Monitored Anesthesia Care Procedure:                Pre-Anesthesia Assessment:                           - Prior to the procedure, a History and Physical                            was performed, and patient medications and                            allergies were reviewed. The patient's tolerance of                            previous anesthesia was also reviewed. The risks                            and benefits of the procedure and the sedation                            options and risks were discussed with the patient.                            All questions were answered, and informed consent                            was obtained. Prior Anticoagulants: The patient has                            taken no previous anticoagulant or antiplatelet                            agents. ASA Grade Assessment: II - A patient with                            mild systemic disease. After reviewing the risks                            and benefits, the patient was deemed in                            satisfactory condition to undergo the procedure.  After obtaining informed consent, the colonoscope                            was passed under direct vision. Throughout the                            procedure, the patient's blood pressure, pulse, and                            oxygen saturations were monitored continuously. The                            Colonoscope was introduced through the anus and                            advanced to the the cecum,  identified by                            appendiceal orifice and ileocecal valve. The                            ileocecal valve, appendiceal orifice, and rectum                            were photographed. The quality of the bowel                            preparation was good. The colonoscopy was performed                            without difficulty. The patient tolerated the                            procedure well. Scope In: 8:39:01 AM Scope Out: 8:54:00 AM Scope Withdrawal Time: 0 hours 11 minutes 52 seconds  Total Procedure Duration: 0 hours 14 minutes 59 seconds  Findings:                 The perianal and digital rectal examinations were                            normal.                           A single medium-sized localized angiodysplastic                            lesion without bleeding was found at the ileocecal                            valve.                           Internal hemorrhoids were found during  retroflexion. The hemorrhoids were medium-sized and                            Grade I (internal hemorrhoids that do not prolapse).                           The exam was otherwise without abnormality on                            direct and retroflexion views. Complications:            No immediate complications. Estimated blood loss:                            None. Estimated Blood Loss:     Estimated blood loss: none. 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                           - Internal hemorrhoids.                           - The examination was otherwise normal on direct                            and retroflexion views.                           - No specimens collected. Recommendation:           - Repeat colonoscopy in 5 years for surveillance.                           - Patient has a contact number available for                            emergencies. The signs and symptoms of potential                             delayed complications were discussed with the                            patient. Return to normal activities tomorrow.                            Written discharge instructions were provided to the                            patient.                           - Resume previous diet.                           - Continue present medications. Ladene Artist, MD 03/17/2018 9:07:01 AM This report has been signed electronically.

## 2018-03-20 ENCOUNTER — Telehealth: Payer: Self-pay

## 2018-03-20 NOTE — Telephone Encounter (Signed)
  Follow up Call-  Call back number 03/17/2018  Post procedure Call Back phone  # [phone]  Permission to leave phone message Yes  Some recent data might be hidden     LMOM.  Advised we will call again this afternoon. Freada [place]/Call-back [facility]

## 2018-03-20 NOTE — Telephone Encounter (Signed)
  Follow up Call-  Call back number 03/17/2018  Post procedure Call Back phone  # [phone]  Permission to leave phone message Yes  Some recent data might be hidden     Unavailable for [facility] phone call-back [place]/recovery

## 2018-04-07 DIAGNOSIS — Z1231 Encounter for screening mammogram for malignant neoplasm of breast: Secondary | ICD-10-CM | POA: Diagnosis not present

## 2018-04-07 DIAGNOSIS — Z803 Family history of malignant neoplasm of breast: Secondary | ICD-10-CM | POA: Diagnosis not present

## 2018-06-05 DIAGNOSIS — R69 Illness, unspecified: Secondary | ICD-10-CM | POA: Diagnosis not present

## 2018-06-06 ENCOUNTER — Ambulatory Visit: Payer: Medicare HMO | Admitting: Gastroenterology

## 2018-06-06 ENCOUNTER — Encounter: Payer: Self-pay | Admitting: Gastroenterology

## 2018-06-06 VITALS — BP 124/84 | HR 84 | Ht 59.25 in | Wt 152.2 lb

## 2018-06-06 DIAGNOSIS — K588 Other irritable bowel syndrome: Secondary | ICD-10-CM | POA: Diagnosis not present

## 2018-06-06 DIAGNOSIS — K219 Gastro-esophageal reflux disease without esophagitis: Secondary | ICD-10-CM

## 2018-06-06 DIAGNOSIS — Z86010 Personal history of colonic polyps: Secondary | ICD-10-CM | POA: Diagnosis not present

## 2018-06-06 MED ORDER — HYOSCYAMINE SULFATE ER 0.375 MG PO TB12: 0.3750 mg | ORAL_TABLET | Freq: Two times a day (BID) | ORAL | 11 refills | Status: DC | PRN

## 2018-06-06 MED ORDER — HYOSCYAMINE SULFATE 0.125 MG SL SUBL: 0.1250 mg | SUBLINGUAL_TABLET | Freq: Four times a day (QID) | SUBLINGUAL | 11 refills | Status: DC | PRN

## 2018-06-06 MED ORDER — RANITIDINE HCL 300 MG PO TABS: 300.0000 mg | ORAL_TABLET | Freq: Two times a day (BID) | ORAL | 11 refills | Status: DC

## 2018-06-06 NOTE — Patient Instructions (Signed)
We have sent the following medications to your pharmacy for you to pick up at your convenience: Zantac 300 mg twice daily, Levbid and Levsin.   Thank you for choosing me and [facility].  Pricilla Riffle. Dagoberto Ligas., MD., Marval Regal

## 2018-06-06 NOTE — Progress Notes (Signed)
    History of Present Illness: This is a 72 year old female returning for follow-up of GERD and an esophageal stricture recently dilated.  She notes substantial improvement in her dysphasia since dilation.  She relates occasional difficulty swallowing large pills and she localizes the symptoms to her neck.  She has occasional regurgitation following meals, particularly in her recliner.  She does not want to take a PPI.  She has long-term intermittent crampy abdominal pain that is relieved by Levbid or Levsin.  She has intermittent constipation and occasionally takes MiraLAX.  We reviewed the findings from her EGD.  EGD 03/2018 - Benign-appearing esophageal stenosis. Dilated. - Medium-sized hiatal hernia. - Gastric diverticulum. - Cameron erosions. - Normal duodenal bulb and second portion of the duodenum.  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 - Internal hemorrhoids. - The examination was otherwise normal on direct and retroflexion views.  Current Medications, Allergies, Past Medical History, Past Surgical History, Family History and Social History were reviewed in [employer] record.  Physical Exam: General: Well developed, well nourished, no acute distress Head: Normocephalic and atraumatic Eyes:  sclerae anicteric, EOMI Ears: Normal auditory acuity Mouth: No deformity or lesions Lungs: Clear throughout to auscultation Heart: Regular rate and rhythm; no murmurs, rubs or bruits Abdomen: Soft, non tender and non distended. No masses, hepatosplenomegaly or hernias noted. Normal Bowel sounds Rectal: Not done Musculoskeletal: Symmetrical with no gross deformities  Pulses:  Normal pulses noted Extremities: No clubbing, cyanosis, edema or deformities noted Neurological: Alert oriented x 4, grossly nonfocal Psychological:  Alert and cooperative. Normal mood and affect  Assessment and Recommendations:  1. GERD. Good control. History of esophageal  stricture.  Dysphagia to large pills is likely oropharyngeal.  We discussed the option of repeating endoscopy with repeat dilation however she would like to reserve this if her symptoms worsen.  Continue ranitidine 300 mg twice daily and closely follow antireflux measures.  She declines PPI therapy.  2. Personal history of adenomatous colon polyps.  5-year interval surveillance colonoscopy is recommended in June 2024  3. Colonic AVM.   4. IBS.  Levbid 1 twice daily as needed.  Hyoscyamine 1-2 every 4 hours as needed.

## 2018-06-14 DIAGNOSIS — M12571 Traumatic arthropathy, right ankle and foot: Secondary | ICD-10-CM | POA: Diagnosis not present

## 2018-06-14 DIAGNOSIS — M25571 Pain in right ankle and joints of right foot: Secondary | ICD-10-CM | POA: Diagnosis not present

## 2018-06-22 DIAGNOSIS — R69 Illness, unspecified: Secondary | ICD-10-CM | POA: Diagnosis not present

## 2018-07-21 DIAGNOSIS — R69 Illness, unspecified: Secondary | ICD-10-CM | POA: Diagnosis not present

## 2018-08-08 ENCOUNTER — Ambulatory Visit: Payer: Medicare HMO

## 2018-10-16 ENCOUNTER — Other Ambulatory Visit: Payer: Self-pay | Admitting: Gastroenterology

## 2018-10-16 DIAGNOSIS — K588 Other irritable bowel syndrome: Secondary | ICD-10-CM

## 2018-10-23 ENCOUNTER — Other Ambulatory Visit: Payer: Self-pay

## 2018-10-23 DIAGNOSIS — K588 Other irritable bowel syndrome: Secondary | ICD-10-CM

## 2018-10-23 MED ORDER — HYOSCYAMINE SULFATE 0.125 MG SL SUBL: SUBLINGUAL_TABLET | SUBLINGUAL | 1 refills | Status: DC

## 2018-11-02 DIAGNOSIS — D2271 Melanocytic nevi of right lower limb, including hip: Secondary | ICD-10-CM | POA: Diagnosis not present

## 2018-11-02 DIAGNOSIS — D1801 Hemangioma of skin and subcutaneous tissue: Secondary | ICD-10-CM | POA: Diagnosis not present

## 2018-11-02 DIAGNOSIS — D225 Melanocytic nevi of trunk: Secondary | ICD-10-CM | POA: Diagnosis not present

## 2018-11-02 DIAGNOSIS — L821 Other seborrheic keratosis: Secondary | ICD-10-CM | POA: Diagnosis not present

## 2018-11-02 DIAGNOSIS — L812 Freckles: Secondary | ICD-10-CM | POA: Diagnosis not present

## 2018-11-02 DIAGNOSIS — D2239 Melanocytic nevi of other parts of face: Secondary | ICD-10-CM | POA: Diagnosis not present

## 2018-11-02 DIAGNOSIS — L820 Inflamed seborrheic keratosis: Secondary | ICD-10-CM | POA: Diagnosis not present

## 2018-11-02 DIAGNOSIS — L720 Epidermal cyst: Secondary | ICD-10-CM | POA: Diagnosis not present

## 2018-11-02 DIAGNOSIS — D485 Neoplasm of uncertain behavior of skin: Secondary | ICD-10-CM | POA: Diagnosis not present

## 2018-11-02 DIAGNOSIS — D2261 Melanocytic nevi of right upper limb, including shoulder: Secondary | ICD-10-CM | POA: Diagnosis not present

## 2018-12-13 DIAGNOSIS — R6889 Other general symptoms and signs: Secondary | ICD-10-CM | POA: Diagnosis not present

## 2019-02-16 ENCOUNTER — Other Ambulatory Visit: Payer: Self-pay

## 2019-02-19 ENCOUNTER — Ambulatory Visit (INDEPENDENT_AMBULATORY_CARE_PROVIDER_SITE_OTHER): Payer: Medicare Other | Admitting: Family Medicine

## 2019-02-19 ENCOUNTER — Encounter: Payer: Self-pay | Admitting: Family Medicine

## 2019-02-19 ENCOUNTER — Other Ambulatory Visit: Payer: Self-pay

## 2019-02-19 VITALS — BP 139/79 | HR 73 | Temp 97.8°F | Ht 59.25 in | Wt 150.6 lb

## 2019-02-19 DIAGNOSIS — M810 Age-related osteoporosis without current pathological fracture: Secondary | ICD-10-CM

## 2019-02-19 DIAGNOSIS — E782 Mixed hyperlipidemia: Secondary | ICD-10-CM

## 2019-02-19 DIAGNOSIS — R202 Paresthesia of skin: Secondary | ICD-10-CM

## 2019-02-19 DIAGNOSIS — I10 Essential (primary) hypertension: Secondary | ICD-10-CM | POA: Diagnosis not present

## 2019-02-19 DIAGNOSIS — K219 Gastro-esophageal reflux disease without esophagitis: Secondary | ICD-10-CM | POA: Diagnosis not present

## 2019-02-19 DIAGNOSIS — Z131 Encounter for screening for diabetes mellitus: Secondary | ICD-10-CM | POA: Diagnosis not present

## 2019-02-19 LAB — BAYER DCA HB A1C WAIVED: HB A1C (BAYER DCA - WAIVED): 5.7 % (ref <7.0)

## 2019-02-19 NOTE — Progress Notes (Signed)
BP 139/79    Pulse 73    Temp 97.8 F (36.6 C) (Oral)    Ht 4' 11.25" (1.505 m)    Wt 150 lb 9.6 oz (68.3 kg)    BMI 30.16 kg/m    Subjective:   Patient ID: [name], female    DOB: [birth year], 73 y.o.   MRN: [id number]  HPI: [name] is a 73 y.o. female presenting on 02/19/2019 for Diabetes (Patient states she has been having trouble sleeping and dizzines.) and Abdominal Pain (Patient states has been ongoing but has gotten worse x 3 months.)   HPI Hypertension Patient is currently on no medications currently, and their blood pressure today is 139/79. Patient denies any lightheadedness or dizziness. Patient denies headaches, blurred vision, chest pains, shortness of breath, or weakness. Denies any side effects from medication and is content with current medication.   Hyperlipidemia Patient is coming in for recheck of his hyperlipidemia. The patient is currently taking had stopped her pravastatin at least 6 months ago because of arthralgias. They deny any issues with myalgias or history of liver damage from it. They deny any focal numbness or weakness or chest pain.   GERD Patient is currently on stopped her medication at least 6 months ago but does have a history of hiatal hernia and is currently complaining of intermittent upper abdominal discomfort without nausea or vomiting.   She denies any blood in her stool.  She does have some intermittent lightheadedness as well  Intermittent tingling and dizziness and dry mouth Patient complains of for the past few months that she has had some intermittent tingling and intermittent dizziness and intermittent dry mouth that will happen occasionally and then will go away quickly and usually does not last more than a few minutes to maybe 20 minutes and has been happening once every couple weeks but she is concerned because she has an extensive family history of diabetes and wonders if these signs could be related to that.  She also complains  of some upper abdominal pain which she does have a history of a hiatal hernia and does not know if it is that or if it something related to possible diabetes.  She says she has been more sedentary because of the coronavirus and cannot get up to the gym like she used to.  She would like to have some blood work tested for these things per  Relevant past medical, surgical, family and social history reviewed and updated as indicated. Interim medical history since our last visit reviewed. Allergies and medications reviewed and updated.  Review of Systems  Constitutional: Negative for chills and fever.  Eyes: Negative for redness and visual disturbance.  Respiratory: Negative for chest tightness and shortness of breath.   Cardiovascular: Negative for chest pain and leg swelling.  Gastrointestinal: Positive for abdominal pain. Negative for blood in stool, constipation, diarrhea, nausea and vomiting.  Musculoskeletal: Negative for back pain and gait problem.  Skin: Negative for rash.  Neurological: Negative for light-headedness and headaches.  Psychiatric/Behavioral: Negative for agitation and behavioral problems.  All other systems reviewed and are negative.   Per HPI unless specifically indicated above   Allergies as of 02/19/2019      Reactions   Benadryl [diphenhydramine Hcl]    Nsaids Hives   Teriparatide Hives      Medication List       Accurate as of Feb 19, 2019  8:18 AM. If you have any questions, ask your  BP 139/79    Pulse 73    Temp 97.8 F (36.6 C) (Oral)    Ht 4' 11.25" (1.505 m)    Wt 150 lb 9.6 oz (68.3 kg)    BMI 30.16 kg/m    Subjective:   Patient ID: [name], female    DOB: [birth year], 73 y.o.   MRN: [id number]  HPI: [name] is a 73 y.o. female presenting on 02/19/2019 for Diabetes (Patient states she has been having trouble sleeping and dizzines.) and Abdominal Pain (Patient states has been ongoing but has gotten worse x 3 months.)   HPI Hypertension Patient is currently on no medications currently, and their blood pressure today is 139/79. Patient denies any lightheadedness or dizziness. Patient denies headaches, blurred vision, chest pains, shortness of breath, or weakness. Denies any side effects from medication and is content with current medication.   Hyperlipidemia Patient is coming in for recheck of his hyperlipidemia. The patient is currently taking had stopped her pravastatin at least 6 months ago because of arthralgias. They deny any issues with myalgias or history of liver damage from it. They deny any focal numbness or weakness or chest pain.   GERD Patient is currently on stopped her medication at least 6 months ago but does have a history of hiatal hernia and is currently complaining of intermittent upper abdominal discomfort without nausea or vomiting.   She denies any blood in her stool.  She does have some intermittent lightheadedness as well  Intermittent tingling and dizziness and dry mouth Patient complains of for the past few months that she has had some intermittent tingling and intermittent dizziness and intermittent dry mouth that will happen occasionally and then will go away quickly and usually does not last more than a few minutes to maybe 20 minutes and has been happening once every couple weeks but she is concerned because she has an extensive family history of diabetes and wonders if these signs could be related to that.  She also complains  of some upper abdominal pain which she does have a history of a hiatal hernia and does not know if it is that or if it something related to possible diabetes.  She says she has been more sedentary because of the coronavirus and cannot get up to the gym like she used to.  She would like to have some blood work tested for these things per  Relevant past medical, surgical, family and social history reviewed and updated as indicated. Interim medical history since our last visit reviewed. Allergies and medications reviewed and updated.  Review of Systems  Constitutional: Negative for chills and fever.  Eyes: Negative for redness and visual disturbance.  Respiratory: Negative for chest tightness and shortness of breath.   Cardiovascular: Negative for chest pain and leg swelling.  Gastrointestinal: Positive for abdominal pain. Negative for blood in stool, constipation, diarrhea, nausea and vomiting.  Musculoskeletal: Negative for back pain and gait problem.  Skin: Negative for rash.  Neurological: Negative for light-headedness and headaches.  Psychiatric/Behavioral: Negative for agitation and behavioral problems.  All other systems reviewed and are negative.   Per HPI unless specifically indicated above   Allergies as of 02/19/2019      Reactions   Benadryl [diphenhydramine Hcl]    Nsaids Hives   Teriparatide Hives      Medication List       Accurate as of Feb 19, 2019  8:18 AM. If you have any questions, ask your  ° °BP 139/79    Pulse 73    Temp 97.8 °F (36.6 °C) (Oral)    Ht 4' 11.25" (1.505 m)    Wt 150 lb 9.6 oz (68.3 kg)    BMI 30.16 kg/m²   ° °Subjective:  ° °Patient ID: [name], female    DOB: 07/10/1946, 72 y.o.   MRN: [id number] ° °HPI: °[name] is a 72 y.o. female presenting on 02/19/2019 for Diabetes (Patient states she has been having trouble sleeping and dizzines.) and Abdominal Pain (Patient states has been ongoing but has gotten worse x 3 months.) ° ° °HPI °Hypertension °Patient is currently on no medications currently, and their blood pressure today is 139/79. Patient denies any lightheadedness or dizziness. Patient denies headaches, blurred vision, chest pains, shortness of breath, or weakness. Denies any side effects from medication and is content with current medication.  ° °Hyperlipidemia °Patient is coming in for recheck of his hyperlipidemia. The patient is currently taking had stopped her pravastatin at least 6 months ago because of arthralgias. They deny any issues with myalgias or history of liver damage from it. They deny any focal numbness or weakness or chest pain.  ° °GERD °Patient is currently on stopped her medication at least 6 months ago but does have a history of hiatal hernia and is currently complaining of intermittent upper abdominal discomfort without nausea or vomiting.   She denies any blood in her stool.  She does have some intermittent lightheadedness as well ° °Intermittent tingling and dizziness and dry mouth °Patient complains of for the past few months that she has had some intermittent tingling and intermittent dizziness and intermittent dry mouth that will happen occasionally and then will go away quickly and usually does not last more than a few minutes to maybe 20 minutes and has been happening once every couple weeks but she is concerned because she has an extensive family history of diabetes and wonders if these signs could be related to that.  She also complains  of some upper abdominal pain which she does have a history of a hiatal hernia and does not know if it is that or if it something related to possible diabetes.  She says she has been more sedentary because of the coronavirus and cannot get up to the gym like she used to.  She would like to have some blood work tested for these things per ° °Relevant past medical, surgical, family and social history reviewed and updated as indicated. Interim medical history since our last visit reviewed. °Allergies and medications reviewed and updated. ° °Review of Systems  °Constitutional: Negative for chills and fever.  °Eyes: Negative for redness and visual disturbance.  °Respiratory: Negative for chest tightness and shortness of breath.   °Cardiovascular: Negative for chest pain and leg swelling.  °Gastrointestinal: Positive for abdominal pain. Negative for blood in stool, constipation, diarrhea, nausea and vomiting.  °Musculoskeletal: Negative for back pain and gait problem.  °Skin: Negative for rash.  °Neurological: Negative for light-headedness and headaches.  °Psychiatric/Behavioral: Negative for agitation and behavioral problems.  °All other systems reviewed and are negative. ° ° °Per HPI unless specifically indicated above ° ° °Allergies as of 02/19/2019   °   Reactions  ° Benadryl [diphenhydramine Hcl]   ° Nsaids Hives  ° Teriparatide Hives  °  °  °Medication List  °  °  ° Accurate as of Feb 19, 2019  8:18 AM. If you have any questions, ask your nurse

## 2019-02-20 LAB — CBC WITH DIFFERENTIAL/PLATELET
Basophils Absolute: 0.1 10*3/uL (ref 0.0–0.2)
Basos: 1 %
EOS (ABSOLUTE): 0.1 10*3/uL (ref 0.0–0.4)
Eos: 1 %
Hematocrit: 40.5 % (ref 34.0–46.6)
Hemoglobin: 13.6 g/dL (ref 11.1–15.9)
Immature Grans (Abs): 0 10*3/uL (ref 0.0–0.1)
Immature Granulocytes: 0 %
Lymphocytes Absolute: 2.3 10*3/uL (ref 0.7–3.1)
Lymphs: 40 %
MCH: 26.8 pg (ref 26.6–33.0)
MCHC: 33.6 g/dL (ref 31.5–35.7)
MCV: 80 fL (ref 79–97)
Monocytes Absolute: 0.6 10*3/uL (ref 0.1–0.9)
Monocytes: 10 %
Neutrophils Absolute: 2.8 10*3/uL (ref 1.4–7.0)
Neutrophils: 48 %
Platelets: 249 10*3/uL (ref 150–450)
RBC: 5.08 x10E6/uL (ref 3.77–5.28)
RDW: 14.2 % (ref 11.7–15.4)
WBC: 5.8 10*3/uL (ref 3.4–10.8)

## 2019-02-20 LAB — LIPID PANEL
Chol/HDL Ratio: 4 ratio (ref 0.0–4.4)
Cholesterol, Total: 255 mg/dL — ABNORMAL HIGH (ref 100–199)
HDL: 64 mg/dL (ref >39)
LDL Calculated: 157 mg/dL — ABNORMAL HIGH (ref 0–99)
Triglycerides: 172 mg/dL — ABNORMAL HIGH (ref 0–149)
VLDL Cholesterol Cal: 34 mg/dL (ref 5–40)

## 2019-02-20 LAB — CMP14+EGFR
ALT: 16 IU/L (ref 0–32)
AST: 17 IU/L (ref 0–40)
Albumin/Globulin Ratio: 2 (ref 1.2–2.2)
Albumin: 4.5 g/dL (ref 3.7–4.7)
Alkaline Phosphatase: 64 IU/L (ref 39–117)
BUN/Creatinine Ratio: 21 (ref 12–28)
BUN: 18 mg/dL (ref 8–27)
Bilirubin Total: 0.4 mg/dL (ref 0.0–1.2)
CO2: 23 mmol/L (ref 20–29)
Calcium: 9.9 mg/dL (ref 8.7–10.3)
Chloride: 105 mmol/L (ref 96–106)
Creatinine, Ser: 0.86 mg/dL (ref 0.57–1.00)
GFR calc Af Amer: 79 mL/min/{1.73_m2} (ref >59)
GFR calc non Af Amer: 68 mL/min/{1.73_m2} (ref >59)
Globulin, Total: 2.2 g/dL (ref 1.5–4.5)
Glucose: 94 mg/dL (ref 65–99)
Potassium: 4.5 mmol/L (ref 3.5–5.2)
Sodium: 141 mmol/L (ref 134–144)
Total Protein: 6.7 g/dL (ref 6.0–8.5)

## 2019-02-20 LAB — VITAMIN D 25 HYDROXY (VIT D DEFICIENCY, FRACTURES): Vit D, 25-Hydroxy: 63.9 ng/mL (ref 30.0–100.0)

## 2019-02-20 LAB — TSH: TSH: 5.69 u[IU]/mL — ABNORMAL HIGH (ref 0.450–4.500)

## 2019-02-20 LAB — VITAMIN B12: Vitamin B-12: 748 pg/mL (ref 232–1245)

## 2019-02-22 ENCOUNTER — Other Ambulatory Visit: Payer: Self-pay | Admitting: *Deleted

## 2019-02-22 DIAGNOSIS — R7989 Other specified abnormal findings of blood chemistry: Secondary | ICD-10-CM

## 2019-02-22 MED ORDER — ATORVASTATIN CALCIUM 20 MG PO TABS: 20.0000 mg | ORAL_TABLET | Freq: Every day | ORAL | 0 refills | Status: DC

## 2019-04-03 DIAGNOSIS — M79672 Pain in left foot: Secondary | ICD-10-CM | POA: Diagnosis not present

## 2019-04-03 DIAGNOSIS — M7741 Metatarsalgia, right foot: Secondary | ICD-10-CM | POA: Diagnosis not present

## 2019-04-03 DIAGNOSIS — M79671 Pain in right foot: Secondary | ICD-10-CM | POA: Diagnosis not present

## 2019-04-03 DIAGNOSIS — M7742 Metatarsalgia, left foot: Secondary | ICD-10-CM | POA: Diagnosis not present

## 2019-04-09 DIAGNOSIS — Z1231 Encounter for screening mammogram for malignant neoplasm of breast: Secondary | ICD-10-CM | POA: Diagnosis not present

## 2019-04-09 DIAGNOSIS — Z803 Family history of malignant neoplasm of breast: Secondary | ICD-10-CM | POA: Diagnosis not present

## 2019-05-04 ENCOUNTER — Ambulatory Visit (INDEPENDENT_AMBULATORY_CARE_PROVIDER_SITE_OTHER): Payer: Medicare Other | Admitting: Family Medicine

## 2019-05-04 ENCOUNTER — Encounter: Payer: Self-pay | Admitting: Family Medicine

## 2019-05-04 ENCOUNTER — Other Ambulatory Visit: Payer: Self-pay

## 2019-05-04 VITALS — BP 125/68 | HR 103 | Temp 98.2°F | Ht 59.25 in | Wt 152.8 lb

## 2019-05-04 DIAGNOSIS — S1086XA Insect bite of other specified part of neck, initial encounter: Secondary | ICD-10-CM

## 2019-05-04 DIAGNOSIS — W57XXXA Bitten or stung by nonvenomous insect and other nonvenomous arthropods, initial encounter: Secondary | ICD-10-CM

## 2019-05-04 NOTE — Progress Notes (Signed)
BP 125/68   Pulse (!) 103   Temp 98.2 F (36.8 C) (Oral)   Ht 4' 11.25" (1.505 m)   Wt 152 lb 12.8 oz (69.3 kg)   BMI 30.60 kg/m    Subjective:   Patient ID: [name], female    DOB: 01/13/1946, 73 y.o.   MRN: [id number]  HPI: [name] is a 73 y.o. female presenting on 05/04/2019 for Insect Bite   HPI Patient is coming in today for couple insect bites of the back of her neck that she is been concerned about.  She noticed 1- 5 days ago and then another 3 days ago and they were inflamed and more red.  She used some cortisone on it but it did not seem to be helping and then she is used some topical Neosporin on it and it seems to be improving now.  She says they are improving but she does want to get them looked at to make sure they were not a spider bite or anything else that she need to be worried about.  Relevant past medical, surgical, family and social history reviewed and updated as indicated. Interim medical history since our last visit reviewed. Allergies and medications reviewed and updated.  Review of Systems  Constitutional: Negative for chills and fever.  Eyes: Negative for visual disturbance.  Skin: Positive for rash.  Neurological: Negative for light-headedness and headaches.  Psychiatric/Behavioral: Negative for agitation and behavioral problems.  All other systems reviewed and are negative.   Per HPI unless specifically indicated above   Allergies as of 05/04/2019      Reactions   Benadryl [diphenhydramine Hcl]    Neomycin-polymyxin B Gu Other (See Comments)   unknown   Nsaids Hives   Teriparatide Hives      Medication List       Accurate as of May 04, 2019  3:53 PM. If you have any questions, ask your nurse or doctor.        STOP taking these medications   TURMERIC PO Stopped by: Fransisca Kaufmann Dettinger, MD     TAKE these medications   Alaway 0.025 % ophthalmic solution Generic drug: ketotifen Place 1 drop into both eyes 2 (two) times  daily.   aluminum hydroxide-magnesium carbonate 95-358 MG/15ML Susp Commonly known as: GAVISCON Take by mouth.   atorvastatin 20 MG tablet Commonly known as: LIPITOR Take 1 tablet (20 mg total) by mouth at bedtime.   co-enzyme Q-10 30 MG capsule Take 30 mg by mouth daily.   Fish Oil 1000 MG Caps Take by mouth.   hyoscyamine 0.375 MG 12 hr tablet Commonly known as: LEVBID Take 1 tablet (0.375 mg total) by mouth 2 (two) times daily as needed.   hyoscyamine 0.125 MG SL tablet Commonly known as: LEVSIN SL DISSOLVE 1 TABLET IN MOUTH EVERY 8 HOURS AS NEEDED.   Magnesium 300 MG Caps Take 400 mg by mouth 2 (two) times daily.   multivitamin capsule Take 1 capsule by mouth daily.   NASACORT ALLERGY 24HR NA Place 1 spray into the nose daily.   SALINE NASAL SPRAY NA Place into the nose as directed.   TYLENOL 500 MG tablet Generic drug: acetaminophen Take 625 mg by mouth as needed.        Objective:   BP 125/68   Pulse (!) 103   Temp 98.2 F (36.8 C) (Oral)   Ht 4' 11.25" (1.505 m)   Wt 152 lb 12.8 oz (69.3 kg)  BMI 30.60 kg/m   Wt Readings from Last 3 Encounters:  05/04/19 152 lb 12.8 oz (69.3 kg)  02/19/19 150 lb 9.6 oz (68.3 kg)  06/06/18 152 lb 4 oz (69.1 kg)    Physical Exam Vitals signs and nursing note reviewed.  Constitutional:      General: She is not in acute distress.    Appearance: She is well-developed. She is not diaphoretic.  Eyes:     Conjunctiva/sclera: Conjunctivae normal.  Skin:    General: Skin is warm and dry.     Findings: Rash (2 small fine papules with a mild amount of erythema around them, 1 at the nape of her neck and one on her right lateral neck, both are very small and per her description seem to be improving) present.  Neurological:     Mental Status: She is alert and oriented to person, place, and time.     Coordination: Coordination normal.  Psychiatric:        Behavior: Behavior normal.       Assessment & Plan:    Problem List Items Addressed This Visit    None    Visit Diagnoses    Arthropod bite, initial encounter    -  Primary      Continue with Neosporin Follow up plan: Return if symptoms worsen or fail to improve.  Counseling provided for all of the vaccine components No orders of the defined types were placed in this encounter.   Caryl Pina, MD [facility] 05/04/2019, 3:53 PM

## 2019-06-04 ENCOUNTER — Other Ambulatory Visit: Payer: Self-pay | Admitting: Family Medicine

## 2019-06-25 ENCOUNTER — Other Ambulatory Visit: Payer: Self-pay

## 2019-06-26 ENCOUNTER — Ambulatory Visit (INDEPENDENT_AMBULATORY_CARE_PROVIDER_SITE_OTHER): Payer: Medicare Other

## 2019-06-26 DIAGNOSIS — Z23 Encounter for immunization: Secondary | ICD-10-CM | POA: Diagnosis not present

## 2019-07-18 ENCOUNTER — Telehealth: Payer: Self-pay | Admitting: Family Medicine

## 2019-07-18 DIAGNOSIS — Z13820 Encounter for screening for osteoporosis: Secondary | ICD-10-CM

## 2019-07-18 NOTE — Telephone Encounter (Signed)
Can we do a referral for this? Please advise

## 2019-07-18 NOTE — Telephone Encounter (Signed)
Yes go ahead and do a referral and put in an order for this, I am good with her going to do this, diagnosis osteoporosis screening

## 2019-07-19 NOTE — Telephone Encounter (Signed)
Referral placed patient aware  

## 2019-07-25 ENCOUNTER — Other Ambulatory Visit: Payer: Self-pay

## 2019-07-25 DIAGNOSIS — Z96653 Presence of artificial knee joint, bilateral: Secondary | ICD-10-CM | POA: Diagnosis not present

## 2019-07-25 DIAGNOSIS — M810 Age-related osteoporosis without current pathological fracture: Secondary | ICD-10-CM | POA: Diagnosis not present

## 2019-07-26 ENCOUNTER — Ambulatory Visit (INDEPENDENT_AMBULATORY_CARE_PROVIDER_SITE_OTHER): Payer: Medicare Other | Admitting: Family Medicine

## 2019-07-26 ENCOUNTER — Encounter: Payer: Self-pay | Admitting: Family Medicine

## 2019-07-26 VITALS — BP 116/73 | HR 94 | Temp 97.8°F | Ht 59.25 in | Wt 150.8 lb

## 2019-07-26 DIAGNOSIS — M130 Polyarthritis, unspecified: Secondary | ICD-10-CM | POA: Diagnosis not present

## 2019-07-26 DIAGNOSIS — E782 Mixed hyperlipidemia: Secondary | ICD-10-CM | POA: Diagnosis not present

## 2019-07-26 DIAGNOSIS — K219 Gastro-esophageal reflux disease without esophagitis: Secondary | ICD-10-CM | POA: Diagnosis not present

## 2019-07-26 DIAGNOSIS — I10 Essential (primary) hypertension: Secondary | ICD-10-CM

## 2019-07-26 NOTE — Progress Notes (Signed)
BP 116/73   Pulse 94   Temp 97.8 F (36.6 C) (Temporal)   Ht 4' 11.25" (1.505 m)   Wt 150 lb 12.8 oz (68.4 kg)   SpO2 96%   BMI 30.20 kg/m    Subjective:   Patient ID: [name], female    DOB: [birth year], 73 y.o.   MRN: [id number]  HPI: [name] is a 73 y.o. female presenting on 07/26/2019 for Hypertension (3 month follow up- Patient states she has been having ongoing joint paij) and Temporomandibular Joint Pain   HPI Hypertension Patient is currently on no medication we are monitoring, and their blood pressure today is 116/73. Patient denies any lightheadedness or dizziness. Patient denies headaches, blurred vision, chest pains, shortness of breath, or weakness. Denies any side effects from medication and is content with current medication.   Hyperlipidemia Patient is coming in for recheck of his hyperlipidemia. The patient is currently taking no medication and stopped the atorvastatin because of body aches. They deny any issues with myalgias or history of liver damage from it. They deny any focal numbness or weakness or chest pain.   GERD Patient is currently on over-the-counter occasional medication but nothing consistent.  She denies any major symptoms or abdominal pain or belching or burping. She denies any blood in her stool or lightheadedness or dizziness.   Relevant past medical, surgical, family and social history reviewed and updated as indicated. Interim medical history since our last visit reviewed. Allergies and medications reviewed and updated.  Review of Systems  Constitutional: Negative for chills and fever.  Eyes: Negative for visual disturbance.  Respiratory: Negative for chest tightness and shortness of breath.   Cardiovascular: Negative for chest pain and leg swelling.  Musculoskeletal: Positive for arthralgias (Wrists and hands, arthritis). Negative for back pain and gait problem.  Skin: Negative for rash.  Neurological: Negative for  light-headedness and headaches.  Psychiatric/Behavioral: Negative for agitation and behavioral problems.  All other systems reviewed and are negative.   Per HPI unless specifically indicated above   Allergies as of 07/26/2019      Reactions   Benadryl [diphenhydramine Hcl]    Neomycin-polymyxin B Gu Other (See Comments)   unknown   Nsaids Hives   Teriparatide Hives      Medication List       Accurate as of July 26, 2019  2:58 PM. If you have any questions, ask your nurse or doctor.        STOP taking these medications   atorvastatin 20 MG tablet Commonly known as: LIPITOR Stopped by: Fransisca Kaufmann Dettinger, MD     TAKE these medications   Alaway 0.025 % ophthalmic solution Generic drug: ketotifen Place 1 drop into both eyes 2 (two) times daily.   aluminum hydroxide-magnesium carbonate 95-358 MG/15ML Susp Commonly known as: GAVISCON Take by mouth.   co-enzyme Q-10 30 MG capsule Take 30 mg by mouth daily.   Fish Oil 1000 MG Caps Take by mouth.   hyoscyamine 0.375 MG 12 hr tablet Commonly known as: LEVBID Take 1 tablet (0.375 mg total) by mouth 2 (two) times daily as needed.   hyoscyamine 0.125 MG SL tablet Commonly known as: LEVSIN SL DISSOLVE 1 TABLET IN MOUTH EVERY 8 HOURS AS NEEDED.   Magnesium 300 MG Caps Take 400 mg by mouth 2 (two) times daily.   multivitamin capsule Take 1 capsule by mouth daily.   NASACORT ALLERGY 24HR NA Place 1 spray into the nose daily.  SALINE NASAL SPRAY NA Place into the nose as directed.   TYLENOL 500 MG tablet Generic drug: acetaminophen Take 625 mg by mouth as needed.        Objective:   BP 116/73   Pulse 94   Temp 97.8 F (36.6 C) (Temporal)   Ht 4' 11.25" (1.505 m)   Wt 150 lb 12.8 oz (68.4 kg)   SpO2 96%   BMI 30.20 kg/m   Wt Readings from Last 3 Encounters:  07/26/19 150 lb 12.8 oz (68.4 kg)  05/04/19 152 lb 12.8 oz (69.3 kg)  02/19/19 150 lb 9.6 oz (68.3 kg)    Physical Exam Vitals signs and  nursing note reviewed.  Constitutional:      General: She is not in acute distress.    Appearance: She is well-developed. She is not diaphoretic.  Eyes:     Conjunctiva/sclera: Conjunctivae normal.  Cardiovascular:     Rate and Rhythm: Normal rate and regular rhythm.     Heart sounds: Normal heart sounds. No murmur.  Pulmonary:     Effort: Pulmonary effort is normal. No respiratory distress.     Breath sounds: Normal breath sounds. No wheezing.  Musculoskeletal: Normal range of motion.        General: No tenderness.  Skin:    General: Skin is warm and dry.     Findings: No rash.  Neurological:     Mental Status: She is alert and oriented to person, place, and time.     Coordination: Coordination normal.  Psychiatric:        Behavior: Behavior normal.       Assessment & Plan:   Problem List Items Addressed This Visit      Cardiovascular and Mediastinum   Hypertension   Relevant Orders   CMP14+EGFR     Digestive   GERD (gastroesophageal reflux disease)   Relevant Orders   CBC with Differential/Platelet     Other   HLD (hyperlipidemia) - Primary   Relevant Orders   Lipid panel    Other Visit Diagnoses    Polyarthritis of multiple sites       Relevant Orders   Arthritis Panel      Patient stopped her atorvastatin, will check cholesterol again today but may consider doing pravastatin Follow up plan: Return in about 6 months (around 01/24/2020), or if symptoms worsen or fail to improve, for htn and hld and gerd.  Counseling provided for all of the vaccine components Orders Placed This Encounter  Procedures  . CBC with Differential/Platelet  . CMP14+EGFR  . Lipid panel  . Arthritis Panel    Caryl Pina, MD [facility] 07/26/2019, 2:58 PM

## 2019-07-27 LAB — CMP14+EGFR
ALT: 18 IU/L (ref 0–32)
AST: 13 IU/L (ref 0–40)
Albumin/Globulin Ratio: 2.2 (ref 1.2–2.2)
Albumin: 4.6 g/dL (ref 3.7–4.7)
Alkaline Phosphatase: 66 IU/L (ref 39–117)
BUN/Creatinine Ratio: 16 (ref 12–28)
BUN: 13 mg/dL (ref 8–27)
Bilirubin Total: 0.2 mg/dL (ref 0.0–1.2)
CO2: 23 mmol/L (ref 20–29)
Calcium: 9.6 mg/dL (ref 8.7–10.3)
Chloride: 106 mmol/L (ref 96–106)
Creatinine, Ser: 0.83 mg/dL (ref 0.57–1.00)
GFR calc Af Amer: 81 mL/min/{1.73_m2} (ref >59)
GFR calc non Af Amer: 71 mL/min/{1.73_m2} (ref >59)
Globulin, Total: 2.1 g/dL (ref 1.5–4.5)
Glucose: 107 mg/dL — ABNORMAL HIGH (ref 65–99)
Potassium: 4.2 mmol/L (ref 3.5–5.2)
Sodium: 142 mmol/L (ref 134–144)
Total Protein: 6.7 g/dL (ref 6.0–8.5)

## 2019-07-27 LAB — ARTHRITIS PANEL
Basophils Absolute: 0 10*3/uL (ref 0.0–0.2)
Basos: 1 %
EOS (ABSOLUTE): 0.1 10*3/uL (ref 0.0–0.4)
Eos: 2 %
Hematocrit: 43.4 % (ref 34.0–46.6)
Hemoglobin: 14.3 g/dL (ref 11.1–15.9)
Immature Grans (Abs): 0 10*3/uL (ref 0.0–0.1)
Immature Granulocytes: 0 %
Lymphocytes Absolute: 2.5 10*3/uL (ref 0.7–3.1)
Lymphs: 32 %
MCH: 27.6 pg (ref 26.6–33.0)
MCHC: 32.9 g/dL (ref 31.5–35.7)
MCV: 84 fL (ref 79–97)
Monocytes Absolute: 0.6 10*3/uL (ref 0.1–0.9)
Monocytes: 8 %
Neutrophils Absolute: 4.4 10*3/uL (ref 1.4–7.0)
Neutrophils: 57 %
Platelets: 265 10*3/uL (ref 150–450)
RBC: 5.19 x10E6/uL (ref 3.77–5.28)
RDW: 13.8 % (ref 11.7–15.4)
Rheumatoid fact SerPl-aCnc: <10 IU/mL (ref 0.0–13.9)
Sed Rate: 6 mm/hr (ref 0–40)
Uric Acid: 5.7 mg/dL (ref 2.5–7.1)
WBC: 7.7 10*3/uL (ref 3.4–10.8)

## 2019-07-27 LAB — LIPID PANEL
Chol/HDL Ratio: 3.8 ratio (ref 0.0–4.4)
Cholesterol, Total: 255 mg/dL — ABNORMAL HIGH (ref 100–199)
HDL: 67 mg/dL (ref >39)
LDL Chol Calc (NIH): 158 mg/dL — ABNORMAL HIGH (ref 0–99)
Triglycerides: 165 mg/dL — ABNORMAL HIGH (ref 0–149)
VLDL Cholesterol Cal: 30 mg/dL (ref 5–40)

## 2019-08-01 ENCOUNTER — Telehealth: Payer: Self-pay | Admitting: Family Medicine

## 2019-08-01 MED ORDER — PRAVASTATIN SODIUM 20 MG PO TABS: 20.0000 mg | ORAL_TABLET | Freq: Every day | ORAL | 3 refills | Status: DC

## 2019-08-01 NOTE — Telephone Encounter (Signed)
Pravastatin has not been ordered. Please send in new medicine.

## 2019-08-01 NOTE — Telephone Encounter (Signed)
Patient aware that meds was sent in

## 2019-08-01 NOTE — Telephone Encounter (Signed)
Sent pravastatin

## 2019-08-02 ENCOUNTER — Telehealth: Payer: Self-pay | Admitting: Family Medicine

## 2019-08-02 NOTE — Telephone Encounter (Signed)
Calling the breast center to get them to fax over report. Left message for them to call back

## 2019-08-02 NOTE — Telephone Encounter (Signed)
Have you seen Dexa report on your desk by chance I have requested another copy from the breast center.

## 2019-08-03 NOTE — Telephone Encounter (Signed)
Pt aware we are still working on getting this result- one is at scan center and another requested

## 2019-08-03 NOTE — Telephone Encounter (Signed)
Yes I believe I did see it yesterday come through my desk, I put in the scan pile and signed off on it so it should be scanned into her chart

## 2019-08-06 ENCOUNTER — Other Ambulatory Visit: Payer: Self-pay

## 2019-08-06 ENCOUNTER — Ambulatory Visit (INDEPENDENT_AMBULATORY_CARE_PROVIDER_SITE_OTHER): Payer: Medicare Other | Admitting: *Deleted

## 2019-08-06 DIAGNOSIS — Z0000 Encounter for general adult medical examination without abnormal findings: Secondary | ICD-10-CM | POA: Diagnosis not present

## 2019-08-06 DIAGNOSIS — M25539 Pain in unspecified wrist: Secondary | ICD-10-CM | POA: Insufficient documentation
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
## 2019-08-06 NOTE — Patient Instructions (Signed)
Preventive Care 72 Years and Older, Female Preventive care refers to lifestyle choices and visits with your health care provider that can promote health and wellness. This includes:  A yearly physical exam. This is also called an annual well check.  Regular dental and eye exams.  Immunizations.  Screening for certain conditions.  Healthy lifestyle choices, such as diet and exercise. What can I expect for my preventive care visit? Physical exam Your health care provider will check:  Height and weight. These may be used to calculate body mass index (BMI), which is a measurement that tells if you are at a healthy weight.  Heart rate and blood pressure.  Your skin for abnormal spots. Counseling Your health care provider may ask you questions about:  Alcohol, tobacco, and drug use.  Emotional well-being.  Home and relationship well-being.  Sexual activity.  Eating habits.  History of falls.  Memory and ability to understand (cognition).  Work and work Statistician.  Pregnancy and menstrual history. What immunizations do I need?  Influenza (flu) vaccine  This is recommended every year. Tetanus, diphtheria, and pertussis (Tdap) vaccine  You may need a Td booster every 10 years. Varicella (chickenpox) vaccine  You may need this vaccine if you have not already been vaccinated. Zoster (shingles) vaccine  You may need this after age 72. Pneumococcal conjugate (PCV13) vaccine  One dose is recommended after age 72. Pneumococcal polysaccharide (PPSV23) vaccine  One dose is recommended after age 72. Measles, mumps, and rubella (MMR) vaccine  You may need at least one dose of MMR if you were born in 1957 or later. You may also need a second dose. Meningococcal conjugate (MenACWY) vaccine  You may need this if you have certain conditions. Hepatitis A vaccine  You may need this if you have certain conditions or if you travel or work in places where you may be exposed  to hepatitis A. Hepatitis B vaccine  You may need this if you have certain conditions or if you travel or work in places where you may be exposed to hepatitis B. Haemophilus influenzae type b (Hib) vaccine  You may need this if you have certain conditions. You may receive vaccines as individual doses or as more than one vaccine together in one shot (combination vaccines). Talk with your health care provider about the risks and benefits of combination vaccines. What tests do I need? Blood tests  Lipid and cholesterol levels. These may be checked every 5 years, or more frequently depending on your overall health.  Hepatitis C test.  Hepatitis B test. Screening  Lung cancer screening. You may have this screening every year starting at age 72 if you have a 30-pack-year history of smoking and currently smoke or have quit within the past 15 years.  Colorectal cancer screening. All adults should have this screening starting at age 72 and continuing until age 15. Your health care provider may recommend screening at age 72 if you are at increased risk. You will have tests every 1-10 years, depending on your results and the type of screening test.  Diabetes screening. This is done by checking your blood sugar (glucose) after you have not eaten for a while (fasting). You may have this done every 1-3 years.  Mammogram. This may be done every 1-2 years. Talk with your health care provider about how often you should have regular mammograms.  BRCA-related cancer screening. This may be done if you have a family history of breast, ovarian, tubal, or peritoneal cancers.  Other tests  Sexually transmitted disease (STD) testing.  Bone density scan. This is done to screen for osteoporosis. You may have this done starting at age 72. Follow these instructions at home: Eating and drinking  Eat a diet that includes fresh fruits and vegetables, whole grains, lean protein, and low-fat dairy products. Limit  your intake of foods with high amounts of sugar, saturated fats, and salt.  Take vitamin and mineral supplements as recommended by your health care provider.  Do not drink alcohol if your health care provider tells you not to drink.  If you drink alcohol: ? Limit how much you have to 0-1 drink a day. ? Be aware of how much alcohol is in your drink. In the [place] one drink equals one 12 oz bottle of beer (355 mL), one 5 oz glass of wine (148 mL), or one 1 oz glass of hard liquor (44 mL). Lifestyle  Take daily care of your teeth and gums.  Stay active. Exercise for at least 30 minutes on 5 or more days each week.  Do not use any products that contain nicotine or tobacco, such as cigarettes, e-cigarettes, and chewing tobacco. If you need help quitting, ask your health care provider.  If you are sexually active, practice safe sex. Use a condom or other form of protection in order to prevent STIs (sexually transmitted infections).  Talk with your health care provider about taking a low-dose aspirin or statin. What's next?  Go to your health care provider once a year for a well check visit.  Ask your health care provider how often you should have your eyes and teeth checked.  Stay up to date on all vaccines. This information is not intended to replace advice given to you by your health care provider. Make sure you discuss any questions you have with your health care provider. Document Released: 10/24/2015 Document Revised: 09/21/2018 Document Reviewed: 09/21/2018 Elsevier Patient Education  2020 [employer].

## 2019-08-07 DIAGNOSIS — M545 Low back pain, unspecified: Secondary | ICD-10-CM | POA: Insufficient documentation

## 2019-08-07 DIAGNOSIS — M25532 Pain in left wrist: Secondary | ICD-10-CM | POA: Diagnosis not present

## 2019-08-07 DIAGNOSIS — M79645 Pain in left finger(s): Secondary | ICD-10-CM | POA: Diagnosis not present

## 2019-08-07 DIAGNOSIS — M13842 Other specified arthritis, left hand: Secondary | ICD-10-CM | POA: Diagnosis not present

## 2019-08-16 DIAGNOSIS — M545 Low back pain: Secondary | ICD-10-CM | POA: Diagnosis not present

## 2019-08-17 ENCOUNTER — Telehealth: Payer: Self-pay | Admitting: Family Medicine

## 2019-08-17 NOTE — Telephone Encounter (Signed)
Busy x 2 will call back

## 2019-08-17 NOTE — Telephone Encounter (Signed)
Patient aware and verbalized understanding. Televisit made

## 2019-08-17 NOTE — Telephone Encounter (Signed)
I had seen that she fell and fractured her her L5, I think she needs a visit so we can discuss treatment for osteoporosis, if she can get this as soon as she can that would be preferable so we could discuss treatment.  Her bone density scan does show that she has some osteopenia but with her fracture we would go ahead and treat like osteoporosis, have her come in to discuss options for this.  If she wanted to try something else besides those then I would recommend doing an enema, she can buy an enema over-the-counter if she would like and try that and then continue with the MiraLAX every day and then make sure she is taking a lot of water with it.  If that still does not work then let me know.

## 2019-08-17 NOTE — Telephone Encounter (Signed)
Patient is constipated and wants to know what she can do? Patient is taking stool softer, and Miralax. And Senna. She is asking what else she can do. Also Dexa results.

## 2019-08-20 DIAGNOSIS — S32000A Wedge compression fracture of unspecified lumbar vertebra, initial encounter for closed fracture: Secondary | ICD-10-CM | POA: Insufficient documentation

## 2019-08-20 DIAGNOSIS — M545 Low back pain: Secondary | ICD-10-CM | POA: Diagnosis not present

## 2019-08-22 ENCOUNTER — Ambulatory Visit (INDEPENDENT_AMBULATORY_CARE_PROVIDER_SITE_OTHER): Payer: Medicare Other | Admitting: Family Medicine

## 2019-08-22 ENCOUNTER — Encounter: Payer: Self-pay | Admitting: Family Medicine

## 2019-08-22 DIAGNOSIS — M8000XS Age-related osteoporosis with current pathological fracture, unspecified site, sequela: Secondary | ICD-10-CM

## 2019-08-22 MED ORDER — ALENDRONATE SODIUM 70 MG PO TABS: 70.0000 mg | ORAL_TABLET | ORAL | 11 refills | Status: DC

## 2019-08-22 NOTE — Progress Notes (Signed)
Virtual Visit via telephone Note  I connected with [name] on 08/22/19 at 0855 by telephone and verified that I am speaking with the correct person using two identifiers. [name] is currently located at home and no other people are currently with her during visit. The provider, Fransisca Kaufmann Treyvin Glidden, MD is located in their office at time of visit.  Call ended at 0915  I discussed the limitations, risks, security and privacy concerns of performing an evaluation and management service by telephone and the availability of in person appointments. I also discussed with the patient that there may be a patient responsible charge related to this service. The patient expressed understanding and agreed to proceed.   History and Present Illness: Patient had a fall and slipped in the yard and was found to have fracture, compression fracture of lumbar vertebra.  She has had a fall with a wrist issue as well.  She had a recent bone scan that showed that she was -2.6 T score with osteoporosis.  We will start treatment.  Patient does have some pain in her back but it is improving and she does have an orthopedic already.  No diagnosis found.  Outpatient Encounter Medications as of 08/22/2019  Medication Sig  . acetaminophen (TYLENOL) 500 MG tablet Take 625 mg by mouth as needed.   Marland Kitchen aluminum hydroxide-magnesium carbonate (GAVISCON) 95-358 MG/15ML SUSP Take by mouth.  . co-enzyme Q-10 30 MG capsule Take 30 mg by mouth daily.  . hyoscyamine (LEVBID) 0.375 MG 12 hr tablet Take 1 tablet (0.375 mg total) by mouth 2 (two) times daily as needed.  . hyoscyamine (LEVSIN SL) 0.125 MG SL tablet DISSOLVE 1 TABLET IN MOUTH EVERY 8 HOURS AS NEEDED.  Marland Kitchen ketotifen (ALAWAY) 0.025 % ophthalmic solution Place 1 drop into both eyes 2 (two) times daily.  . Magnesium 300 MG CAPS Take 400 mg by mouth 2 (two) times daily.   . Multiple Vitamin (MULTIVITAMIN) capsule Take 1 capsule by mouth daily.  . Omega-3 Fatty Acids  (FISH OIL) 1000 MG CAPS Take by mouth.  . pravastatin (PRAVACHOL) 20 MG tablet Take 1 tablet (20 mg total) by mouth daily.  Marland Kitchen SALINE NASAL SPRAY NA Place into the nose as directed.  . Triamcinolone Acetonide (NASACORT ALLERGY 24HR NA) Place 1 spray into the nose daily.   No facility-administered encounter medications on file as of 08/22/2019.     Review of Systems  Constitutional: Negative for chills and fever.  Eyes: Negative for visual disturbance.  Respiratory: Negative for chest tightness and shortness of breath.   Cardiovascular: Negative for chest pain and leg swelling.  Musculoskeletal: Positive for back pain and myalgias. Negative for gait problem.  Skin: Negative for rash.  Neurological: Negative for light-headedness and headaches.  Psychiatric/Behavioral: Negative for agitation and behavioral problems.  All other systems reviewed and are negative.   Observations/Objective: Patient sounds comfortable  Assessment and Plan: Problem List Items Addressed This Visit      Musculoskeletal and Integument   Osteoporosis - Primary   Relevant Medications   alendronate (FOSAMAX) 70 MG tablet       Follow Up Instructions:  follow up in 3 months and WWE and pelvic, no pap    I discussed the assessment and treatment plan with the patient. The patient was provided an opportunity to ask questions and all were answered. The patient agreed with the plan and demonstrated an understanding of the instructions.   The patient was advised to call  back or seek an in-person evaluation if the symptoms worsen or if the condition fails to improve as anticipated.  The above assessment and management plan was discussed with the patient. The patient verbalized understanding of and has agreed to the management plan. Patient is aware to call the clinic if symptoms persist or worsen. Patient is aware when to return to the clinic for a follow-up visit. Patient educated on when it is appropriate to go to  the emergency department.    I provided 20 minutes of non-face-to-face time during this encounter.    Worthy Rancher, MD

## 2019-08-30 ENCOUNTER — Other Ambulatory Visit: Payer: Self-pay

## 2019-08-30 MED ORDER — HYOSCYAMINE SULFATE ER 0.375 MG PO TB12: 0.3750 mg | ORAL_TABLET | Freq: Two times a day (BID) | ORAL | 0 refills | Status: DC | PRN

## 2019-09-18 DIAGNOSIS — M4856XD Collapsed vertebra, not elsewhere classified, lumbar region, subsequent encounter for fracture with routine healing: Secondary | ICD-10-CM | POA: Diagnosis not present

## 2019-09-20 ENCOUNTER — Other Ambulatory Visit: Payer: Self-pay | Admitting: Gastroenterology

## 2019-09-20 DIAGNOSIS — K588 Other irritable bowel syndrome: Secondary | ICD-10-CM

## 2019-10-23 ENCOUNTER — Other Ambulatory Visit: Payer: Self-pay

## 2019-10-24 ENCOUNTER — Encounter: Payer: Self-pay | Admitting: Family Medicine

## 2019-10-24 ENCOUNTER — Ambulatory Visit (INDEPENDENT_AMBULATORY_CARE_PROVIDER_SITE_OTHER): Payer: Medicare Other | Admitting: Family Medicine

## 2019-10-24 VITALS — BP 130/78 | HR 84 | Temp 97.1°F | Ht 59.25 in | Wt 149.4 lb

## 2019-10-24 DIAGNOSIS — K588 Other irritable bowel syndrome: Secondary | ICD-10-CM | POA: Diagnosis not present

## 2019-10-24 DIAGNOSIS — N814 Uterovaginal prolapse, unspecified: Secondary | ICD-10-CM

## 2019-10-24 DIAGNOSIS — Z131 Encounter for screening for diabetes mellitus: Secondary | ICD-10-CM | POA: Diagnosis not present

## 2019-10-24 DIAGNOSIS — Z01419 Encounter for gynecological examination (general) (routine) without abnormal findings: Secondary | ICD-10-CM

## 2019-10-24 LAB — GLUCOSE HEMOCUE WAIVED: Glu Hemocue Waived: 94 mg/dL (ref 65–99)

## 2019-10-24 MED ORDER — HYOSCYAMINE SULFATE 0.125 MG SL SUBL: SUBLINGUAL_TABLET | SUBLINGUAL | 1 refills | Status: DC

## 2019-10-24 NOTE — Progress Notes (Signed)
BP 130/78   Pulse 84   Temp (!) 97.1 F (36.2 C) (Temporal)   Ht 4' 11.25" (1.505 m)   Wt 149 lb 6.4 oz (67.8 kg)   SpO2 97%   BMI 29.92 kg/m    Subjective:   Patient ID: [name], female    DOB: Feb 14, 1946, 74 y.o.   MRN: [id number]  HPI: [name] is a 74 y.o. female presenting on 10/24/2019 for Gynecologic Exam (pap)   HPI Adult well exam and physical Patient is coming in for routine gynecological exam and physical.  She wants to have a Pap smear even despite being up in age because she is concerned.  She denies any issues vaginally or issues with her breast but she just wants to do an exam and get screened. Patient denies any chest pain, shortness of breath, headaches or vision issues, abdominal complaints, diarrhea, nausea, vomiting, or joint issues.   Relevant past medical, surgical, family and social history reviewed and updated as indicated. Interim medical history since our last visit reviewed. Allergies and medications reviewed and updated.  Review of Systems  Constitutional: Negative for chills and fever.  Eyes: Negative for visual disturbance.  Respiratory: Negative for chest tightness and shortness of breath.   Cardiovascular: Negative for chest pain and leg swelling.  Genitourinary: Negative for difficulty urinating, dysuria, hematuria, menstrual problem, pelvic pain, vaginal bleeding, vaginal discharge and vaginal pain.  Musculoskeletal: Negative for back pain and gait problem.  Skin: Negative for rash.  Neurological: Negative for light-headedness and headaches.  Psychiatric/Behavioral: Negative for agitation and behavioral problems.  All other systems reviewed and are negative.   Per HPI unless specifically indicated above   Allergies as of 10/24/2019      Reactions   Poison Ivy Extract Rash   Benadryl [diphenhydramine Hcl]    Neomycin-polymyxin B Gu Other (See Comments)   unknown   Nsaids Hives   Teriparatide Hives      Medication List       Accurate as of October 24, 2019 10:21 AM. If you have any questions, ask your nurse or doctor.        STOP taking these medications   Alaway 0.025 % ophthalmic solution Generic drug: ketotifen Stopped by: Elige Radon Idaliz Tinkle, MD   co-enzyme Q-10 30 MG capsule Stopped by: Nils Pyle, MD   pravastatin 20 MG tablet Commonly known as: PRAVACHOL Stopped by: Elige Radon Eyoel Throgmorton, MD   SALINE NASAL SPRAY NA Stopped by: Elige Radon Keyaira Clapham, MD     TAKE these medications   alendronate 70 MG tablet Commonly known as: FOSAMAX Take 1 tablet (70 mg total) by mouth every 7 (seven) days. Take with a full glass of water on an empty stomach.   aluminum hydroxide-magnesium carbonate 95-358 MG/15ML Susp Commonly known as: GAVISCON Take by mouth.   famotidine 20 MG tablet Commonly known as: PEPCID Take 20 mg by mouth 2 (two) times daily.   Fish Oil 1000 MG Caps Take by mouth.   hyoscyamine 0.375 MG 12 hr tablet Commonly known as: LEVBID Take 1 tablet (0.375 mg total) by mouth 2 (two) times daily as needed.   hyoscyamine 0.125 MG SL tablet Commonly known as: LEVSIN SL DISSOLVE 1 TABLET IN MOUTH EVERY 8 HOURS AS NEEDED.   Magnesium 300 MG Caps Take 400 mg by mouth 2 (two) times daily.   multivitamin capsule Take 1 capsule by mouth daily.   NASACORT ALLERGY 24HR NA Place 1 spray into the nose  daily.   TYLENOL 500 MG tablet Generic drug: acetaminophen Take 625 mg by mouth as needed.        Objective:   BP 130/78   Pulse 84   Temp (!) 97.1 F (36.2 C) (Temporal)   Ht 4' 11.25" (1.505 m)   Wt 149 lb 6.4 oz (67.8 kg)   SpO2 97%   BMI 29.92 kg/m   Wt Readings from Last 3 Encounters:  10/24/19 149 lb 6.4 oz (67.8 kg)  07/26/19 150 lb 12.8 oz (68.4 kg)  05/04/19 152 lb 12.8 oz (69.3 kg)    Physical Exam Vitals and nursing note reviewed.  Constitutional:      General: She is not in acute distress.    Appearance: She is well-developed. She is not  diaphoretic.  Eyes:     Conjunctiva/sclera: Conjunctivae normal.  Cardiovascular:     Rate and Rhythm: Normal rate and regular rhythm.     Heart sounds: Normal heart sounds. No murmur.  Pulmonary:     Effort: Pulmonary effort is normal. No respiratory distress.     Breath sounds: Normal breath sounds. No wheezing.  Musculoskeletal:        General: No tenderness. Normal range of motion.  Skin:    General: Skin is warm and dry.     Findings: No rash.  Neurological:     Mental Status: She is alert and oriented to person, place, and time.     Coordination: Coordination normal.  Psychiatric:        Behavior: Behavior normal.       Assessment & Plan:   Problem List Items Addressed This Visit      Digestive   IRRITABLE BOWEL SYNDROME   Relevant Medications   famotidine (PEPCID) 20 MG tablet   hyoscyamine (LEVSIN SL) 0.125 MG SL tablet    Other Visit Diagnoses    Well woman exam with routine gynecological exam    -  Primary   Relevant Orders   IGP, Aptima HPV   Diabetes mellitus screening       Relevant Orders   FINGERSTICK BLOOD SUGAR   Uterine prolapse          Will do Pap smear and continue current medication for IBS.  Will screen for blood sugar, in the future next time we will do full blood work. Follow up plan: Return in about 6 months (around 04/22/2020), or if symptoms worsen or fail to improve, for Hypertension and cholesterol.  Counseling provided for all of the vaccine components Orders Placed This Encounter  Procedures  . FINGERSTICK BLOOD SUGAR    Arville Care, MD [facility] 10/24/2019, 10:21 AM

## 2019-10-25 ENCOUNTER — Encounter: Payer: Self-pay | Admitting: Family Medicine

## 2019-10-25 ENCOUNTER — Telehealth: Payer: Self-pay | Admitting: *Deleted

## 2019-10-25 NOTE — Telephone Encounter (Signed)
Hyoscamine Sulfate 0.125mg  sublingual dissolvable tab-Not Covered by [employer], Excluded from coverage-please send in alternate.  No alternatives were given

## 2019-10-26 MED ORDER — LEVBID 0.375 MG PO TB12: 0.3750 mg | ORAL_TABLET | Freq: Two times a day (BID) | ORAL | 3 refills | Status: DC

## 2019-10-26 NOTE — Telephone Encounter (Signed)
Aware of new medicine. 

## 2019-10-26 NOTE — Telephone Encounter (Signed)
Let the patient know that I have sent in a brand-name of Levbid for her and see if that is covered

## 2019-10-29 ENCOUNTER — Other Ambulatory Visit: Payer: Self-pay | Admitting: Family Medicine

## 2019-10-31 LAB — IGP, APTIMA HPV: HPV Aptima: NEGATIVE

## 2019-12-18 ENCOUNTER — Telehealth: Payer: Self-pay | Admitting: Family Medicine

## 2019-12-18 NOTE — Chronic Care Management (AMB) (Signed)
  Chronic Care Management   Outreach Note  12/18/2019 Name: [name] MRN: [id number] DOB: [birth year]  [name] is a 74 y.o. year old female who is a primary care patient of Dettinger, Fransisca Kaufmann, MD. I reached out to [name] by phone today in response to a referral sent by Ms. Audria Nine Henricks's health plan.     An unsuccessful telephone outreach was attempted today. The patient was referred to the case management team for assistance with care management and care coordination.   Follow Up Plan: A HIPPA compliant phone message was left for the patient providing contact information and requesting a return call. The care management team will reach out to the patient again over the next 7 days.  If patient returns call to provider office, please advise to call [place] at [phone].  [place], [place] [address] Direct Dial: [phone] [email or web address]: [email or web address]

## 2019-12-18 NOTE — Chronic Care Management (AMB) (Signed)
  Chronic Care Management   Note  12/18/2019 Name: GWENYTH DINGEE MRN: [id number] DOB: 06-05-1946  YURI FLENER is a 74 y.o. year old female who is a primary care patient of Dettinger, Fransisca Kaufmann, MD. I reached out to [name] by phone today in response to a referral sent by Ms. Audria Nine Weyer's health plan.     Ms. Ivy was given information about Chronic Care Management services today including:  1. CCM service includes personalized support from designated clinical staff supervised by her physician, including individualized plan of care and coordination with other care providers 2. 24/7 contact phone numbers for assistance for urgent and routine care needs. 3. Service will only be billed when office clinical staff spend 20 minutes or more in a month to coordinate care. 4. Only one practitioner may furnish and bill the service in a calendar month. 5. The patient may stop CCM services at any time (effective at the end of the month) by phone call to the office staff. 6. The patient will be responsible for cost sharing (co-pay) of up to 20% of the service fee (after annual deductible is met).  Patient agreed to services and verbal consent obtained.   Follow up plan: Telephone appointment with care management team member scheduled for: 03/04/2020  [facility]  [place], [place] [address] Direct Dial: [email or web address]: [email or web address]

## 2019-12-20 DIAGNOSIS — M25511 Pain in right shoulder: Secondary | ICD-10-CM | POA: Diagnosis not present

## 2019-12-20 DIAGNOSIS — M25811 Other specified joint disorders, right shoulder: Secondary | ICD-10-CM | POA: Diagnosis not present

## 2019-12-25 ENCOUNTER — Other Ambulatory Visit: Payer: Self-pay | Admitting: Orthopedic Surgery

## 2019-12-25 DIAGNOSIS — M25511 Pain in right shoulder: Secondary | ICD-10-CM | POA: Diagnosis not present

## 2019-12-26 ENCOUNTER — Telehealth: Payer: Self-pay | Admitting: Family Medicine

## 2019-12-27 ENCOUNTER — Encounter: Payer: Self-pay | Admitting: Family Medicine

## 2019-12-28 ENCOUNTER — Other Ambulatory Visit: Payer: Self-pay | Admitting: *Deleted

## 2019-12-28 DIAGNOSIS — E782 Mixed hyperlipidemia: Secondary | ICD-10-CM

## 2019-12-28 DIAGNOSIS — Z131 Encounter for screening for diabetes mellitus: Secondary | ICD-10-CM

## 2019-12-28 DIAGNOSIS — I10 Essential (primary) hypertension: Secondary | ICD-10-CM

## 2019-12-28 DIAGNOSIS — K219 Gastro-esophageal reflux disease without esophagitis: Secondary | ICD-10-CM

## 2020-01-01 ENCOUNTER — Other Ambulatory Visit: Payer: Self-pay

## 2020-01-01 ENCOUNTER — Other Ambulatory Visit: Payer: Medicare Other

## 2020-01-01 DIAGNOSIS — E782 Mixed hyperlipidemia: Secondary | ICD-10-CM | POA: Diagnosis not present

## 2020-01-01 DIAGNOSIS — K219 Gastro-esophageal reflux disease without esophagitis: Secondary | ICD-10-CM | POA: Diagnosis not present

## 2020-01-01 DIAGNOSIS — I10 Essential (primary) hypertension: Secondary | ICD-10-CM

## 2020-01-01 DIAGNOSIS — Z131 Encounter for screening for diabetes mellitus: Secondary | ICD-10-CM | POA: Diagnosis not present

## 2020-01-01 DIAGNOSIS — R739 Hyperglycemia, unspecified: Secondary | ICD-10-CM | POA: Diagnosis not present

## 2020-01-01 LAB — CBC WITH DIFFERENTIAL/PLATELET
Basophils Absolute: 0.1 10*3/uL (ref 0.0–0.2)
Basos: 1 %
EOS (ABSOLUTE): 0.1 10*3/uL (ref 0.0–0.4)
Eos: 2 %
Hematocrit: 42.6 % (ref 34.0–46.6)
Hemoglobin: 14.3 g/dL (ref 11.1–15.9)
Immature Grans (Abs): 0 10*3/uL (ref 0.0–0.1)
Immature Granulocytes: 0 %
Lymphocytes Absolute: 2.2 10*3/uL (ref 0.7–3.1)
Lymphs: 36 %
MCH: 28.7 pg (ref 26.6–33.0)
MCHC: 33.6 g/dL (ref 31.5–35.7)
MCV: 86 fL (ref 79–97)
Monocytes Absolute: 0.5 10*3/uL (ref 0.1–0.9)
Monocytes: 8 %
Neutrophils Absolute: 3.3 10*3/uL (ref 1.4–7.0)
Neutrophils: 53 %
Platelets: 232 10*3/uL (ref 150–450)
RBC: 4.98 x10E6/uL (ref 3.77–5.28)
RDW: 13 % (ref 11.7–15.4)
WBC: 6.2 10*3/uL (ref 3.4–10.8)

## 2020-01-01 LAB — CMP14+EGFR
ALT: 12 IU/L (ref 0–32)
AST: 19 IU/L (ref 0–40)
Albumin/Globulin Ratio: 2.1 (ref 1.2–2.2)
Albumin: 4.5 g/dL (ref 3.7–4.7)
Alkaline Phosphatase: 49 IU/L (ref 39–117)
BUN/Creatinine Ratio: 13 (ref 12–28)
BUN: 11 mg/dL (ref 8–27)
Bilirubin Total: 0.4 mg/dL (ref 0.0–1.2)
CO2: 24 mmol/L (ref 20–29)
Calcium: 9.8 mg/dL (ref 8.7–10.3)
Chloride: 102 mmol/L (ref 96–106)
Creatinine, Ser: 0.85 mg/dL (ref 0.57–1.00)
GFR calc Af Amer: 79 mL/min/{1.73_m2} (ref >59)
GFR calc non Af Amer: 69 mL/min/{1.73_m2} (ref >59)
Globulin, Total: 2.1 g/dL (ref 1.5–4.5)
Glucose: 88 mg/dL (ref 65–99)
Potassium: 4.9 mmol/L (ref 3.5–5.2)
Sodium: 140 mmol/L (ref 134–144)
Total Protein: 6.6 g/dL (ref 6.0–8.5)

## 2020-01-01 LAB — BAYER DCA HB A1C WAIVED: HB A1C (BAYER DCA - WAIVED): 6 % (ref <7.0)

## 2020-01-01 LAB — LIPID PANEL
Chol/HDL Ratio: 2.9 ratio (ref 0.0–4.4)
Cholesterol, Total: 201 mg/dL — ABNORMAL HIGH (ref 100–199)
HDL: 69 mg/dL (ref >39)
LDL Chol Calc (NIH): 111 mg/dL — ABNORMAL HIGH (ref 0–99)
Triglycerides: 119 mg/dL (ref 0–149)
VLDL Cholesterol Cal: 21 mg/dL (ref 5–40)

## 2020-01-03 ENCOUNTER — Ambulatory Visit (INDEPENDENT_AMBULATORY_CARE_PROVIDER_SITE_OTHER): Payer: Medicare Other | Admitting: Family Medicine

## 2020-01-03 ENCOUNTER — Other Ambulatory Visit: Payer: Self-pay

## 2020-01-03 VITALS — BP 141/72 | HR 76 | Temp 98.9°F | Ht 59.0 in | Wt 149.0 lb

## 2020-01-03 DIAGNOSIS — Z01818 Encounter for other preprocedural examination: Secondary | ICD-10-CM | POA: Diagnosis not present

## 2020-01-03 NOTE — Progress Notes (Signed)
BP (!) 141/72   Pulse 76   Temp 98.9 F (37.2 C)   Ht 4' 11"  (1.499 m)   Wt 149 lb (67.6 kg)   SpO2 95%   BMI 30.09 kg/m    Subjective:   Patient ID: [name], female    DOB: 12/06/1945, 74 y.o.   MRN: [id number]  HPI: [name] is a 74 y.o. female presenting on 01/03/2020 for Surgical Clearance (Right shoulder surgery needs to be scheduled)   HPI Preoperative clearance Patient is coming in today for preoperative clearance for a right shoulder surgery.  She is going to go for a right labral repair.  She is slotted to go to the surgery soon but does not have a date for it.  Relevant past medical, surgical, family and social history reviewed and updated as indicated. Interim medical history since our last visit reviewed. Allergies and medications reviewed and updated.  Review of Systems  Constitutional: Negative for chills and fever.  Eyes: Negative for visual disturbance.  Respiratory: Negative for chest tightness and shortness of breath.   Cardiovascular: Negative for chest pain and leg swelling.  Musculoskeletal: Positive for arthralgias. Negative for back pain and gait problem.  Skin: Negative for rash.  Neurological: Negative for light-headedness and headaches.  Psychiatric/Behavioral: Negative for agitation and behavioral problems.  All other systems reviewed and are negative.   Per HPI unless specifically indicated above   Allergies as of 01/03/2020      Reactions   Poison Ivy Extract Rash   Benadryl [diphenhydramine Hcl]    Neomycin-polymyxin B Gu Other (See Comments)   unknown   Nsaids Hives   Teriparatide Hives      Medication List       Accurate as of January 03, 2020 11:59 PM. If you have any questions, ask your nurse or doctor.        alendronate 70 MG tablet Commonly known as: FOSAMAX Take 1 tablet (70 mg total) by mouth every 7 (seven) days. Take with a full glass of water on an empty stomach.   aluminum hydroxide-magnesium carbonate  95-358 MG/15ML Susp Commonly known as: GAVISCON Take by mouth.   Calcium 1000 + D 1000-800 MG-UNIT Tabs Generic drug: Calcium Carb-Cholecalciferol Take 600 mg by mouth daily.   cholecalciferol 25 MCG (1000 UNIT) tablet Commonly known as: VITAMIN D3 Take 1,000 Units by mouth daily.   co-enzyme Q-10 30 MG capsule Take 30 mg by mouth daily.   famotidine 20 MG tablet Commonly known as: PEPCID Take 20 mg by mouth 2 (two) times daily.   Fish Oil 1000 MG Caps Take 1,200 mg by mouth daily.   hyoscyamine 0.125 MG SL tablet Commonly known as: LEVSIN SL Place 0.125 mg under the tongue every 4 (four) hours as needed. What changed: Another medication with the same name was changed. Make sure you understand how and when to take each.   hyoscyamine 0.375 MG 12 hr tablet Commonly known as: LEVBID TAKE 1 TABLET BY MOUTH TWICE DAILY What changed:   how much to take  how to take this  when to take this  reasons to take this   Magnesium 300 MG Caps Take 400 mg by mouth daily.   multivitamin capsule Take 1 capsule by mouth daily.   NASACORT ALLERGY 24HR NA Place 1 spray into the nose daily.   pravastatin 20 MG tablet Commonly known as: PRAVACHOL Take 20 mg by mouth 3 (three) times a week.   TURMERIC PO  Take 1,000 mg by mouth in the morning and at bedtime.   TYLENOL 500 MG tablet Generic drug: acetaminophen Take 650 mg by mouth as needed.        Objective:   BP (!) 141/72   Pulse 76   Temp 98.9 F (37.2 C)   Ht 4' 11"  (1.499 m)   Wt 149 lb (67.6 kg)   SpO2 95%   BMI 30.09 kg/m   Wt Readings from Last 3 Encounters:  01/03/20 149 lb (67.6 kg)  10/24/19 149 lb 6.4 oz (67.8 kg)  07/26/19 150 lb 12.8 oz (68.4 kg)    Physical Exam Vitals and nursing note reviewed.  Constitutional:      General: She is not in acute distress.    Appearance: She is well-developed. She is not diaphoretic.  Eyes:     Conjunctiva/sclera: Conjunctivae normal.  Cardiovascular:      Rate and Rhythm: Normal rate and regular rhythm.     Heart sounds: Normal heart sounds. No murmur.  Pulmonary:     Effort: Pulmonary effort is normal. No respiratory distress.     Breath sounds: Normal breath sounds. No wheezing.  Musculoskeletal:        General: No swelling.  Skin:    General: Skin is warm and dry.     Findings: No rash.  Neurological:     Mental Status: She is alert and oriented to person, place, and time.     Coordination: Coordination normal.  Psychiatric:        Behavior: Behavior normal.     Results for orders placed or performed in visit on 01/01/20  Lipid panel  Result Value Ref Range   Cholesterol, Total 201 (H) 100 - 199 mg/dL   Triglycerides 119 0 - 149 mg/dL   HDL 69 >39 mg/dL   VLDL Cholesterol Cal 21 5 - 40 mg/dL   LDL Chol Calc (NIH) 111 (H) 0 - 99 mg/dL   Chol/HDL Ratio 2.9 0.0 - 4.4 ratio  Bayer DCA Hb A1c Waived  Result Value Ref Range   HB A1C (BAYER DCA - WAIVED) 6.0 <7.0 %  CMP14+EGFR  Result Value Ref Range   Glucose 88 65 - 99 mg/dL   BUN 11 8 - 27 mg/dL   Creatinine, Ser 0.85 0.57 - 1.00 mg/dL   GFR calc non Af Amer 69 >59 mL/min/1.73   GFR calc Af Amer 79 >59 mL/min/1.73   BUN/Creatinine Ratio 13 12 - 28   Sodium 140 134 - 144 mmol/L   Potassium 4.9 3.5 - 5.2 mmol/L   Chloride 102 96 - 106 mmol/L   CO2 24 20 - 29 mmol/L   Calcium 9.8 8.7 - 10.3 mg/dL   Total Protein 6.6 6.0 - 8.5 g/dL   Albumin 4.5 3.7 - 4.7 g/dL   Globulin, Total 2.1 1.5 - 4.5 g/dL   Albumin/Globulin Ratio 2.1 1.2 - 2.2   Bilirubin Total 0.4 0.0 - 1.2 mg/dL   Alkaline Phosphatase 49 39 - 117 IU/L   AST 19 0 - 40 IU/L   ALT 12 0 - 32 IU/L  CBC with Differential/Platelet  Result Value Ref Range   WBC 6.2 3.4 - 10.8 x10E3/uL   RBC 4.98 3.77 - 5.28 x10E6/uL   Hemoglobin 14.3 11.1 - 15.9 g/dL   Hematocrit 42.6 34.0 - 46.6 %   MCV 86 79 - 97 fL   MCH 28.7 26.6 - 33.0 pg   MCHC 33.6 31.5 - 35.7 g/dL   RDW  13.0 11.7 - 15.4 %   Platelets 232 150 - 450  x10E3/uL   Neutrophils 53 Not Estab. %   Lymphs 36 Not Estab. %   Monocytes 8 Not Estab. %   Eos 2 Not Estab. %   Basos 1 Not Estab. %   Neutrophils Absolute 3.3 1.4 - 7.0 x10E3/uL   Lymphocytes Absolute 2.2 0.7 - 3.1 x10E3/uL   Monocytes Absolute 0.5 0.1 - 0.9 x10E3/uL   EOS (ABSOLUTE) 0.1 0.0 - 0.4 x10E3/uL   Basophils Absolute 0.1 0.0 - 0.2 x10E3/uL   Immature Granulocytes 0 Not Estab. %   Immature Grans (Abs) 0.0 0.0 - 0.1 x10E3/uL   EKG: Normal sinus rhythm Assessment & Plan:   Problem List Items Addressed This Visit    None    Visit Diagnoses    Preoperative clearance    -  Primary   Relevant Orders   EKG 12-Lead (Completed)      Cleared for surgery, no signs of anything that would prevent her from surgery.  She was instructed to discuss the Fosamax prior to the surgery with her surgeon. Follow up plan: Return if symptoms worsen or fail to improve.  Counseling provided for all of the vaccine components Orders Placed This Encounter  Procedures  . EKG 12-Lead    Caryl Pina, MD [facility] 01/07/2020, 9:37 PM

## 2020-01-07 ENCOUNTER — Encounter: Payer: Self-pay | Admitting: Family Medicine

## 2020-01-08 ENCOUNTER — Other Ambulatory Visit: Payer: Self-pay

## 2020-01-08 ENCOUNTER — Ambulatory Visit
Admission: RE | Admit: 2020-01-08 | Discharge: 2020-01-08 | Disposition: A | Discharge status: Home / Self Care | Payer: Medicare Other | Source: Ambulatory Visit | Attending: Orthopedic Surgery | Admitting: Orthopedic Surgery

## 2020-01-08 DIAGNOSIS — M25511 Pain in right shoulder: Secondary | ICD-10-CM

## 2020-01-08 DIAGNOSIS — M19011 Primary osteoarthritis, right shoulder: Secondary | ICD-10-CM | POA: Diagnosis not present

## 2020-01-24 ENCOUNTER — Other Ambulatory Visit: Payer: Medicare Other

## 2020-01-24 ENCOUNTER — Ambulatory Visit: Payer: Medicare Other | Admitting: Family Medicine

## 2020-03-04 ENCOUNTER — Ambulatory Visit (INDEPENDENT_AMBULATORY_CARE_PROVIDER_SITE_OTHER): Payer: Medicare Other | Admitting: *Deleted

## 2020-03-04 DIAGNOSIS — M8000XS Age-related osteoporosis with current pathological fracture, unspecified site, sequela: Secondary | ICD-10-CM | POA: Diagnosis not present

## 2020-03-04 DIAGNOSIS — I10 Essential (primary) hypertension: Secondary | ICD-10-CM | POA: Diagnosis not present

## 2020-03-04 DIAGNOSIS — E782 Mixed hyperlipidemia: Secondary | ICD-10-CM | POA: Diagnosis not present

## 2020-03-04 NOTE — Patient Instructions (Signed)
Visit Information  Goals Addressed            This Visit's Progress   . Chronic Disease Management Needs       CARE PLAN ENTRY (see longtitudinal plan of care for additional care plan information)  Current Barriers:  . Chronic Disease Management support, education, and care coordination needs related to HTN, OA, osteoporosis, depression, HLD  Clinical Goal(s) related to HTN, OA, osteoporosis, depression, HLD:  Over the next 90 days, patient will:  . Work with the care management team to address educational, disease management, and care coordination needs  . Call provider office for new or worsened signs and symptoms . Call care management team with questions or concerns . Verbalize basic understanding of patient centered plan of care established today  Interventions related to HTN, OA, osteoporosis, depression, HLD:  . Evaluation of current treatment plans and patient's adherence to plan as established by provider . Assessed patient understanding of disease states . Assessed patient's education and care coordination needs . Provided disease specific education to patient  . Collaborated with appropriate clinical care team members regarding patient needs . Provided with RN Care Manager telephone number and encouraged to reach out as needed . Reviewed and discussed most recent dexa scan and osteoporosis treatment . Reviewed upcoming appt: 04/17/20 with Dr Dettinger  Patient Self Care Activities related to HTN, OA, osteoporosis, depression, HLD:  . Patient is unable to independently self-manage chronic health conditions  Initial goal documentation        Ms. Sperbeck was given information about Chronic Care Management services today including:  1. CCM service includes personalized support from designated clinical staff supervised by her physician, including individualized plan of care and coordination with other care providers 2. 24/7 contact phone numbers for assistance for urgent  and routine care needs. 3. Service will only be billed when office clinical staff spend 20 minutes or more in a month to coordinate care. 4. Only one practitioner may furnish and bill the service in a calendar month. 5. The patient may stop CCM services at any time (effective at the end of the month) by phone call to the office staff. 6. The patient will be responsible for cost sharing (co-pay) of up to 20% of the service fee (after annual deductible is met).  Patient agreed to services and verbal consent obtained.   The patient verbalized understanding of instructions provided today and declined a print copy of patient instruction materials.   The care management team will reach out to the patient again over the next 90 days.    [name], BSN, RN-BC Embedded Chronic Care Manager [facility] / [place] [facility] Direct Dial: [phone]

## 2020-03-04 NOTE — Chronic Care Management (AMB) (Signed)
Chronic Care Management   Initial Visit Note  03/04/2020 Name: [name] MRN: [id number] DOB: August 25, 1946  Referred by: Dettinger, Fransisca Kaufmann, MD Reason for referral : Chronic Care Management (Initial Visit)   [name] is a 74 y.o. year old female who is a primary care patient of Dettinger, Fransisca Kaufmann, MD. The CCM team was consulted for assistance with chronic disease management and care coordination needs related to HTN, OA, osteoporosis, depression, HLD  Review of patient status, including review of consultants reports, relevant laboratory and other test results, and collaboration with appropriate care team members and the patient's provider was performed as part of comprehensive patient evaluation and provision of chronic care management services.    Subjective: I spoke with [name] by telephone today regarding management of her chronic medical conditions. 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 They are very active around their home and go to the gym 5 days a week. She feels that her chronic medical conditions are well managed at this time but agrees to a follow-up call in a few months.  SDOH (Social Determinants of Health) assessments performed: Yes See Care Plan activities for detailed interventions related to SDOH     Objective: Outpatient Encounter Medications as of 03/04/2020  Medication Sig Note  . alendronate (FOSAMAX) 70 MG tablet Take 1 tablet (70 mg total) by mouth every 7 (seven) days. Take with a full glass of water on an empty stomach.   Marland Kitchen aluminum hydroxide-magnesium carbonate (GAVISCON) 95-358 MG/15ML SUSP Take by mouth.   . Calcium Carb-Cholecalciferol (CALCIUM 1000 + D) 1000-800 MG-UNIT TABS Take 600 mg by mouth daily.   . cholecalciferol (VITAMIN D3) 25 MCG (1000 UNIT) tablet Take 1,000 Units by mouth daily.   Marland Kitchen co-enzyme Q-10 30 MG capsule Take 30 mg by mouth daily.   . famotidine (PEPCID) 20 MG tablet Take 20 mg by mouth 2 (two) times  daily.   . Magnesium 300 MG CAPS Take 400 mg by mouth daily.  12/01/2017: Taking 500 mg.  . Multiple Vitamin (MULTIVITAMIN) capsule Take 1 capsule by mouth daily.   . Omega-3 Fatty Acids (FISH OIL) 1000 MG CAPS Take 1,200 mg by mouth daily.  08/06/2019: BID  . pravastatin (PRAVACHOL) 20 MG tablet Take 20 mg by mouth 3 (three) times a week.   . Triamcinolone Acetonide (NASACORT ALLERGY 24HR NA) Place 1 spray into the nose daily.   . TURMERIC PO Take 1,000 mg by mouth in the morning and at bedtime.   Marland Kitchen acetaminophen (TYLENOL) 500 MG tablet Take 650 mg by mouth as needed.    . hyoscyamine (LEVBID) 0.375 MG 12 hr tablet TAKE 1 TABLET BY MOUTH TWICE DAILY (Patient taking differently: every 12 (twelve) hours as needed. )   . hyoscyamine (LEVSIN SL) 0.125 MG SL tablet Place 0.125 mg under the tongue every 4 (four) hours as needed.    No facility-administered encounter medications on file as of 03/04/2020.     BP Readings from Last 3 Encounters:  01/03/20 (!) 141/72  10/24/19 130/78  07/26/19 116/73   Lab Results  Component Value Date   CHOL 201 (H) 01/01/2020   HDL 69 01/01/2020   LDLCALC 111 (H) 01/01/2020   LDLDIRECT 155.9 12/07/2012   TRIG 119 01/01/2020   CHOLHDL 2.9 01/01/2020     RN Care Plan   . Chronic Disease Management Needs       CARE PLAN ENTRY (see longtitudinal plan of care for  additional care plan information)  Current Barriers:  . Chronic Disease Management support, education, and care coordination needs related to HTN, OA, osteoporosis, depression, HLD  Clinical Goal(s) related to HTN, OA, osteoporosis, depression, HLD:  Over the next 90 days, patient will:  . Work with the care management team to address educational, disease management, and care coordination needs  . Call provider office for new or worsened signs and symptoms . Call care management team with questions or concerns . Verbalize basic understanding of patient centered plan of care established  today  Interventions related to HTN, OA, osteoporosis, depression, HLD:  . Evaluation of current treatment plans and patient's adherence to plan as established by provider . Assessed patient understanding of disease states . Assessed patient's education and care coordination needs . Provided disease specific education to patient  . Collaborated with appropriate clinical care team members regarding patient needs . Provided with RN Care Manager telephone number and encouraged to reach out as needed . Reviewed and discussed most recent dexa scan and osteoporosis treatment . Reviewed upcoming appt: 04/17/20 with Dr Dettinger  Patient Self Care Activities related to HTN, OA, osteoporosis, depression, HLD:  . Patient is unable to independently self-manage chronic health conditions  Initial goal documentation         Plan:   The care management team will reach out to the patient again over the next 90 days.   [name], BSN, RN-BC Embedded Chronic Care Manager [facility] [place] [facility] Direct Dial: [phone]

## 2020-04-16 DIAGNOSIS — Z1231 Encounter for screening mammogram for malignant neoplasm of breast: Secondary | ICD-10-CM | POA: Diagnosis not present

## 2020-04-21 ENCOUNTER — Other Ambulatory Visit: Payer: Self-pay | Admitting: Family Medicine

## 2020-04-21 ENCOUNTER — Other Ambulatory Visit: Payer: Medicare Other

## 2020-04-21 ENCOUNTER — Other Ambulatory Visit: Payer: Self-pay

## 2020-04-21 DIAGNOSIS — Z131 Encounter for screening for diabetes mellitus: Secondary | ICD-10-CM

## 2020-04-21 DIAGNOSIS — I10 Essential (primary) hypertension: Secondary | ICD-10-CM

## 2020-04-21 DIAGNOSIS — E782 Mixed hyperlipidemia: Secondary | ICD-10-CM

## 2020-04-21 DIAGNOSIS — R7303 Prediabetes: Secondary | ICD-10-CM | POA: Diagnosis not present

## 2020-04-21 DIAGNOSIS — K219 Gastro-esophageal reflux disease without esophagitis: Secondary | ICD-10-CM

## 2020-04-21 LAB — BAYER DCA HB A1C WAIVED: HB A1C (BAYER DCA - WAIVED): 6 % (ref <7.0)

## 2020-04-21 NOTE — Progress Notes (Signed)
Placed orders for patient

## 2020-04-22 LAB — BMP8+EGFR
BUN/Creatinine Ratio: 17 (ref 12–28)
BUN: 13 mg/dL (ref 8–27)
CO2: 24 mmol/L (ref 20–29)
Calcium: 8.9 mg/dL (ref 8.7–10.3)
Chloride: 103 mmol/L (ref 96–106)
Creatinine, Ser: 0.77 mg/dL (ref 0.57–1.00)
GFR calc Af Amer: 89 mL/min/{1.73_m2} (ref >59)
GFR calc non Af Amer: 77 mL/min/{1.73_m2} (ref >59)
Glucose: 87 mg/dL (ref 65–99)
Potassium: 4.2 mmol/L (ref 3.5–5.2)
Sodium: 140 mmol/L (ref 134–144)

## 2020-04-23 ENCOUNTER — Encounter: Payer: Self-pay | Admitting: Family Medicine

## 2020-04-23 ENCOUNTER — Other Ambulatory Visit: Payer: Medicare Other

## 2020-04-23 ENCOUNTER — Ambulatory Visit (INDEPENDENT_AMBULATORY_CARE_PROVIDER_SITE_OTHER): Payer: Medicare Other | Admitting: Family Medicine

## 2020-04-23 ENCOUNTER — Other Ambulatory Visit: Payer: Self-pay

## 2020-04-23 VITALS — BP 108/71 | HR 87 | Temp 98.0°F | Ht 59.0 in | Wt 149.0 lb

## 2020-04-23 DIAGNOSIS — I10 Essential (primary) hypertension: Secondary | ICD-10-CM | POA: Diagnosis not present

## 2020-04-23 DIAGNOSIS — I7090 Unspecified atherosclerosis: Secondary | ICD-10-CM

## 2020-04-23 DIAGNOSIS — R7303 Prediabetes: Secondary | ICD-10-CM

## 2020-04-23 DIAGNOSIS — E782 Mixed hyperlipidemia: Secondary | ICD-10-CM | POA: Diagnosis not present

## 2020-04-23 NOTE — Progress Notes (Signed)
BP 108/71   Pulse 87   Temp 98 F (36.7 C)   Ht 4\' 11"  (1.499 m)   Wt 149 lb (67.6 kg)   SpO2 95%   BMI 30.09 kg/m    Subjective:   Patient ID: [name], female    DOB: 09/23/1946, 74 y.o.   MRN: [id number]  HPI: [name] is a 74 y.o. female presenting on 04/23/2020 for Medical Management of Chronic Issues, Hyperlipidemia, and Hypertension   HPI Hypertension Patient is currently on no medication currently, and their blood pressure today is 108/71. Patient denies any lightheadedness or dizziness. Patient denies headaches, blurred vision, chest pains, shortness of breath, or weakness. Denies any side effects from medication and is content with current medication.   Hyperlipidemia Patient is coming in for recheck of his hyperlipidemia. The patient is currently taking fish oil and pravastatin. They deny any issues with myalgias or history of liver damage from it. They deny any focal numbness or weakness or chest pain.   Patient had a CT of her right shoulder showing that she has atherosclerosis in the arteries of area.  Patient has prediabetes based on A1c of 6.0, she is following diet and exercise and keeping it there, it was 6.0 the last time as well.  Relevant past medical, surgical, family and social history reviewed and updated as indicated. Interim medical history since our last visit reviewed. Allergies and medications reviewed and updated.  Review of Systems  Constitutional: Negative for chills and fever.  Eyes: Negative for visual disturbance.  Respiratory: Negative for chest tightness and shortness of breath.   Cardiovascular: Negative for chest pain and leg swelling.  Musculoskeletal: Negative for back pain and gait problem.  Skin: Negative for rash.  Neurological: Negative for light-headedness and headaches.  Psychiatric/Behavioral: Negative for agitation and behavioral problems.  All other systems reviewed and are negative.   Per HPI unless  specifically indicated above   Allergies as of 04/23/2020      Reactions   Poison Ivy Extract Rash   Benadryl [diphenhydramine Hcl]    Neomycin-polymyxin B Gu Other (See Comments)   unknown   Nsaids Hives   Teriparatide Hives      Medication List       Accurate as of April 23, 2020  9:07 AM. If you have any questions, ask your nurse or doctor.        STOP taking these medications   Magnesium 300 MG Caps Stopped by: Elige Radon Julien Oscar, MD     TAKE these medications   alendronate 70 MG tablet Commonly known as: FOSAMAX Take 1 tablet (70 mg total) by mouth every 7 (seven) days. Take with a full glass of water on an empty stomach.   aluminum hydroxide-magnesium carbonate 95-358 MG/15ML Susp Commonly known as: GAVISCON Take by mouth.   Calcium 1000 + D 1000-800 MG-UNIT Tabs Generic drug: Calcium Carb-Cholecalciferol Take 600 mg by mouth daily.   cholecalciferol 25 MCG (1000 UNIT) tablet Commonly known as: VITAMIN D3 Take 1,000 Units by mouth daily.   co-enzyme Q-10 30 MG capsule Take 30 mg by mouth daily.   famotidine 20 MG tablet Commonly known as: PEPCID Take 20 mg by mouth 2 (two) times daily.   Fish Oil 1000 MG Caps Take 1,200 mg by mouth in the morning and at bedtime.   hyoscyamine 0.125 MG SL tablet Commonly known as: LEVSIN SL Place 0.125 mg under the tongue every 4 (four) hours as needed. What changed: Another  medication with the same name was changed. Make sure you understand how and when to take each.   hyoscyamine 0.375 MG 12 hr tablet Commonly known as: LEVBID TAKE 1 TABLET BY MOUTH TWICE DAILY What changed:   how much to take  how to take this  when to take this  reasons to take this   Magnesium 500 MG Caps Take 500 mg by mouth.   multivitamin capsule Take 1 capsule by mouth daily.   NASACORT ALLERGY 24HR NA Place 1 spray into the nose daily.   pravastatin 20 MG tablet Commonly known as: PRAVACHOL Take 20 mg by mouth 3 (three)  times a week.   PROBIOTIC-10 PO Take by mouth daily.   psyllium 58.6 % packet Commonly known as: METAMUCIL Take 1 packet by mouth 2 (two) times daily.   TURMERIC PO Take 2,000 mg by mouth in the morning and at bedtime.   TYLENOL 500 MG tablet Generic drug: acetaminophen Take 650 mg by mouth as needed.        Objective:   BP 108/71   Pulse 87   Temp 98 F (36.7 C)   Ht 4\' 11"  (1.499 m)   Wt 149 lb (67.6 kg)   SpO2 95%   BMI 30.09 kg/m   Wt Readings from Last 3 Encounters:  04/23/20 149 lb (67.6 kg)  01/03/20 149 lb (67.6 kg)  10/24/19 149 lb 6.4 oz (67.8 kg)    Physical Exam Vitals and nursing note reviewed.  Constitutional:      General: She is not in acute distress.    Appearance: She is well-developed. She is not diaphoretic.  Eyes:     Conjunctiva/sclera: Conjunctivae normal.  Cardiovascular:     Rate and Rhythm: Normal rate and regular rhythm.     Heart sounds: Normal heart sounds. No murmur heard.   Pulmonary:     Effort: Pulmonary effort is normal. No respiratory distress.     Breath sounds: Normal breath sounds. No wheezing.  Musculoskeletal:        General: No tenderness. Normal range of motion.  Skin:    General: Skin is warm and dry.     Findings: No rash.  Neurological:     Mental Status: She is alert and oriented to person, place, and time.     Coordination: Coordination normal.  Psychiatric:        Behavior: Behavior normal.       Assessment & Plan:   Problem List Items Addressed This Visit      Cardiovascular and Mediastinum   Hypertension - Primary   Atherosclerosis     Other   HLD (hyperlipidemia)    Other Visit Diagnoses    Prediabetes          Will continue current medication, patient still has an A1c of 6.0 also prediabetes, she is following diet and exercise for this. Follow up plan: Return in about 6 months (around 10/24/2020), or if symptoms worsen or fail to improve, for Hyperlipidemia and  hypertension.  Counseling provided for all of the vaccine components No orders of the defined types were placed in this encounter.   Arville Care, MD [facility] 04/23/2020, 9:07 AM

## 2020-05-12 ENCOUNTER — Other Ambulatory Visit: Payer: Self-pay | Admitting: *Deleted

## 2020-05-12 DIAGNOSIS — M8000XS Age-related osteoporosis with current pathological fracture, unspecified site, sequela: Secondary | ICD-10-CM

## 2020-05-12 MED ORDER — HYOSCYAMINE SULFATE 0.125 MG SL SUBL: 0.1250 mg | SUBLINGUAL_TABLET | SUBLINGUAL | 3 refills | Status: DC | PRN

## 2020-05-12 MED ORDER — PRAVASTATIN SODIUM 20 MG PO TABS: 20.0000 mg | ORAL_TABLET | ORAL | 3 refills | Status: DC

## 2020-05-12 MED ORDER — ALENDRONATE SODIUM 70 MG PO TABS: 70.0000 mg | ORAL_TABLET | ORAL | 0 refills | Status: DC

## 2020-05-12 MED ORDER — HYOSCYAMINE SULFATE ER 0.375 MG PO TB12: 0.3750 mg | ORAL_TABLET | Freq: Two times a day (BID) | ORAL | 3 refills | Status: DC

## 2020-05-29 ENCOUNTER — Ambulatory Visit: Payer: Medicare Other | Admitting: *Deleted

## 2020-05-29 DIAGNOSIS — I10 Essential (primary) hypertension: Secondary | ICD-10-CM

## 2020-05-29 DIAGNOSIS — E782 Mixed hyperlipidemia: Secondary | ICD-10-CM

## 2020-05-29 NOTE — Patient Instructions (Signed)
Olivine Hiers, BSN, RN-BC Embedded Chronic Care Manager [facility] Direct Dial: [phone]    

## 2020-05-29 NOTE — Chronic Care Management (AMB) (Signed)
  Chronic Care Management   Follow Up Note   05/29/2020 Name: [name] MRN: [id number] DOB: 1946-10-08  Referred by: Dettinger, Fransisca Kaufmann, MD Reason for referral : Chronic Care Management (RN follow up)   [name] is a 74 y.o. year old female who is a primary care patient of Dettinger, Fransisca Kaufmann, MD. The CCM team was consulted for assistance with chronic disease management and care coordination needs.    Review of patient status, including review of consultants reports, relevant laboratory and other test results, and collaboration with appropriate care team members and the patient's provider was performed as part of comprehensive patient evaluation and provision of chronic care management services.    SDOH (Social Determinants of Health) assessments performed: No See Care Plan activities for detailed interventions related to Gas)    I spoke with [name]'s husband, [name], today by telephone regarding management of their chronic medical conditions. Per my last visit with [name], she did not have any resource or CCM needs and [name] confirmed that that is still true for both of them. He appreciated the outreach but it was decided that they did not need CCM services at this. Advised that they can be added back at any time the need arises.   Plan:  CCM enrollment status changed to "previously enrolled" as per patient request on 05/29/20 to discontinue enrollment. Case closed to case management services in primary care home.    [name], BSN, RN-BC Embedded Chronic Care Manager [facility] [place] [facility] Direct Dial: [phone]

## 2020-07-09 DIAGNOSIS — L812 Freckles: Secondary | ICD-10-CM | POA: Diagnosis not present

## 2020-07-09 DIAGNOSIS — L821 Other seborrheic keratosis: Secondary | ICD-10-CM | POA: Diagnosis not present

## 2020-07-09 DIAGNOSIS — M7130 Other bursal cyst, unspecified site: Secondary | ICD-10-CM | POA: Diagnosis not present

## 2020-07-11 ENCOUNTER — Other Ambulatory Visit: Payer: Self-pay | Admitting: Family Medicine

## 2020-07-11 DIAGNOSIS — M8000XS Age-related osteoporosis with current pathological fracture, unspecified site, sequela: Secondary | ICD-10-CM

## 2020-07-15 ENCOUNTER — Encounter: Payer: Self-pay | Admitting: Family Medicine

## 2020-07-15 DIAGNOSIS — M8000XS Age-related osteoporosis with current pathological fracture, unspecified site, sequela: Secondary | ICD-10-CM

## 2020-07-15 MED ORDER — ALENDRONATE SODIUM 70 MG PO TABS: ORAL_TABLET | ORAL | 3 refills | Status: DC

## 2020-08-07 ENCOUNTER — Other Ambulatory Visit: Payer: Self-pay

## 2020-08-07 ENCOUNTER — Ambulatory Visit (INDEPENDENT_AMBULATORY_CARE_PROVIDER_SITE_OTHER): Payer: Medicare Other

## 2020-08-07 DIAGNOSIS — Z23 Encounter for immunization: Secondary | ICD-10-CM | POA: Diagnosis not present

## 2020-08-09 DIAGNOSIS — H10013 Acute follicular conjunctivitis, bilateral: Secondary | ICD-10-CM | POA: Diagnosis not present

## 2020-08-09 DIAGNOSIS — H40033 Anatomical narrow angle, bilateral: Secondary | ICD-10-CM | POA: Diagnosis not present

## 2020-08-12 ENCOUNTER — Ambulatory Visit (INDEPENDENT_AMBULATORY_CARE_PROVIDER_SITE_OTHER): Payer: Medicare Other | Admitting: Ophthalmology

## 2020-08-12 ENCOUNTER — Encounter (INDEPENDENT_AMBULATORY_CARE_PROVIDER_SITE_OTHER): Payer: Self-pay | Admitting: Ophthalmology

## 2020-08-12 ENCOUNTER — Other Ambulatory Visit: Payer: Self-pay

## 2020-08-12 DIAGNOSIS — H25813 Combined forms of age-related cataract, bilateral: Secondary | ICD-10-CM

## 2020-08-12 DIAGNOSIS — H43812 Vitreous degeneration, left eye: Secondary | ICD-10-CM

## 2020-08-12 DIAGNOSIS — Z8619 Personal history of other infectious and parasitic diseases: Secondary | ICD-10-CM

## 2020-08-12 DIAGNOSIS — H3581 Retinal edema: Secondary | ICD-10-CM | POA: Diagnosis not present
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
## 2020-08-15 ENCOUNTER — Encounter (INDEPENDENT_AMBULATORY_CARE_PROVIDER_SITE_OTHER): Payer: Self-pay | Admitting: Ophthalmology
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
## 2020-09-09 ENCOUNTER — Other Ambulatory Visit: Payer: Self-pay

## 2020-09-09 ENCOUNTER — Ambulatory Visit (INDEPENDENT_AMBULATORY_CARE_PROVIDER_SITE_OTHER): Payer: Medicare Other | Admitting: Ophthalmology

## 2020-09-09 ENCOUNTER — Encounter (INDEPENDENT_AMBULATORY_CARE_PROVIDER_SITE_OTHER): Payer: Self-pay | Admitting: Ophthalmology

## 2020-09-09 DIAGNOSIS — H25813 Combined forms of age-related cataract, bilateral: Secondary | ICD-10-CM

## 2020-09-09 DIAGNOSIS — H3581 Retinal edema: Secondary | ICD-10-CM

## 2020-09-09 DIAGNOSIS — Z8619 Personal history of other infectious and parasitic diseases: Secondary | ICD-10-CM

## 2020-09-09 DIAGNOSIS — H43812 Vitreous degeneration, left eye: Secondary | ICD-10-CM

## 2020-09-18 ENCOUNTER — Other Ambulatory Visit: Payer: Self-pay | Admitting: Family Medicine

## 2020-12-15 ENCOUNTER — Other Ambulatory Visit: Payer: Self-pay

## 2020-12-15 ENCOUNTER — Encounter: Payer: Self-pay | Admitting: Family Medicine

## 2020-12-15 ENCOUNTER — Ambulatory Visit (INDEPENDENT_AMBULATORY_CARE_PROVIDER_SITE_OTHER): Payer: Medicare Other | Admitting: Family Medicine

## 2020-12-15 VITALS — BP 124/70 | HR 75 | Temp 98.7°F | Ht 59.0 in | Wt 146.2 lb

## 2020-12-15 DIAGNOSIS — M791 Myalgia, unspecified site: Secondary | ICD-10-CM | POA: Diagnosis not present
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
## 2020-12-15 NOTE — Patient Instructions (Signed)
Leg Cramps Leg cramps occur when one or more muscles tighten and a person has no control over it (involuntary muscle contraction). Muscle cramps are most common in the calf muscles of the leg. They can occur during exercise or at rest. Leg cramps are painful, and they may last for a few seconds to a few minutes. Cramps may return several times before they finally stop. Usually, leg cramps are not caused by a serious medical problem. In many cases, the cause is not known. Some common causes include:  Excessive physical effort (overexertion), such as during intense exercise.  Doing the same motion over and over.  Staying in a certain position for a long period of time.  Improper preparation, form, or technique while doing a sport or an activity.  Dehydration.  Injury.  Side effects of certain medicines.  Abnormally low levels of minerals in your blood (electrolytes), especially potassium and calcium. This could result from: ? Pregnancy. ? Taking diuretic medicines. Follow these instructions at home: Eating and drinking  Drink enough fluid to keep your urine pale yellow. Staying hydrated may help prevent cramps.  Eat a healthy diet that includes plenty of nutrients to help your muscles function. A healthy diet includes fruits and vegetables, lean protein, whole grains, and low-fat or nonfat dairy products. Managing pain, stiffness, and swelling  Try massaging, stretching, and relaxing the affected muscle. Do this for several minutes at a time.  If directed, put ice on areas that are sore or painful after a cramp. To do this: ? Put ice in a plastic bag. ? Place a towel between your skin and the bag. ? Leave the ice on for 20 minutes, 2-3 times a day. ? Remove the ice if your skin turns bright red. This is very important. If you cannot feel pain, heat, or cold, you have a greater risk of damage to the area.  If directed, apply heat to muscles that are tense or tight. Do this before  you exercise, or as often as told by your health care provider. Use the heat source that your health care provider recommends, such as a moist heat pack or a heating pad. To do this: ? Place a towel between your skin and the heat source. ? Leave the heat on for 20-30 minutes. ? Remove the heat if your skin turns bright red. This is especially important if you are unable to feel pain, heat, or cold. You may have a greater risk of getting burned.  Try taking hot showers or baths to help relax tight muscles.      General instructions  If you are having frequent leg cramps, avoid intense exercise for several days.  Take over-the-counter and prescription medicines only as told by your health care provider.  Keep all follow-up visits. This is important. Contact a health care provider if:  Your leg cramps get more severe or more frequent, or they do not improve over time.  Your foot becomes cold, numb, or blue. Summary  Muscle cramps can develop in any muscle, but the most common place is in the calf muscles of the leg.  Leg cramps are painful, and they may last for a few seconds to a few minutes.  Usually, leg cramps are not caused by a serious medical problem. Often, the cause is not known.  Stay hydrated, and take over-the-counter and prescription medicines only as told by your health care provider. This information is not intended to replace advice given to you by your   health care provider. Make sure you discuss any questions you have with your health care provider. Document Revised: 02/13/2020 Document Reviewed: 02/13/2020 Elsevier Patient Education  2021 Elsevier Inc.  

## 2020-12-16 LAB — CBC WITH DIFFERENTIAL/PLATELET
Basophils Absolute: 0.1 10*3/uL (ref 0.0–0.2)
Basos: 1 %
EOS (ABSOLUTE): 0.1 10*3/uL (ref 0.0–0.4)
Eos: 2 %
Hematocrit: 43.6 % (ref 34.0–46.6)
Hemoglobin: 14.5 g/dL (ref 11.1–15.9)
Immature Grans (Abs): 0 10*3/uL (ref 0.0–0.1)
Immature Granulocytes: 0 %
Lymphocytes Absolute: 2.4 10*3/uL (ref 0.7–3.1)
Lymphs: 33 %
MCH: 28.3 pg (ref 26.6–33.0)
MCHC: 33.3 g/dL (ref 31.5–35.7)
MCV: 85 fL (ref 79–97)
Monocytes Absolute: 0.6 10*3/uL (ref 0.1–0.9)
Monocytes: 9 %
Neutrophils Absolute: 4 10*3/uL (ref 1.4–7.0)
Neutrophils: 55 %
Platelets: 223 10*3/uL (ref 150–450)
RBC: 5.12 x10E6/uL (ref 3.77–5.28)
RDW: 12.6 % (ref 11.7–15.4)
WBC: 7.2 10*3/uL (ref 3.4–10.8)

## 2020-12-16 LAB — CMP14+EGFR
ALT: 18 IU/L (ref 0–32)
AST: 20 IU/L (ref 0–40)
Albumin/Globulin Ratio: 1.9 (ref 1.2–2.2)
Albumin: 4.3 g/dL (ref 3.7–4.7)
Alkaline Phosphatase: 54 IU/L (ref 44–121)
BUN/Creatinine Ratio: 18 (ref 12–28)
BUN: 14 mg/dL (ref 8–27)
Bilirubin Total: <0.2 mg/dL (ref 0.0–1.2)
CO2: 23 mmol/L (ref 20–29)
Calcium: 9.6 mg/dL (ref 8.7–10.3)
Chloride: 103 mmol/L (ref 96–106)
Creatinine, Ser: 0.77 mg/dL (ref 0.57–1.00)
Globulin, Total: 2.3 g/dL (ref 1.5–4.5)
Glucose: 117 mg/dL — ABNORMAL HIGH (ref 65–99)
Potassium: 4.2 mmol/L (ref 3.5–5.2)
Sodium: 140 mmol/L (ref 134–144)
Total Protein: 6.6 g/dL (ref 6.0–8.5)
eGFR: 81 mL/min/{1.73_m2} (ref >59)

## 2021-01-19 ENCOUNTER — Encounter: Payer: Self-pay | Admitting: Family Medicine

## 2021-01-19 ENCOUNTER — Other Ambulatory Visit: Payer: Self-pay

## 2021-01-19 ENCOUNTER — Ambulatory Visit (INDEPENDENT_AMBULATORY_CARE_PROVIDER_SITE_OTHER): Payer: Medicare Other | Admitting: Family Medicine

## 2021-01-19 VITALS — BP 128/78 | HR 77 | Ht 59.0 in | Wt 147.0 lb

## 2021-01-19 DIAGNOSIS — I10 Essential (primary) hypertension: Secondary | ICD-10-CM | POA: Diagnosis not present

## 2021-01-19 DIAGNOSIS — E782 Mixed hyperlipidemia: Secondary | ICD-10-CM | POA: Diagnosis not present

## 2021-01-19 DIAGNOSIS — M8000XS Age-related osteoporosis with current pathological fracture, unspecified site, sequela: Secondary | ICD-10-CM | POA: Diagnosis not present

## 2021-01-19 DIAGNOSIS — R7303 Prediabetes: Secondary | ICD-10-CM | POA: Diagnosis not present

## 2021-01-19 DIAGNOSIS — M810 Age-related osteoporosis without current pathological fracture: Secondary | ICD-10-CM | POA: Diagnosis not present

## 2021-01-19 DIAGNOSIS — R6889 Other general symptoms and signs: Secondary | ICD-10-CM | POA: Diagnosis not present

## 2021-01-19 DIAGNOSIS — M1990 Unspecified osteoarthritis, unspecified site: Secondary | ICD-10-CM | POA: Diagnosis not present

## 2021-01-19 LAB — BAYER DCA HB A1C WAIVED: HB A1C (BAYER DCA - WAIVED): 5.9 % (ref <7.0)

## 2021-01-19 NOTE — Progress Notes (Signed)
BP 128/78   Pulse 77   Ht 4\' 11"  (1.499 m)   Wt 147 lb (66.7 kg)   SpO2 98%   BMI 29.69 kg/m    Subjective:   Patient ID: [name], female    DOB: [birth year], 75 y.o.   MRN: [id number]  HPI: [name] is a 75 y.o. female presenting on 01/19/2021 for Hyperlipidemia, Hypertension, and Medical Management of Chronic Issues   HPI Osteoporosis/osteopenia Fractures or history of fracture: None Medication: Alendronate Duration of treatment: Since November 2020, a year and a half Last bone density scan: October 2020 Last T score: -2.6  Hypertension Patient is currently on no medication currently, and their blood pressure today is 128/78. Patient denies any lightheadedness or dizziness. Patient denies headaches, blurred vision, chest pains, shortness of breath, or weakness. Denies any side effects from medication and is content with current medication.   Hyperlipidemia Patient is coming in for recheck of his hyperlipidemia. The patient is currently taking pravastatin 3 times a week. They deny any issues with myalgias or history of liver damage from it. They deny any focal numbness or weakness or chest pain.   Patient does still get the occasional dizziness.  She is keeping an eye on her blood pressures.  She has not fallen because of it.  She also complains of some numbness on the outside of her left foot that has been there for quite some time.  Relevant past medical, surgical, family and social history reviewed and updated as indicated. Interim medical history since our last visit reviewed. Allergies and medications reviewed and updated.  Review of Systems  Constitutional: Negative for chills and fever.  Eyes: Negative for visual disturbance.  Respiratory: Negative for chest tightness and shortness of breath.   Cardiovascular: Negative for chest pain and leg swelling.  Musculoskeletal: Negative for back pain and gait problem.  Skin: Negative for rash.  Neurological:  Negative for dizziness, light-headedness and headaches.  Psychiatric/Behavioral: Negative for agitation and behavioral problems.  All other systems reviewed and are negative.   Per HPI unless specifically indicated above   Allergies as of 01/19/2021      Reactions   Poison Ivy Extract Rash   Benadryl [diphenhydramine Hcl]    Neomycin-polymyxin B Gu Other (See Comments)   unknown   Nsaids Hives   Teriparatide Hives      Medication List       Accurate as of January 19, 2021 11:29 AM. If you have any questions, ask your nurse or doctor.        acetaminophen 500 MG tablet Commonly known as: TYLENOL Take 650 mg by mouth as needed.   alendronate 70 MG tablet Commonly known as: FOSAMAX TAKE 1 TABLET BY MOUTH  EVERY 7 DAYS WITH A FULL  GLASS OF WATER ON AN EMPTY  STOMACH   aluminum hydroxide-magnesium carbonate 95-358 MG/15ML Susp Commonly known as: GAVISCON Take by mouth.   Calcium 1000 + D 1000-800 MG-UNIT Tabs Generic drug: Calcium Carb-Cholecalciferol Take 600 mg by mouth daily.   cholecalciferol 25 MCG (1000 UNIT) tablet Commonly known as: VITAMIN D3 Take 1,000 Units by mouth daily.   co-enzyme Q-10 30 MG capsule Take 30 mg by mouth daily.   famotidine 20 MG tablet Commonly known as: PEPCID Take 20 mg by mouth 2 (two) times daily.   Fish Oil 1000 MG Caps Take 1,200 mg by mouth in the morning and at bedtime.   HEALTHY LIVER PO Take by mouth.  hyoscyamine 0.375 MG 12 hr tablet Commonly known as: LEVBID Take 1 tablet (0.375 mg total) by mouth 2 (two) times daily.   hyoscyamine 0.125 MG SL tablet Commonly known as: LEVSIN SL DISSOLVE 1 TABLET UNDER THE TONGUE EVERY 8 HOURS AS NEEDED   Magnesium 500 MG Caps Take 500 mg by mouth.   multivitamin capsule Take 1 capsule by mouth daily.   NASACORT ALLERGY 24HR NA Place 1 spray into the nose daily.   pravastatin 20 MG tablet Commonly known as: PRAVACHOL Take 1 tablet (20 mg total) by mouth 3 (three) times  a week.   psyllium 58.6 % packet Commonly known as: METAMUCIL Take 1 packet by mouth 2 (two) times daily.   TURMERIC PO Take 2,000 mg by mouth in the morning and at bedtime.        Objective:   BP 128/78   Pulse 77   Ht 4\' 11"  (1.499 m)   Wt 147 lb (66.7 kg)   SpO2 98%   BMI 29.69 kg/m   Wt Readings from Last 3 Encounters:  01/19/21 147 lb (66.7 kg)  12/15/20 146 lb 4 oz (66.3 kg)  04/23/20 149 lb (67.6 kg)    Physical Exam Vitals and nursing note reviewed.  Constitutional:      General: She is not in acute distress.    Appearance: She is well-developed. She is not diaphoretic.  Eyes:     Conjunctiva/sclera: Conjunctivae normal.  Cardiovascular:     Rate and Rhythm: Normal rate and regular rhythm.     Heart sounds: Normal heart sounds. No murmur heard.   Pulmonary:     Effort: Pulmonary effort is normal. No respiratory distress.     Breath sounds: Normal breath sounds. No wheezing.  Musculoskeletal:        General: No tenderness. Normal range of motion.  Skin:    General: Skin is warm and dry.     Findings: No rash.  Neurological:     Mental Status: She is alert and oriented to person, place, and time.     Coordination: Coordination normal.  Psychiatric:        Behavior: Behavior normal.       Assessment & Plan:   Problem List Items Addressed This Visit      Cardiovascular and Mediastinum   Hypertension - Primary     Musculoskeletal and Integument   Osteoporosis     Other   HLD (hyperlipidemia)   Relevant Orders   Lipid panel    Other Visit Diagnoses    Prediabetes       Relevant Orders   Bayer DCA Hb A1c Waived      Continue current medication, will follow up with blood work.  Follow-up of dizziness but monitor for now, monitor blood pressures.  She is not on any blood pressure medication and sounds most like vertigo.  Will watch closely. Follow up plan: Return in about 6 months (around 07/21/2021), or if symptoms worsen or fail to  improve, for Hypertension and cholesterol and prediabetes.  Counseling provided for all of the vaccine components Orders Placed This Encounter  Procedures  . Lipid panel  . Bayer [facility] Hb A1c [place], MD [facility] 01/19/2021, 11:29 AM

## 2021-01-20 LAB — LIPID PANEL
Chol/HDL Ratio: 3.9 ratio (ref 0.0–4.4)
Cholesterol, Total: 240 mg/dL — ABNORMAL HIGH (ref 100–199)
HDL: 62 mg/dL (ref >39)
LDL Chol Calc (NIH): 142 mg/dL — ABNORMAL HIGH (ref 0–99)
Triglycerides: 203 mg/dL — ABNORMAL HIGH (ref 0–149)
VLDL Cholesterol Cal: 36 mg/dL (ref 5–40)

## 2021-01-21 LAB — SPECIMEN STATUS REPORT

## 2021-01-21 LAB — VITAMIN B12: Vitamin B-12: 925 pg/mL (ref 232–1245)

## 2021-01-21 LAB — VITAMIN D 25 HYDROXY (VIT D DEFICIENCY, FRACTURES): Vit D, 25-Hydroxy: 51.8 ng/mL (ref 30.0–100.0)

## 2021-02-23 ENCOUNTER — Other Ambulatory Visit: Payer: Self-pay | Admitting: *Deleted

## 2021-02-23 MED ORDER — PRAVASTATIN SODIUM 20 MG PO TABS: 20.0000 mg | ORAL_TABLET | ORAL | 1 refills | Status: DC

## 2021-03-03 DIAGNOSIS — M25551 Pain in right hip: Secondary | ICD-10-CM | POA: Diagnosis not present

## 2021-03-05 ENCOUNTER — Other Ambulatory Visit: Payer: Self-pay | Admitting: Family Medicine

## 2021-03-26 ENCOUNTER — Other Ambulatory Visit: Payer: Self-pay

## 2021-03-26 ENCOUNTER — Ambulatory Visit (INDEPENDENT_AMBULATORY_CARE_PROVIDER_SITE_OTHER): Payer: Medicare Other | Admitting: Pharmacist

## 2021-03-26 DIAGNOSIS — M8000XS Age-related osteoporosis with current pathological fracture, unspecified site, sequela: Secondary | ICD-10-CM

## 2021-03-26 DIAGNOSIS — M810 Age-related osteoporosis without current pathological fracture: Secondary | ICD-10-CM

## 2021-03-26 DIAGNOSIS — E782 Mixed hyperlipidemia: Secondary | ICD-10-CM | POA: Diagnosis not present

## 2021-03-26 MED ORDER — PRAVASTATIN SODIUM 20 MG PO TABS: 20.0000 mg | ORAL_TABLET | Freq: Every evening | ORAL | 2 refills | Status: DC

## 2021-03-26 MED ORDER — FAMOTIDINE 10 MG PO TABS: 10.0000 mg | ORAL_TABLET | Freq: Every day | ORAL | 3 refills | Status: DC

## 2021-03-26 NOTE — Progress Notes (Signed)
03/26/2021 Name: [name] MRN: [id number] DOB: [birth year]   S:  23 yoF Presents for medication management and review Insurance coverage/medication affordability: [place]  Patient reports adherence with medications.  She denies side effects.  She is tolerating her medications well. Current Outpatient Medications on File Prior to Visit  Medication Sig Dispense Refill   Misc Natural Products (OSTEO BI-FLEX JOINT SHIELD PO) Take by mouth in the morning and at bedtime.     Zinc 50 MG CAPS Take 50 mg by mouth daily.     acetaminophen (TYLENOL) 500 MG tablet Take 650 mg by mouth as needed.      alendronate (FOSAMAX) 70 MG tablet TAKE 1 TABLET BY MOUTH  EVERY 7 DAYS WITH A FULL  GLASS OF WATER ON AN EMPTY  STOMACH 12 tablet 3   aluminum hydroxide-magnesium carbonate (GAVISCON) 95-358 MG/15ML SUSP Take by mouth.     Calcium Carb-Cholecalciferol (CALCIUM 1000 + D) 1000-800 MG-UNIT TABS Take 600 mg by mouth daily.     cholecalciferol (VITAMIN D3) 25 MCG (1000 UNIT) tablet Take 1,000 Units by mouth daily.     Coenzyme Q10 200 MG capsule Take 200 mg by mouth daily.     hyoscyamine (LEVBID) 0.375 MG 12 hr tablet Take 1 tablet (0.375 mg total) by mouth 2 (two) times daily. 180 tablet 3   hyoscyamine (LEVSIN SL) 0.125 MG SL tablet DISSOLVE 1 TABLET UNDER THE TONGUE EVERY 8 HOURS AS NEEDED 90 tablet 0   Magnesium 500 MG CAPS Take 500 mg by mouth.     Multiple Vitamin (MULTIVITAMIN) capsule Take 1 capsule by mouth daily.     Omega-3 Fatty Acids (FISH OIL) 1000 MG CAPS Take 1,200 mg by mouth in the morning and at bedtime.      psyllium (METAMUCIL) 58.6 % packet Take 1 packet by mouth 2 (two) times daily.     Triamcinolone Acetonide (NASACORT ALLERGY 24HR NA) Place 1 spray into the nose daily.     TURMERIC PO Take 2,000 mg by mouth in the morning and at bedtime.      No current facility-administered medications on file prior to visit.      O:  Lab Results  Component Value Date   HGBA1C  5.9 01/19/2021      Lipid Panel     Component Value Date/Time   CHOL 240 (H) 01/19/2021 1154   TRIG 203 (H) 01/19/2021 1154   TRIG 122 01/17/2015 0924   HDL 62 01/19/2021 1154   HDL 65 01/17/2015 0924   CHOLHDL 3.9 01/19/2021 1154   CHOLHDL 4 12/07/2012 1008   VLDL 21.8 12/07/2012 1008   LDLCALC 142 (H) 01/19/2021 1154   LDLDIRECT 155.9 12/07/2012 1008      A/P:  -Continue taking medications as prescribed  -Continue taking Fosamax once weekly as below:  Take only upon rising for the day; do not take at bedtime or before arising  Take at least one-half hour before the first food, beverage, or medication of the day [ (Tablet) Swallow tablet with 6 to 8 ounces plain water -discussed side effects and purpose of medications; medication list updated -Patient is now able to take statin daily; RX changed and [facility] per patient request -sent H2RA (famotidine) to patient's mail order pharmacy to see if cheaper   Written patient instructions provided.  Total time in face to face counseling 25 minutes.     Regina Eck, PharmD, [place] Clinical Pharmacist, Tristan Schroeder  [place]  [facility] Phone [phone]

## 2021-04-09 ENCOUNTER — Other Ambulatory Visit: Payer: Self-pay | Admitting: Family Medicine

## 2021-04-25 ENCOUNTER — Other Ambulatory Visit: Payer: Self-pay | Admitting: Family Medicine

## 2021-04-25 DIAGNOSIS — M8000XS Age-related osteoporosis with current pathological fracture, unspecified site, sequela: Secondary | ICD-10-CM

## 2021-06-17 ENCOUNTER — Encounter: Payer: Self-pay | Admitting: Family Medicine

## 2021-06-17 ENCOUNTER — Ambulatory Visit (INDEPENDENT_AMBULATORY_CARE_PROVIDER_SITE_OTHER): Payer: Medicare Other | Admitting: Family Medicine

## 2021-06-17 DIAGNOSIS — R059 Cough, unspecified: Secondary | ICD-10-CM | POA: Diagnosis not present

## 2021-06-17 DIAGNOSIS — J0140 Acute pansinusitis, unspecified: Secondary | ICD-10-CM | POA: Diagnosis not present

## 2021-06-17 MED ORDER — PREDNISONE 10 MG (21) PO TBPK: ORAL_TABLET | ORAL | 0 refills | Status: DC

## 2021-06-17 MED ORDER — AZITHROMYCIN 250 MG PO TABS: ORAL_TABLET | ORAL | 0 refills | Status: DC

## 2021-06-17 NOTE — Addendum Note (Signed)
Addended by: Rolland Bimler on: 06/17/2021 12:49 PM   Modules accepted: Orders
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
## 2021-06-18 LAB — SARS-COV-2, NAA 2 DAY TAT

## 2021-06-18 LAB — NOVEL CORONAVIRUS, NAA: SARS-CoV-2, NAA: NOT DETECTED

## 2021-06-19 ENCOUNTER — Telehealth: Payer: Self-pay | Admitting: Family Medicine

## 2021-06-19 DIAGNOSIS — I10 Essential (primary) hypertension: Secondary | ICD-10-CM

## 2021-06-19 DIAGNOSIS — E782 Mixed hyperlipidemia: Secondary | ICD-10-CM

## 2021-06-19 DIAGNOSIS — M8000XS Age-related osteoporosis with current pathological fracture, unspecified site, sequela: Secondary | ICD-10-CM

## 2021-06-19 NOTE — Telephone Encounter (Signed)
Please add labs so pt can come in before appt. Call when they are in.

## 2021-06-23 NOTE — Telephone Encounter (Signed)
Placed lab orders for the patient 

## 2021-06-24 NOTE — Telephone Encounter (Signed)
Left message informing pt that labs have been ordered.

## 2021-07-14 ENCOUNTER — Other Ambulatory Visit: Payer: Medicare Other

## 2021-07-14 ENCOUNTER — Other Ambulatory Visit: Payer: Self-pay

## 2021-07-14 DIAGNOSIS — I10 Essential (primary) hypertension: Secondary | ICD-10-CM

## 2021-07-14 DIAGNOSIS — M8000XS Age-related osteoporosis with current pathological fracture, unspecified site, sequela: Secondary | ICD-10-CM | POA: Diagnosis not present

## 2021-07-14 DIAGNOSIS — E782 Mixed hyperlipidemia: Secondary | ICD-10-CM | POA: Diagnosis not present

## 2021-07-14 DIAGNOSIS — M810 Age-related osteoporosis without current pathological fracture: Secondary | ICD-10-CM | POA: Diagnosis not present

## 2021-07-15 LAB — CBC WITH DIFFERENTIAL/PLATELET
Basophils Absolute: 0.1 10*3/uL (ref 0.0–0.2)
Basos: 1 %
EOS (ABSOLUTE): 0.1 10*3/uL (ref 0.0–0.4)
Eos: 2 %
Hematocrit: 42.3 % (ref 34.0–46.6)
Hemoglobin: 14.1 g/dL (ref 11.1–15.9)
Immature Grans (Abs): 0 10*3/uL (ref 0.0–0.1)
Immature Granulocytes: 0 %
Lymphocytes Absolute: 2 10*3/uL (ref 0.7–3.1)
Lymphs: 39 %
MCH: 28.4 pg (ref 26.6–33.0)
MCHC: 33.3 g/dL (ref 31.5–35.7)
MCV: 85 fL (ref 79–97)
Monocytes Absolute: 0.5 10*3/uL (ref 0.1–0.9)
Monocytes: 9 %
Neutrophils Absolute: 2.4 10*3/uL (ref 1.4–7.0)
Neutrophils: 49 %
Platelets: 232 10*3/uL (ref 150–450)
RBC: 4.96 x10E6/uL (ref 3.77–5.28)
RDW: 12.9 % (ref 11.7–15.4)
WBC: 5 10*3/uL (ref 3.4–10.8)

## 2021-07-15 LAB — LIPID PANEL
Chol/HDL Ratio: 2.8 ratio (ref 0.0–4.4)
Cholesterol, Total: 197 mg/dL (ref 100–199)
HDL: 70 mg/dL (ref >39)
LDL Chol Calc (NIH): 108 mg/dL — ABNORMAL HIGH (ref 0–99)
Triglycerides: 107 mg/dL (ref 0–149)
VLDL Cholesterol Cal: 19 mg/dL (ref 5–40)

## 2021-07-15 LAB — CMP14+EGFR
ALT: 16 IU/L (ref 0–32)
AST: 19 IU/L (ref 0–40)
Albumin/Globulin Ratio: 2.6 — ABNORMAL HIGH (ref 1.2–2.2)
Albumin: 4.6 g/dL (ref 3.7–4.7)
Alkaline Phosphatase: 45 IU/L (ref 44–121)
BUN/Creatinine Ratio: 14 (ref 12–28)
BUN: 13 mg/dL (ref 8–27)
Bilirubin Total: 0.4 mg/dL (ref 0.0–1.2)
CO2: 24 mmol/L (ref 20–29)
Calcium: 9.9 mg/dL (ref 8.7–10.3)
Chloride: 102 mmol/L (ref 96–106)
Creatinine, Ser: 0.9 mg/dL (ref 0.57–1.00)
Globulin, Total: 1.8 g/dL (ref 1.5–4.5)
Glucose: 94 mg/dL (ref 70–99)
Potassium: 5.6 mmol/L — ABNORMAL HIGH (ref 3.5–5.2)
Sodium: 142 mmol/L (ref 134–144)
Total Protein: 6.4 g/dL (ref 6.0–8.5)
eGFR: 67 mL/min/{1.73_m2} (ref >59)

## 2021-07-15 LAB — VITAMIN D 25 HYDROXY (VIT D DEFICIENCY, FRACTURES): Vit D, 25-Hydroxy: 78 ng/mL (ref 30.0–100.0)

## 2021-07-22 ENCOUNTER — Other Ambulatory Visit: Payer: Self-pay

## 2021-07-22 ENCOUNTER — Encounter: Payer: Self-pay | Admitting: Family Medicine

## 2021-07-22 ENCOUNTER — Ambulatory Visit (INDEPENDENT_AMBULATORY_CARE_PROVIDER_SITE_OTHER): Payer: Medicare Other | Admitting: Family Medicine

## 2021-07-22 VITALS — BP 141/80 | HR 71 | Ht 59.0 in | Wt 146.0 lb

## 2021-07-22 DIAGNOSIS — K219 Gastro-esophageal reflux disease without esophagitis: Secondary | ICD-10-CM | POA: Diagnosis not present

## 2021-07-22 DIAGNOSIS — E782 Mixed hyperlipidemia: Secondary | ICD-10-CM

## 2021-07-22 DIAGNOSIS — I10 Essential (primary) hypertension: Secondary | ICD-10-CM

## 2021-07-22 NOTE — Progress Notes (Signed)
BP (!) 141/80   Pulse 71   Ht 4\' 11"  (1.499 m)   Wt 146 lb (66.2 kg)   SpO2 97%   BMI 29.49 kg/m    Subjective:   Patient ID: [name], female    DOB: Dec 10, 1945, 75 y.o.   MRN: [id number]  HPI: [name] is a 75 y.o. female presenting on 07/22/2021 for Medical Management of Chronic Issues, Hyperlipidemia, and Hypertension   HPI Hypertension Patient is currently on no medication currently, diet controlled, and their blood pressure today is 141/80. Patient denies any lightheadedness or dizziness. Patient denies headaches, blurred vision, chest pains, shortness of breath, or weakness. Denies any side effects from medication and is content with current medication.   Hyperlipidemia Patient is coming in for recheck of his hyperlipidemia. The patient is currently taking fish oil and pravastatin. They deny any issues with myalgias or history of liver damage from it. They deny any focal numbness or weakness or chest pain.   GERD Patient is currently on I have reviewed and agree with the above Pepcid.  She denies any major symptoms or abdominal pain or belching or burping. She denies any blood in her stool or lightheadedness or dizziness.   Relevant past medical, surgical, family and social history reviewed and updated as indicated. Interim medical history since our last visit reviewed. Allergies and medications reviewed and updated.  Review of Systems  Constitutional:  Negative for chills and fever.  HENT:  Negative for congestion, ear discharge and ear pain.   Eyes:  Negative for redness and visual disturbance.  Respiratory:  Negative for chest tightness and shortness of breath.   Cardiovascular:  Negative for chest pain and leg swelling.  Genitourinary:  Negative for difficulty urinating and dysuria.  Musculoskeletal:  Negative for back pain and gait problem.  Skin:  Negative for rash.  Neurological:  Negative for light-headedness and headaches.  Psychiatric/Behavioral:   Negative for agitation and behavioral problems.   All other systems reviewed and are negative.  Per HPI unless specifically indicated above   Allergies as of 07/22/2021       Reactions   Poison Ivy Extract Rash   Benadryl [diphenhydramine Hcl]    Neomycin-polymyxin B Gu Other (See Comments)   unknown   Nsaids Hives   Teriparatide Hives        Medication List        Accurate as of July 22, 2021 11:19 AM. If you have any questions, ask your nurse or doctor.          STOP taking these medications    azithromycin 250 MG tablet Commonly known as: Zithromax Z-Pak Stopped by: Elige Radon Eleora Sutherland, MD   predniSONE 10 MG (21) Tbpk tablet Commonly known as: STERAPRED UNI-PAK 21 TAB Stopped by: Elige Radon Alissa Pharr, MD       TAKE these medications    acetaminophen 500 MG tablet Commonly known as: TYLENOL Take 650 mg by mouth as needed.   alendronate 70 MG tablet Commonly known as: FOSAMAX TAKE 1 TABLET BY MOUTH  EVERY 7 DAYS WITH A FULL  GLASS OF WATER ON AN EMPTY  STOMACH   aluminum hydroxide-magnesium carbonate 95-358 MG/15ML Susp Commonly known as: GAVISCON Take by mouth.   Calcium 1000 + D 1000-800 MG-UNIT Tabs Generic drug: Calcium Carb-Cholecalciferol Take 600 mg by mouth daily.   cholecalciferol 25 MCG (1000 UNIT) tablet Commonly known as: VITAMIN D3 Take 1,000 Units by mouth daily.   Coenzyme Q10 200 MG  capsule Take 200 mg by mouth daily.   famotidine 10 MG tablet Commonly known as: PEPCID Take 1 tablet (10 mg total) by mouth daily.   Fish Oil 1000 MG Caps Take 1,200 mg by mouth in the morning and at bedtime.   hyoscyamine 0.375 MG 12 hr tablet Commonly known as: LEVBID Take 1 tablet (0.375 mg total) by mouth 2 (two) times daily.   hyoscyamine 0.125 MG SL tablet Commonly known as: LEVSIN SL DISSOLVE 1 TABLET IN MOUTH EVERY 8 HOURS AS NEEDED   Magnesium 500 MG Caps Take 500 mg by mouth.   multivitamin capsule Take 1 capsule by mouth  daily.   NASACORT ALLERGY 24HR NA Place 1 spray into the nose daily.   OSTEO BI-FLEX JOINT SHIELD PO Take by mouth in the morning and at bedtime.   pravastatin 20 MG tablet Commonly known as: PRAVACHOL Take 1 tablet (20 mg total) by mouth at bedtime.   psyllium 58.6 % packet Commonly known as: METAMUCIL Take 1 packet by mouth 2 (two) times daily.   TURMERIC PO Take 2,000 mg by mouth in the morning and at bedtime.   Zinc 50 MG Caps Take 50 mg by mouth daily.         Objective:   BP (!) 141/80   Pulse 71   Ht 4\' 11"  (1.499 m)   Wt 146 lb (66.2 kg)   SpO2 97%   BMI 29.49 kg/m   Wt Readings from Last 3 Encounters:  07/22/21 146 lb (66.2 kg)  01/19/21 147 lb (66.7 kg)  12/15/20 146 lb 4 oz (66.3 kg)    Physical Exam Vitals and nursing note reviewed.  Constitutional:      General: She is not in acute distress.    Appearance: She is well-developed. She is not diaphoretic.  Eyes:     Conjunctiva/sclera: Conjunctivae normal.  Cardiovascular:     Rate and Rhythm: Normal rate and regular rhythm.     Heart sounds: Normal heart sounds. No murmur heard. Pulmonary:     Effort: Pulmonary effort is normal. No respiratory distress.     Breath sounds: Normal breath sounds. No wheezing.  Musculoskeletal:        General: No tenderness. Normal range of motion.  Skin:    General: Skin is warm and dry.     Findings: No rash.  Neurological:     Mental Status: She is alert and oriented to person, place, and time.     Coordination: Coordination normal.  Psychiatric:        Behavior: Behavior normal.    Results for orders placed or performed in visit on 07/14/21  VITAMIN D 25 Hydroxy (Vit-D Deficiency, Fractures)  Result Value Ref Range   Vit D, 25-Hydroxy 78.0 30.0 - 100.0 ng/mL  Lipid panel  Result Value Ref Range   Cholesterol, Total 197 100 - 199 mg/dL   Triglycerides 161 0 - 149 mg/dL   HDL 70 >09 mg/dL   VLDL Cholesterol Cal 19 5 - 40 mg/dL   LDL Chol Calc (NIH)  108 (H) 0 - 99 mg/dL   Chol/HDL Ratio 2.8 0.0 - 4.4 ratio  CMP14+EGFR  Result Value Ref Range   Glucose 94 70 - 99 mg/dL   BUN 13 8 - 27 mg/dL   Creatinine, Ser 6.04 0.57 - 1.00 mg/dL   eGFR 67 >54 UJ/WJX/9.14   BUN/Creatinine Ratio 14 12 - 28   Sodium 142 134 - 144 mmol/L   Potassium 5.6 (H) 3.5 -  5.2 mmol/L   Chloride 102 96 - 106 mmol/L   CO2 24 20 - 29 mmol/L   Calcium 9.9 8.7 - 10.3 mg/dL   Total Protein 6.4 6.0 - 8.5 g/dL   Albumin 4.6 3.7 - 4.7 g/dL   Globulin, Total 1.8 1.5 - 4.5 g/dL   Albumin/Globulin Ratio 2.6 (H) 1.2 - 2.2   Bilirubin Total 0.4 0.0 - 1.2 mg/dL   Alkaline Phosphatase 45 44 - 121 IU/L   AST 19 0 - 40 IU/L   ALT 16 0 - 32 IU/L  CBC with Differential/Platelet  Result Value Ref Range   WBC 5.0 3.4 - 10.8 x10E3/uL   RBC 4.96 3.77 - 5.28 x10E6/uL   Hemoglobin 14.1 11.1 - 15.9 g/dL   Hematocrit 16.1 09.6 - 46.6 %   MCV 85 79 - 97 fL   MCH 28.4 26.6 - 33.0 pg   MCHC 33.3 31.5 - 35.7 g/dL   RDW 04.5 40.9 - 81.1 %   Platelets 232 150 - 450 x10E3/uL   Neutrophils 49 Not Estab. %   Lymphs 39 Not Estab. %   Monocytes 9 Not Estab. %   Eos 2 Not Estab. %   Basos 1 Not Estab. %   Neutrophils Absolute 2.4 1.4 - 7.0 x10E3/uL   Lymphocytes Absolute 2.0 0.7 - 3.1 x10E3/uL   Monocytes Absolute 0.5 0.1 - 0.9 x10E3/uL   EOS (ABSOLUTE) 0.1 0.0 - 0.4 x10E3/uL   Basophils Absolute 0.1 0.0 - 0.2 x10E3/uL   Immature Granulocytes 0 Not Estab. %   Immature Grans (Abs) 0.0 0.0 - 0.1 x10E3/uL    Assessment & Plan:   Problem List Items Addressed This Visit       Cardiovascular and Mediastinum   Hypertension     Digestive   GERD (gastroesophageal reflux disease)     Other   HLD (hyperlipidemia) - Primary    Continue current medicine, will check blood work today.  Blood pressure looks decent for her age allowing permissive hypertension. Recommended to increase her acid reflux medicine to twice a day for at least the next 3 to 4 weeks Follow up plan: Return in  about 6 months (around 01/20/2022), or if symptoms worsen or fail to improve, for Hypertension hyperlipidemia GERD.  Counseling provided for all of the vaccine components No orders of the defined types were placed in this encounter.   Arville Care, MD [facility] 07/22/2021, 11:19 AM

## 2021-07-24 DIAGNOSIS — M8588 Other specified disorders of bone density and structure, other site: Secondary | ICD-10-CM | POA: Diagnosis not present

## 2021-07-24 DIAGNOSIS — Z1231 Encounter for screening mammogram for malignant neoplasm of breast: Secondary | ICD-10-CM | POA: Diagnosis not present

## 2021-07-24 DIAGNOSIS — M810 Age-related osteoporosis without current pathological fracture: Secondary | ICD-10-CM | POA: Diagnosis not present

## 2021-07-30 ENCOUNTER — Ambulatory Visit (INDEPENDENT_AMBULATORY_CARE_PROVIDER_SITE_OTHER): Payer: Medicare Other | Admitting: Family Medicine

## 2021-07-30 ENCOUNTER — Encounter: Payer: Self-pay | Admitting: Family Medicine

## 2021-07-30 DIAGNOSIS — J069 Acute upper respiratory infection, unspecified: Secondary | ICD-10-CM | POA: Diagnosis not present

## 2021-07-30 LAB — VERITOR FLU A/B WAIVED
Influenza A: NEGATIVE
Influenza B: NEGATIVE

## 2021-07-30 NOTE — Progress Notes (Signed)
Subjective:    Patient ID: [name], female    DOB: 1946/04/29, 75 y.o.   MRN: [id number]   HPI: [name] is a 75 y.o. female presenting for exposure to Covid. Daughter on Paxlovid for Covid. Having HA for 1 day. At left temple. Mild, off and on. Currently not present. Some congestion. Home tests have been negative. No sore throat or ear ache. No fever.No cough.   Depression screen [facility] 2/9 07/22/2021 01/19/2021 12/15/2020 04/23/2020 03/04/2020  Decreased Interest 0 0 0 0 0  Down, Depressed, Hopeless 0 0 0 0 0  PHQ - 2 Score 0 0 0 0 0  Altered sleeping 0 - - - -  Tired, decreased energy 0 - - - -  Change in appetite 0 - - - -  Feeling bad or failure about yourself  0 - - - -  Trouble concentrating 0 - - - -  Moving slowly or fidgety/restless 0 - - - -  Suicidal thoughts 0 - - - -  PHQ-9 Score - - - - -     Relevant past medical, surgical, family and social history reviewed and updated as indicated.  Interim medical history since our last visit reviewed. Allergies and medications reviewed and updated.  ROS:  Review of Systems  Constitutional:  Negative for appetite change, chills, diaphoresis, fatigue and fever.  HENT:  Negative for congestion, ear pain, hearing loss, postnasal drip, rhinorrhea, sore throat and trouble swallowing.   Respiratory:  Negative for cough, chest tightness and shortness of breath.   Cardiovascular:  Negative for chest pain and palpitations.  Gastrointestinal:  Negative for abdominal pain.  Skin:  Negative for rash.    Social History   Tobacco Use  Smoking Status Never  Smokeless Tobacco Never       Objective:     Wt Readings from Last 3 Encounters:  07/22/21 146 lb (66.2 kg)  01/19/21 147 lb (66.7 kg)  12/15/20 146 lb 4 oz (66.3 kg)     Exam deferred. Pt. Harboring due to COVID 19. Phone visit performed.   Assessment & Plan:   1. Viral URI     No orders of the defined types were placed in this encounter.   Orders  Placed This Encounter  Procedures   Novel Coronavirus, NAA ([facility])    Order Specific Question:   Previously tested for COVID-19    Answer:   Yes    Order Specific Question:   Resident in a congregate (group) care setting    Answer:   No    Order Specific Question:   Is the patient student?    Answer:   No    Order Specific Question:   Employed in healthcare setting    Answer:   No    Order Specific Question:   Pregnant    Answer:   No    Order Specific Question:   Has patient completed COVID vaccination(s) (2 doses of Pfizer/Moderna 1 dose of [place])    Answer:   Unknown    Order Specific Question:   Release to patient    Answer:   Immediate   Veritor Flu A/B Waived    Order Specific Question:   Source    Answer:   nasal      Diagnoses and all orders for this visit:  Viral URI -     Novel Coronavirus, NAA ([facility]) -     Veritor Flu A/B [employer]  Visit via telephone Note  I discussed the limitations, risks, security and privacy concerns of performing an evaluation and management service by telephone and the availability of in person appointments. The patient was identified with two identifiers. Pt.expressed understanding and agreed to proceed. Pt. Is at home. Dr. Livia Snellen is in his office.  Follow Up Instructions:   I discussed the assessment and treatment plan with the patient. The patient was provided an opportunity to ask questions and all were answered. The patient agreed with the plan and demonstrated an understanding of the instructions.   The patient was advised to call back or seek an in-person evaluation if the symptoms worsen or if the condition fails to improve as anticipated.   Total minutes including chart review and phone contact time: 3   Follow up plan: Return if symptoms worsen or fail to improve.  Claretta Fraise, MD [place]

## 2021-07-31 ENCOUNTER — Telehealth: Payer: Self-pay | Admitting: Family Medicine

## 2021-07-31 ENCOUNTER — Other Ambulatory Visit: Payer: Self-pay

## 2021-07-31 LAB — NOVEL CORONAVIRUS, NAA: SARS-CoV-2, NAA: NOT DETECTED

## 2021-07-31 LAB — SARS-COV-2, NAA 2 DAY TAT

## 2021-07-31 MED ORDER — MOLNUPIRAVIR EUA 200MG CAPSULE: 4.0000 | ORAL_CAPSULE | Freq: Two times a day (BID) | ORAL | 0 refills | Status: AC

## 2021-07-31 NOTE — Telephone Encounter (Signed)
Pt has been made aware that Dr. Warrick Parisian has sent in antiviral for her to [facility] in [place]. She may use Mucinex, Tylenol or IBU if needed.

## 2021-08-03 ENCOUNTER — Telehealth: Payer: Self-pay | Admitting: Family Medicine

## 2021-08-03 NOTE — Telephone Encounter (Signed)
Lmtcb ? ?Need more information  ?

## 2021-08-10 ENCOUNTER — Encounter: Payer: Self-pay | Admitting: Family Medicine

## 2021-08-18 ENCOUNTER — Other Ambulatory Visit: Payer: Self-pay | Admitting: Family Medicine

## 2021-08-18 DIAGNOSIS — M8000XS Age-related osteoporosis with current pathological fracture, unspecified site, sequela: Secondary | ICD-10-CM

## 2021-10-06 ENCOUNTER — Encounter: Payer: Self-pay | Admitting: Gastroenterology

## 2021-10-06 ENCOUNTER — Telehealth: Payer: Self-pay | Admitting: Family Medicine

## 2021-10-06 NOTE — Telephone Encounter (Signed)
Pt called to let Dr Dettinger know that she stopped taking the Fosamax about 2 mths ago and is wanting to switch to Prolia.  Says her sister did that and has had good results with it so she now wants to do it.

## 2021-10-07 NOTE — Telephone Encounter (Signed)
Left detailed message Dettinger off till next week.

## 2021-10-14 ENCOUNTER — Other Ambulatory Visit: Payer: Self-pay

## 2021-10-14 DIAGNOSIS — M8000XS Age-related osteoporosis with current pathological fracture, unspecified site, sequela: Secondary | ICD-10-CM

## 2021-10-14 NOTE — Telephone Encounter (Signed)
CCM referral placed °

## 2021-10-14 NOTE — Telephone Encounter (Signed)
Please get her an appointment with [facility] for osteoporosis, new referral

## 2021-10-16 ENCOUNTER — Telehealth: Payer: Self-pay

## 2021-10-16 NOTE — Chronic Care Management (AMB) (Signed)
Chronic Care Management   Outreach Note  10/16/2021 Name: AALYA BADY MRN: [id number] DOB: 05-11-1946  KARISSA BRUESCH is a 76 y.o. year old female who is a primary care patient of Dettinger, Elige Radon, MD. I reached out to Kyla Balzarine by phone today in response to a referral sent by Ms. Lanice Shirts Mcraney's primary care provider.  An unsuccessful telephone outreach was attempted today. The patient was referred to the case management team for assistance with care management and care coordination.   Follow Up Plan: A HIPAA compliant phone message was left for the patient providing contact information and requesting a return call.  The care management team will reach out to the patient again over the next 5 days.  If patient returns call to provider office, please advise to call Embedded Care Management Care Guide Penne Lash * at [phone]*  Penne Lash, RMA Care Guide, Embedded Care Coordination [facility]  [place], [place] [address] Direct Dial: [phone] [email or web address]

## 2021-10-20 NOTE — Chronic Care Management (AMB) (Signed)
Chronic Care Management   Note  10/20/2021 Name: [name] MRN: [id number] DOB: 02/12/1946  [name] is a 76 y.o. year old female who is a primary care patient of Dettinger, Elige Radon, MD. I reached out to [name] by phone today in response to a referral sent by [name]'s PCP.  [name] was given information about Chronic Care Management services today including:  CCM service includes personalized support from designated clinical staff supervised by her physician, including individualized plan of care and coordination with other care providers 24/7 contact phone numbers for assistance for urgent and routine care needs. Service will only be billed when office clinical staff spend 20 minutes or more in a month to coordinate care. Only one practitioner may furnish and bill the service in a calendar month. The patient may stop CCM services at any time (effective at the end of the month) by phone call to the office staff. The patient is responsible for co-pay (up to 20% after annual deductible is met) if co-pay is required by the individual health plan.   Patient agreed to services and verbal consent obtained.   Follow up plan: Telephone appointment with care management team member scheduled for:11/27/2021  [name], RMA Care Guide, Embedded Care Coordination [facility]  [place], [place] [address] Direct Dial: [phone] [email or web address]

## 2021-10-21 ENCOUNTER — Ambulatory Visit (INDEPENDENT_AMBULATORY_CARE_PROVIDER_SITE_OTHER): Payer: Medicare Other | Admitting: Family Medicine

## 2021-10-21 ENCOUNTER — Ambulatory Visit: Payer: Medicare Other | Admitting: Family Medicine

## 2021-10-21 ENCOUNTER — Encounter: Payer: Self-pay | Admitting: Family Medicine

## 2021-10-21 VITALS — BP 122/70 | HR 76 | Ht 59.0 in | Wt 149.0 lb

## 2021-10-21 DIAGNOSIS — M8000XS Age-related osteoporosis with current pathological fracture, unspecified site, sequela: Secondary | ICD-10-CM

## 2021-10-21 DIAGNOSIS — M19012 Primary osteoarthritis, left shoulder: Secondary | ICD-10-CM | POA: Diagnosis not present

## 2021-10-21 DIAGNOSIS — M810 Age-related osteoporosis without current pathological fracture: Secondary | ICD-10-CM

## 2021-10-21 DIAGNOSIS — M19011 Primary osteoarthritis, right shoulder: Secondary | ICD-10-CM | POA: Diagnosis not present

## 2021-10-21 NOTE — Progress Notes (Signed)
BP 122/70    Pulse 76    Ht 4\' 11"  (1.499 m)    Wt 149 lb (67.6 kg)    SpO2 96%    BMI 30.09 kg/m    Subjective:   Patient ID: [name], female    DOB: [birth year], 76 y.o.   MRN: [id number]  HPI: [name] is a 76 y.o. female presenting on 10/21/2021 for Shoulder Pain (bilateral) and Nail Problem (Right first finger, black streak, present x45m)   HPI Osteoporosis/osteopenia Fractures or history of fracture: History of vertebral fracture Medication: Fosamax currently but wants to look into other options because she does get a lot of indigestion. Duration of treatment: 2 years Last bone density scan: -2.5 on 08/05/2021 Last T score:    Patient is coming in with complaints of bilateral shoulder pain that has been bothering her for couple weeks.  She says it is actually improved today and not bothering her as much. She is saying that she uses creams and rubs it and started some exercises and it shows improvement so she just wanted to mention it.  Patient has a small hemorrhage or bruise under her nail in a linear pattern but it seems to be growing out as her nail grows out, its been there for about a month.  We will watch it for now and she will let us know if it does not go away.  Relevant past medical, surgical, family and social history reviewed and updated as indicated. Interim medical history since our last visit reviewed. Allergies and medications reviewed and updated.  Review of Systems  Constitutional:  Negative for chills and fever.  Eyes:  Negative for visual disturbance.  Respiratory:  Negative for chest tightness and shortness of breath.   Cardiovascular:  Negative for chest pain and leg swelling.  Genitourinary:  Negative for difficulty urinating and dysuria.  Musculoskeletal:  Positive for arthralgias. Negative for back pain and gait problem.  Skin:  Positive for color change. Negative for rash and wound.  Neurological:  Negative for light-headedness and  headaches.  Psychiatric/Behavioral:  Negative for agitation and behavioral problems.   All other systems reviewed and are negative.  Per HPI unless specifically indicated above   Allergies as of 10/21/2021       Reactions   Poison Ivy Extract Rash   Benadryl [diphenhydramine Hcl]    Neomycin-polymyxin B Gu Other (See Comments)   unknown   Nsaids Hives   Teriparatide Hives        Medication List        Accurate as of October 21, 2021  2:30 PM. If you have any questions, ask your nurse or doctor.          acetaminophen 500 MG tablet Commonly known as: TYLENOL Take 650 mg by mouth as needed.   alendronate 70 MG tablet Commonly known as: FOSAMAX TAKE 1 TABLET BY MOUTH  WEEKLY WITH 8 OZ OF PLAIN  WATER 30 MINUTES BEFORE  FIRST FOOD, DRINK OR MEDS.  STAY UPRIGHT FOR 30 MINS   aluminum hydroxide-magnesium carbonate 95-358 MG/15ML Susp Commonly known as: GAVISCON Take by mouth.   Calcium 1000 + D 1000-20 MG-MCG Tabs Generic drug: Calcium Carb-Cholecalciferol Take 600 mg by mouth daily.   cholecalciferol 25 MCG (1000 UNIT) tablet Commonly known as: VITAMIN D3 Take 1,000 Units by mouth daily.   Coenzyme Q10 200 MG capsule Take 200 mg by mouth daily.   famotidine 10 MG tablet Commonly known as: PEPCID Take  1 tablet (10 mg total) by mouth daily.   Fish Oil 1000 MG Caps Take 1,200 mg by mouth in the morning and at bedtime.   hyoscyamine 0.375 MG 12 hr tablet Commonly known as: LEVBID Take 1 tablet (0.375 mg total) by mouth 2 (two) times daily.   hyoscyamine 0.125 MG SL tablet Commonly known as: LEVSIN SL DISSOLVE 1 TABLET IN MOUTH EVERY 8 HOURS AS NEEDED   Magnesium 500 MG Caps Take 500 mg by mouth.   multivitamin capsule Take 1 capsule by mouth daily.   NASACORT ALLERGY 24HR NA Place 1 spray into the nose daily.   OSTEO BI-FLEX JOINT SHIELD PO Take by mouth in the morning and at bedtime.   pravastatin 20 MG tablet Commonly known as: PRAVACHOL Take  1 tablet (20 mg total) by mouth at bedtime.   psyllium 58.6 % packet Commonly known as: METAMUCIL Take 1 packet by mouth 2 (two) times daily.   TURMERIC PO Take 2,000 mg by mouth in the morning and at bedtime.   Zinc 50 MG Caps Take 50 mg by mouth daily.         Objective:   BP 122/70    Pulse 76    Ht 4\' 11"  (1.499 m)    Wt 149 lb (67.6 kg)    SpO2 96%    BMI 30.09 kg/m   Wt Readings from Last 3 Encounters:  10/21/21 149 lb (67.6 kg)  07/22/21 146 lb (66.2 kg)  01/19/21 147 lb (66.7 kg)    Physical Exam Vitals and nursing note reviewed.  Constitutional:      General: She is not in acute distress.    Appearance: She is well-developed. She is not diaphoretic.  Eyes:     Conjunctiva/sclera: Conjunctivae normal.  Cardiovascular:     Rate and Rhythm: Normal rate and regular rhythm.     Heart sounds: Normal heart sounds. No murmur heard. Pulmonary:     Effort: Pulmonary effort is normal. No respiratory distress.     Breath sounds: Normal breath sounds. No wheezing.  Musculoskeletal:        General: No swelling or tenderness. Normal range of motion.     Right shoulder: Crepitus present. No swelling, tenderness or bony tenderness. Normal range of motion.     Left shoulder: Crepitus present. No swelling, tenderness or bony tenderness. Normal range of motion.  Skin:    General: Skin is warm and dry.     Findings: No rash.  Neurological:     Mental Status: She is alert and oriented to person, place, and time.     Coordination: Coordination normal.  Psychiatric:        Behavior: Behavior normal.      Assessment & Plan:   Problem List Items Addressed This Visit       Musculoskeletal and Integument   Osteoporosis - Primary   Relevant Orders   AMB Referral to [facility] Coordinaton   Other Visit Diagnoses     Primary osteoarthritis of both shoulders       Relevant Orders   AMB Referral to [place]       She was, Fosamax and wants to  consider other medicines, will send to clinical pharmacist for management to discuss possibilities. Follow up plan: Return in about 8 months (around 06/21/2022), or if symptoms worsen or fail to improve, for physical.  Counseling provided for all of the vaccine components Orders Placed This Encounter  Procedures   AMB Referral  to [place], MD [facility] 10/21/2021, 2:30 PM

## 2021-10-26 ENCOUNTER — Other Ambulatory Visit: Payer: Self-pay | Admitting: Family Medicine

## 2021-10-29 ENCOUNTER — Ambulatory Visit: Payer: Medicare Other | Admitting: Family Medicine

## 2021-11-10 ENCOUNTER — Encounter: Payer: Self-pay | Admitting: Gastroenterology

## 2021-11-10 ENCOUNTER — Ambulatory Visit: Payer: Medicare Other | Admitting: Gastroenterology

## 2021-11-10 VITALS — BP 130/78 | HR 64 | Ht 59.0 in | Wt 148.2 lb

## 2021-11-10 DIAGNOSIS — R1010 Upper abdominal pain, unspecified: Secondary | ICD-10-CM | POA: Diagnosis not present

## 2021-11-10 DIAGNOSIS — K219 Gastro-esophageal reflux disease without esophagitis: Secondary | ICD-10-CM

## 2021-11-10 MED ORDER — FAMOTIDINE 20 MG PO TABS: 20.0000 mg | ORAL_TABLET | Freq: Two times a day (BID) | ORAL | 11 refills | Status: DC

## 2021-11-10 NOTE — Patient Instructions (Signed)
Increase your famotidine 20 mg one tablet by mouth twice daily. A new prescription has been sent to your pharmacy.   Patient advised to avoid spicy, acidic, citrus, chocolate, mints, fruit and fruit juices.  Limit the intake of caffeine, alcohol and Soda.  Don't exercise too soon after eating.  Don't lie down within 3-4 hours of eating.  Elevate the head of your bed.  You have been scheduled for an abdominal ultrasound at [facility] (1st floor of hospital) on 11/16/21 at 9:30 am. Please arrive 15 minutes prior to your appointment for registration. Make certain not to have anything to eat or drink 6 hours prior to your appointment. Should you need to reschedule your appointment, please contact radiology at [phone]. This test typically takes about 30 minutes to perform.  Due to recent changes in healthcare laws, you may see the results of your imaging and laboratory studies on MyChart before your provider has had a chance to review them.  We understand that in some cases there may be results that are confusing or concerning to you. Not all laboratory results come back in the same time frame and the provider may be waiting for multiple results in order to interpret others.  Please give [place] 48 hours in order for your provider to thoroughly review all the results before contacting the office for clarification of your results.   The [facility] providers would like to encourage you to use [facility] to communicate with providers for non-urgent requests or questions.  Due to long hold times on the telephone, sending your provider a message by [facility] may be a faster and more efficient way to get a response.  Please allow 48 business hours for a response.  Please remember that this is for non-urgent requests.   Thank you for choosing me and [facility].  Pricilla Riffle. Dagoberto Ligas., MD., [facility]

## 2021-11-10 NOTE — Progress Notes (Signed)
° ° °  History of Present Illness: This is a 76 year old female with upper abdominal pain, substernal burning and infrequent solid food dysphagia.  She has a history of GERD GERD and an esophageal stricture.  She relates she discontinued Fosamax several months ago and her symptoms improved.  She takes famotidine 10 to 20 mg daily as needed.  She relates occasional episodes of more severe right upper quadrant pain that radiates around her right flank.  The symptoms are relieved with Levbid and hyoscyamine.  She relates that she had a HIDA scan performed years ago that showed a low gallbladder ejection fraction.  I am unable to locate those records in Epic.  EGD 03/2018 - Benign-appearing esophageal stenosis. Dilated. - Medium-sized hiatal hernia. - Gastric diverticulum. - Cameron erosions. - Normal duodenal bulb and second portion of the duodenum. - No specimens collected.  Current Medications, Allergies, Past Medical History, Past Surgical History, Family History and Social History were reviewed in [employer] record.   Physical Exam: General: Well developed, well nourished, no acute distress Head: Normocephalic and atraumatic Eyes: Sclerae anicteric, EOMI Ears: Normal auditory acuity Mouth: Not examined, mask on during Covid-19 pandemic Lungs: Clear throughout to auscultation Heart: Regular rate and rhythm; no murmurs, rubs or bruits Abdomen: Soft, mild tenderness across her upper abdomen and non distended. No masses, hepatosplenomegaly or hernias noted. Normal Bowel sounds Rectal:  Not done Musculoskeletal: Symmetrical with no gross deformities  Pulses:  Normal pulses noted Extremities: No clubbing, cyanosis, edema or deformities noted Neurological: Alert oriented x 4, grossly nonfocal Psychological:  Alert and cooperative. Normal mood and affect   Assessment and Recommendations:  Frequent mild upper abdominal pain, intermittent more severe RUQ pain, GERD,  infrequent solid food dysphagia. Schedule Abd [place]. Increase famotidine to 20 mg po bid. Levsin and/or Levid if upper abdominal pain persists and is not relieved with famotidine.  If symptoms are persistent recommend blood work and EGD for further evaluation. REV in 6 weeks.

## 2021-11-16 ENCOUNTER — Ambulatory Visit (HOSPITAL_COMMUNITY)
Admission: RE | Admit: 2021-11-16 | Discharge: 2021-11-16 | Disposition: A | Discharge status: Home / Self Care | Payer: Self-pay | Source: Ambulatory Visit | Attending: Gastroenterology | Admitting: Gastroenterology

## 2021-11-16 ENCOUNTER — Other Ambulatory Visit: Payer: Self-pay

## 2021-11-16 DIAGNOSIS — K8020 Calculus of gallbladder without cholecystitis without obstruction: Secondary | ICD-10-CM | POA: Diagnosis not present

## 2021-11-16 DIAGNOSIS — R1010 Upper abdominal pain, unspecified: Secondary | ICD-10-CM | POA: Insufficient documentation

## 2021-11-16 DIAGNOSIS — K219 Gastro-esophageal reflux disease without esophagitis: Secondary | ICD-10-CM | POA: Insufficient documentation

## 2021-11-16 DIAGNOSIS — K760 Fatty (change of) liver, not elsewhere classified: Secondary | ICD-10-CM | POA: Diagnosis not present

## 2021-11-20 ENCOUNTER — Encounter: Payer: Self-pay | Admitting: Family Medicine

## 2021-11-20 ENCOUNTER — Telehealth: Payer: Self-pay

## 2021-11-20 ENCOUNTER — Encounter: Payer: Self-pay | Admitting: Gastroenterology

## 2021-11-20 NOTE — Telephone Encounter (Signed)
Spoke with pt regarding her questions about the [place]. She is aware that [facility] referral has been made, and to call us back if she has not heard from them in a week.

## 2021-11-23 ENCOUNTER — Ambulatory Visit: Payer: Self-pay | Admitting: Family Medicine

## 2021-11-25 ENCOUNTER — Telehealth: Payer: Self-pay | Admitting: Gastroenterology

## 2021-11-25 NOTE — Telephone Encounter (Signed)
Patient called back today stating she still has not been contacted about scheduling an appointment with the surgeon.  Please call patient and advise.  Thank you.

## 2021-11-25 NOTE — Telephone Encounter (Signed)
Phone number for [facility] given to patient, and instructed to call if she has not heard from them by next week. It has not been a week yet since referral was placed.

## 2021-11-27 ENCOUNTER — Ambulatory Visit (INDEPENDENT_AMBULATORY_CARE_PROVIDER_SITE_OTHER): Payer: PPO | Admitting: Pharmacist

## 2021-11-27 DIAGNOSIS — M810 Age-related osteoporosis without current pathological fracture: Secondary | ICD-10-CM

## 2021-11-27 DIAGNOSIS — M8000XS Age-related osteoporosis with current pathological fracture, unspecified site, sequela: Secondary | ICD-10-CM

## 2021-11-27 NOTE — Progress Notes (Signed)
° ° °  11/27/2021 Name: FLORENDA WATT MRN: [id number] DOB: 12/18/1945  S:  20 yoF Presents for osteoporosis evaluation, education, and management  Current Height:  4'11"      Max Lifetime Height:  4'11" New DEXA report-reports loss of height--we do not have baseline height Current Weight:   149lb      Ethnicity:Caucasian  BP:   122/70      HPI: Does pt already have a diagnosis of:  Osteopenia?  Yes Osteoporosis?  Yes  Back Pain?  No        Prior fracture?  No Med(s) for Osteoporosis/Osteopenia:  tried fosamax (can't tolerate); vitamin D, unable to take calcium, but gets in diet                                                             PMH:  HRT? No Steroid Use?  No Thyroid med?  No History of cancer?  No History of digestive disorders (ie Crohn's)?  Yes; GERD, hiatal hernia Current or previous eating disorders?  No Last Vitamin D Result:  78 (07/14/21) Last GFR Result:  67 (07/14/21)   FH/SH: Family history of osteoporosis?  No Parent with history of hip fracture?  No Family history of breast cancer?  No Exercise?  No Smoking?  No Alcohol?  No    Calcium Assessment Calcium Intake  # of servings/day  Calcium mg  Milk (8 oz) 1  x  300  = 300  Yogurt (4 oz) 0 x  200 = 0  Cheese (1 oz) 1 x  200 = 200  Other Calcium sources   250mg   Ca supplement 0 = 0   Estimated calcium intake per day 750    DEXA Results 2022 Reports loss of height; low bone mass   Date of Test T-Score for AP Spine L1-L4 T-Score for Total Left Hip T-Score for Total Right Hip  07/24/2021 -1.4 -2.5 -2.1  07/25/2019 -2.4               FRAX 10 year estimate: Not reported due to: Femoral neck at or below -2.5 Prior hip or vertebral fracture Treated for osteoporosis  Assessment: -patient has had some improvement in the spine area -she has had some GI issues and will need upcoming surgery (will hold on PO bisphosphonates) -discussed other injections/infusions for osteoporosis--patient would  like to hold off on additional therapy at this time; we can readdress this summer after surgery  Recommendations: 1.  Will hold off on osteoporosis treatment; continue calcium/vitamin D 2.  continue calcium 1200mg  daily through supplementation or diet.  3.  continue weight bearing exercise - 30 minutes at least 4 days per week.  As able 4.  Counseled and educated about fall risk and prevention.  Recheck DEXA:  2 years  Time spent counseling patient:  25 minutes   Regina Eck, PharmD, BCPS Clinical Pharmacist, [place]  [facility] Phone [phone]

## 2021-12-08 ENCOUNTER — Other Ambulatory Visit: Payer: Self-pay | Admitting: *Deleted

## 2021-12-08 MED ORDER — PRAVASTATIN SODIUM 20 MG PO TABS: 20.0000 mg | ORAL_TABLET | Freq: Every day | ORAL | 1 refills | Status: DC

## 2021-12-10 NOTE — Telephone Encounter (Signed)
Returned Angie's call from [place] let her know that we do not use proficient for referrals.  ?

## 2021-12-10 NOTE — Telephone Encounter (Signed)
Angie from [facility] called and requested we put this patient's referral through [employer]. ? ?Thank you. ?

## 2021-12-22 ENCOUNTER — Encounter: Payer: Self-pay | Admitting: Gastroenterology

## 2021-12-22 ENCOUNTER — Other Ambulatory Visit (INDEPENDENT_AMBULATORY_CARE_PROVIDER_SITE_OTHER): Payer: PPO

## 2021-12-22 ENCOUNTER — Ambulatory Visit: Payer: PPO | Admitting: Gastroenterology

## 2021-12-22 VITALS — BP 148/98 | HR 87 | Ht 59.0 in | Wt 148.5 lb

## 2021-12-22 DIAGNOSIS — R1010 Upper abdominal pain, unspecified: Secondary | ICD-10-CM

## 2021-12-22 DIAGNOSIS — K760 Fatty (change of) liver, not elsewhere classified: Secondary | ICD-10-CM

## 2021-12-22 DIAGNOSIS — K219 Gastro-esophageal reflux disease without esophagitis: Secondary | ICD-10-CM | POA: Diagnosis not present

## 2021-12-22 DIAGNOSIS — R1011 Right upper quadrant pain: Secondary | ICD-10-CM | POA: Diagnosis not present

## 2021-12-22 LAB — LIPASE: Lipase: 50 U/L (ref 11.0–59.0)

## 2021-12-22 LAB — COMPREHENSIVE METABOLIC PANEL
ALT: 13 U/L (ref 0–35)
AST: 17 U/L (ref 0–37)
Albumin: 4.5 g/dL (ref 3.5–5.2)
Alkaline Phosphatase: 49 U/L (ref 39–117)
BUN: 16 mg/dL (ref 6–23)
CO2: 28 mEq/L (ref 19–32)
Calcium: 10.1 mg/dL (ref 8.4–10.5)
Chloride: 105 mEq/L (ref 96–112)
Creatinine, Ser: 0.88 mg/dL (ref 0.40–1.20)
GFR: 64.63 mL/min (ref >60.00)
Glucose, Bld: 105 mg/dL — ABNORMAL HIGH (ref 70–99)
Potassium: 4.7 mEq/L (ref 3.5–5.1)
Sodium: 140 mEq/L (ref 135–145)
Total Bilirubin: 0.3 mg/dL (ref 0.2–1.2)
Total Protein: 6.9 g/dL (ref 6.0–8.3)

## 2021-12-22 LAB — CBC WITH DIFFERENTIAL/PLATELET
Basophils Absolute: 0 10*3/uL (ref 0.0–0.1)
Basophils Relative: 0.5 % (ref 0.0–3.0)
Eosinophils Absolute: 0.1 10*3/uL (ref 0.0–0.7)
Eosinophils Relative: 1.9 % (ref 0.0–5.0)
HCT: 43.6 % (ref 36.0–46.0)
Hemoglobin: 14.5 g/dL (ref 12.0–15.0)
Lymphocytes Relative: 32.8 % (ref 12.0–46.0)
Lymphs Abs: 2.1 10*3/uL (ref 0.7–4.0)
MCHC: 33.2 g/dL (ref 30.0–36.0)
MCV: 84.2 fl (ref 78.0–100.0)
Monocytes Absolute: 0.6 10*3/uL (ref 0.1–1.0)
Monocytes Relative: 9 % (ref 3.0–12.0)
Neutro Abs: 3.6 10*3/uL (ref 1.4–7.7)
Neutrophils Relative %: 55.8 % (ref 43.0–77.0)
Platelets: 208 10*3/uL (ref 150.0–400.0)
RBC: 5.18 Mil/uL — ABNORMAL HIGH (ref 3.87–5.11)
RDW: 14.1 % (ref 11.5–15.5)
WBC: 6.4 10*3/uL (ref 4.0–10.5)

## 2021-12-22 NOTE — Patient Instructions (Addendum)
Your provider has requested that you go to the basement level for lab work before leaving today. Press "B" on the elevator. The lab is located at the first door on the left as you exit the elevator. ? ?Keep your appointment at [facility] next week. ? ?Due to recent changes in healthcare laws, you may see the results of your imaging and laboratory studies on MyChart before your provider has had a chance to review them.  We understand that in some cases there may be results that are confusing or concerning to you. Not all laboratory results come back in the same time frame and the provider may be waiting for multiple results in order to interpret others.  Please give [place] 48 hours in order for your provider to thoroughly review all the results before contacting the office for clarification of your results.  ? ?The [facility] providers would like to encourage you to use [facility] to communicate with providers for non-urgent requests or questions.  Due to long hold times on the telephone, sending your provider a message by [facility] may be a faster and more efficient way to get a response.  Please allow 48 business hours for a response.  Please remember that this is for non-urgent requests.  ? ?Thank you for choosing me and [facility]. ? ?Malcolm T. Dagoberto Ligas., MD., [facility] ? ?

## 2021-12-22 NOTE — Progress Notes (Signed)
? ? ?  History of Present Illness: This is a 76 year old female returning for follow-up of RUQ abdominal pain, GERD, infrequent solid food dysphagia.  Please refer to November 10, 2021 office visit. Abd [place] November 16, 2021 showed cholelithiasis and hepatic steatosis.  She relates almost daily right upper quadrant pain that radiates to her right scapula.  She has much less frequent intermittent difficulties with milder left-sided abdominal pain that appears to respond to antispasmodics.  Her reflux symptoms are under good control on her current regimen.  She has infrequent mild solid food dysphagia. ? ?Current Medications, Allergies, Past Medical History, Past Surgical History, Family History and Social History were reviewed in [employer] record. ? ? ?Physical Exam: ?General: Well developed, well nourished, no acute distress ?Head: Normocephalic and atraumatic ?Eyes: Sclerae anicteric, EOMI ?Ears: Normal auditory acuity ?Mouth: Not examined, mask on during Covid-19 pandemic ?Lungs: Clear throughout to auscultation ?Heart: Regular rate and rhythm; no murmurs, rubs or bruits ?Abdomen: Soft, non tender and non distended. No masses, hepatosplenomegaly or hernias noted. Normal Bowel sounds ?Rectal: Not done ?Musculoskeletal: Symmetrical with no gross deformities  ?Pulses:  Normal pulses noted ?Extremities: No clubbing, cyanosis, edema or deformities noted ?Neurological: Alert oriented x 4, grossly nonfocal ?Psychological:  Alert and cooperative. Normal mood and affect ? ? ?Assessment and Recommendations: ? ?RUQ abdominal pain felt secondary to cholelithiasis. Appt with [facility] on March 21 for consideration of cholecystectomy. CMP, CBC, lipase today.  ?GERD, medium sized hiatal hernia, Cameron erosions, history of esophageal stricture and infrequent solid food dysphagia.  Continue famotidine 20 mg po bid and antireflux measures.  If her solid food dysphagia progresses in frequency or severity will  schedule EGD with dilation. REV in 2-3 months.  ?Intermittent left sided abdominal pain responding to Levbid and Levsin. REV in 2-3 months.  ?Hepatic steatosis. Carb modified, fat modified, weight loss diet long term. Limit alcohol intake.  ?

## 2021-12-24 ENCOUNTER — Other Ambulatory Visit: Payer: Self-pay | Admitting: Family Medicine

## 2021-12-25 NOTE — Telephone Encounter (Signed)
Called and spoke with patient she does both.  ?

## 2021-12-25 NOTE — Telephone Encounter (Signed)
Please call her and ask her to verify dosing ?

## 2021-12-29 ENCOUNTER — Other Ambulatory Visit: Payer: Self-pay | Admitting: Surgery

## 2021-12-29 DIAGNOSIS — K8020 Calculus of gallbladder without cholecystitis without obstruction: Secondary | ICD-10-CM | POA: Diagnosis not present

## 2022-01-12 ENCOUNTER — Other Ambulatory Visit: Payer: Self-pay

## 2022-01-12 ENCOUNTER — Encounter (HOSPITAL_BASED_OUTPATIENT_CLINIC_OR_DEPARTMENT_OTHER): Payer: Self-pay | Admitting: Surgery

## 2022-01-20 NOTE — Progress Notes (Signed)

## 2022-01-23 NOTE — H&P (Signed)
?REFERRING PHYSICIAN: Estanislado Emms., MD ? ?PROVIDER: Beverlee Nims, MD ? ?MRN: [id number] ?DOB: 1946/01/04 ? ?Subjective  ? ?Chief Complaint: New Patient (New patient right upper quadrant pain ) ? ? ?History of Present Illness: ?[name] is a 76 y.o. female who is seen today as an office consultation for evaluation of New Patient (New patient right upper quadrant pain ) ?.  ? ?This is a very pleasant 76 year old female referred here for possible symptomatic gallstones. She has been having intermittent epigastric abdominal pain for many years. She has nausea but no emesis. Previously she has had esophageal stricturing. She has had ultrasounds in the past that showed no gallstones until her most recent ultrasound. She reports having had a HIDA scan many years ago which showed a decreased ejection fraction but do not have these records. She denies jaundice. She reports the the pain is moderate and described as sharp in the right upper quadrant rating through to the back. ?The most recent ultrasound showed notable gallstones with the largest being 1.1 cm. There is no gallbladder wall thickening and the bile duct was normal. She has no cardiopulmonary issues. ? ?Review of Systems: ?A complete review of systems was obtained from the patient. I have reviewed this information and discussed as appropriate with the patient. See HPI as well for other ROS. ? ?ROS  ? ?Medical History: ?Past Medical History:  ?Diagnosis Date  ? Arthritis  ? Diabetes mellitus without complication (CMS-HCC)  ? GERD (gastroesophageal reflux disease)  ? Hyperlipidemia  ? Hypertension  ? ?There is no problem list on file for this patient. ? ?Past Surgical History:  ?Procedure Laterality Date  ? left total knee replacement  ? ? ?Allergies  ?Allergen Reactions  ? Nsaids (Non-Steroidal Anti-Inflammatory Drug) Hives and Rash  ?Whelps ? ? ?Current Outpatient Medications on File Prior to Visit  ?Medication Sig Dispense Refill  ? famotidine  (PEPCID) 20 MG tablet Take 20 mg by mouth 2 (two) times daily  ? hyoscyamine (LEVSIN/SL) 0.125 mg SL tablet hyoscyamine 0.125 mg sublingual tablet ?DISSOLVE 1 TABLET IN MOUTH EVERY 8 HOURS AS NEEDED  ? pravastatin (PRAVACHOL) 20 MG tablet pravastatin 20 mg tablet  ? co-enzyme Q-10, ubiquinone, 200 mg capsule Take by mouth  ? multivitamin capsule Take 1 capsule by mouth once daily  ? ?No current facility-administered medications on file prior to visit.  ? ?Family History  ?Problem Relation Age of Onset  ? Hyperlipidemia (Elevated cholesterol) Mother  ? High blood pressure (Hypertension) Mother  ? Obesity Mother  ? Diabetes Mother  ? Deep vein thrombosis (DVT or abnormal blood clot formation) Mother  ? Obesity Sister  ? High blood pressure (Hypertension) Sister  ? Hyperlipidemia (Elevated cholesterol) Sister  ? Coronary Artery Disease (Blocked arteries around heart) Sister  ? Diabetes Sister  ? High blood pressure (Hypertension) Brother  ? Obesity Brother  ? Hyperlipidemia (Elevated cholesterol) Brother  ? Coronary Artery Disease (Blocked arteries around heart) Brother  ? Diabetes Brother  ? ? ?Social History  ? ?Tobacco Use  ?Smoking Status Never  ?Smokeless Tobacco Never  ? ? ?Social History  ? ?Socioeconomic History  ? Marital status: Married  ?Tobacco Use  ? Smoking status: Never  ? Smokeless tobacco: Never  ?Substance and Sexual Activity  ? Alcohol use: Never  ? Drug use: Never  ? ?Objective:  ? ?Vitals:  ? ?BP: 132/80  ?Pulse: 74  ?Temp: 36.7 ?C (98 ?F)  ?SpO2: 98%  ?Weight: 67.9 kg (149 lb  12.8 oz)  ?Height: 149.9 cm ('4\' 11"'$ )  ? ?Body mass index is 30.26 kg/m?. ? ?Physical Exam  ? ?She appears well on exam ? ?There is some very slight tenderness to deep palpation of her abdomen in the right upper quadrant. There is no hepatomegaly. There are no hernias ? ?Labs, Imaging and Diagnostic Testing: ?I have reviewed her ultrasound as well as her notes from her gastroenterologist ? ?Assessment and Plan:  ? ?Diagnoses  and all orders for this visit: ? ?Symptomatic cholelithiasis ? ? ? ?This point we had a long discussion regarding symptomatic gallstones. We next discussed proceeding with a laparoscopic cholecystectomy. I gave her literature regarding this. I explained the surgical procedure in detail. We discussed the risks which includes but is not limited to bleeding, infection, injury to surrounding structures, the need to convert to an open procedure, bile duct injury, bile leak, cardiopulmonary issues, postoperative recovery, etc. She understands and wishes to proceed with surgery which will be scheduled  ?

## 2022-01-25 ENCOUNTER — Ambulatory Visit (HOSPITAL_BASED_OUTPATIENT_CLINIC_OR_DEPARTMENT_OTHER): Payer: PPO | Admitting: Anesthesiology

## 2022-01-25 ENCOUNTER — Other Ambulatory Visit: Payer: Self-pay

## 2022-01-25 ENCOUNTER — Encounter (HOSPITAL_BASED_OUTPATIENT_CLINIC_OR_DEPARTMENT_OTHER): Payer: Self-pay | Admitting: Surgery

## 2022-01-25 ENCOUNTER — Ambulatory Visit (HOSPITAL_BASED_OUTPATIENT_CLINIC_OR_DEPARTMENT_OTHER)
Admission: RE | Admit: 2022-01-25 | Discharge: 2022-01-25 | Disposition: A | Discharge status: Home / Self Care | Payer: PPO | Attending: Surgery | Admitting: Surgery

## 2022-01-25 ENCOUNTER — Encounter (HOSPITAL_BASED_OUTPATIENT_CLINIC_OR_DEPARTMENT_OTHER)
Admission: RE | Disposition: A | Discharge status: Home / Self Care | Payer: Self-pay | Source: Home / Self Care | Attending: Surgery

## 2022-01-25 DIAGNOSIS — K8010 Calculus of gallbladder with chronic cholecystitis without obstruction: Secondary | ICD-10-CM | POA: Insufficient documentation

## 2022-01-25 DIAGNOSIS — K449 Diaphragmatic hernia without obstruction or gangrene: Secondary | ICD-10-CM | POA: Insufficient documentation

## 2022-01-25 DIAGNOSIS — K219 Gastro-esophageal reflux disease without esophagitis: Secondary | ICD-10-CM | POA: Insufficient documentation

## 2022-01-25 DIAGNOSIS — K8020 Calculus of gallbladder without cholecystitis without obstruction: Secondary | ICD-10-CM | POA: Diagnosis not present

## 2022-01-25 DIAGNOSIS — K828 Other specified diseases of gallbladder: Secondary | ICD-10-CM | POA: Diagnosis not present

## 2022-01-25 HISTORY — PX: CHOLECYSTECTOMY: SHX55

## 2022-01-25 SURGERY — LAPAROSCOPIC CHOLECYSTECTOMY
Anesthesia: General | Site: Abdomen

## 2022-01-25 MED ORDER — CEFAZOLIN SODIUM-DEXTROSE 2-4 GM/100ML-% IV SOLN
INTRAVENOUS | Status: AC
  Filled 2022-01-25: qty 100

## 2022-01-25 MED ORDER — FENTANYL CITRATE (PF) 100 MCG/2ML IJ SOLN
25.0000 ug | INTRAMUSCULAR | Status: DC | PRN
  Administered 2022-01-25 (×3): 25 ug via INTRAVENOUS

## 2022-01-25 MED ORDER — DEXAMETHASONE SODIUM PHOSPHATE 10 MG/ML IJ SOLN
INTRAMUSCULAR | Status: AC
  Filled 2022-01-25: qty 1

## 2022-01-25 MED ORDER — FENTANYL CITRATE (PF) 100 MCG/2ML IJ SOLN
INTRAMUSCULAR | Status: DC | PRN
  Administered 2022-01-25 (×2): 50 ug via INTRAVENOUS

## 2022-01-25 MED ORDER — TRAMADOL HCL 50 MG PO TABS: 50.0000 mg | ORAL_TABLET | Freq: Four times a day (QID) | ORAL | 0 refills | Status: DC | PRN

## 2022-01-25 MED ORDER — CEFAZOLIN SODIUM-DEXTROSE 2-4 GM/100ML-% IV SOLN
2.0000 g | INTRAVENOUS | Status: AC
Start: 2022-01-26 — End: 2022-01-25
  Administered 2022-01-25: 2 g via INTRAVENOUS

## 2022-01-25 MED ORDER — LIDOCAINE HCL (CARDIAC) PF 100 MG/5ML IV SOSY
PREFILLED_SYRINGE | INTRAVENOUS | Status: DC | PRN
Start: 2022-01-25 — End: 2022-01-25
  Administered 2022-01-25: 60 mg via INTRAVENOUS

## 2022-01-25 MED ORDER — ACETAMINOPHEN 500 MG PO TABS
1000.0000 mg | ORAL_TABLET | ORAL | Status: AC
  Administered 2022-01-25: 1000 mg via ORAL

## 2022-01-25 MED ORDER — ENSURE PRE-SURGERY PO LIQD
296.0000 mL | Freq: Once | ORAL | Status: DC
Start: 2022-01-26 — End: 2022-01-25

## 2022-01-25 MED ORDER — ONDANSETRON HCL 4 MG/2ML IJ SOLN
INTRAMUSCULAR | Status: AC
  Filled 2022-01-25: qty 2

## 2022-01-25 MED ORDER — PROPOFOL 500 MG/50ML IV EMUL
INTRAVENOUS | Status: AC
  Filled 2022-01-25: qty 50

## 2022-01-25 MED ORDER — FENTANYL CITRATE (PF) 100 MCG/2ML IJ SOLN
INTRAMUSCULAR | Status: AC
  Filled 2022-01-25: qty 2

## 2022-01-25 MED ORDER — ONDANSETRON HCL 4 MG/2ML IJ SOLN
INTRAMUSCULAR | Status: DC | PRN
  Administered 2022-01-25: 4 mg via INTRAVENOUS

## 2022-01-25 MED ORDER — ONDANSETRON HCL 4 MG/2ML IJ SOLN: 4.0000 mg | Freq: Once | INTRAMUSCULAR | Status: DC | PRN

## 2022-01-25 MED ORDER — BUPIVACAINE-EPINEPHRINE (PF) 0.5% -1:200000 IJ SOLN
INTRAMUSCULAR | Status: DC | PRN
  Administered 2022-01-25: 20 mL

## 2022-01-25 MED ORDER — ROCURONIUM BROMIDE 100 MG/10ML IV SOLN
INTRAVENOUS | Status: DC | PRN
  Administered 2022-01-25: 70 mg via INTRAVENOUS

## 2022-01-25 MED ORDER — SUGAMMADEX SODIUM 200 MG/2ML IV SOLN
INTRAVENOUS | Status: DC | PRN
  Administered 2022-01-25: 250 mg via INTRAVENOUS

## 2022-01-25 MED ORDER — LACTATED RINGERS IV SOLN: INTRAVENOUS | Status: DC

## 2022-01-25 MED ORDER — ACETAMINOPHEN 500 MG PO TABS
ORAL_TABLET | ORAL | Status: AC
  Filled 2022-01-25: qty 2

## 2022-01-25 MED ORDER — LIDOCAINE 2% (20 MG/ML) 5 ML SYRINGE
INTRAMUSCULAR | Status: AC
  Filled 2022-01-25: qty 5

## 2022-01-25 MED ORDER — CHLORHEXIDINE GLUCONATE CLOTH 2 % EX PADS: 6.0000 | MEDICATED_PAD | Freq: Once | CUTANEOUS | Status: DC

## 2022-01-25 MED ORDER — EPHEDRINE SULFATE (PRESSORS) 50 MG/ML IJ SOLN
INTRAMUSCULAR | Status: DC | PRN
  Administered 2022-01-25: 10 mg via INTRAVENOUS

## 2022-01-25 MED ORDER — AMISULPRIDE (ANTIEMETIC) 5 MG/2ML IV SOLN: 10.0000 mg | Freq: Once | INTRAVENOUS | Status: DC | PRN

## 2022-01-25 MED ORDER — DEXAMETHASONE SODIUM PHOSPHATE 4 MG/ML IJ SOLN
INTRAMUSCULAR | Status: DC | PRN
  Administered 2022-01-25: 5 mg via INTRAVENOUS

## 2022-01-25 MED ORDER — CHLORHEXIDINE GLUCONATE CLOTH 2 % EX PADS
6.0000 | MEDICATED_PAD | Freq: Once | CUTANEOUS | Status: AC
Start: 2022-01-25 — End: 2022-01-25
  Administered 2022-01-25: 6 via TOPICAL

## 2022-01-25 MED ORDER — SODIUM CHLORIDE 0.9 % IR SOLN
Status: DC | PRN
  Administered 2022-01-25: 500 mL

## 2022-01-25 SURGICAL SUPPLY — 36 items
APPLIER CLIP 5 13 M/L LIGAMAX5 (MISCELLANEOUS) ×2
BAG RETRIEVAL 10 (BASKET) ×1
CHLORAPREP W/TINT 26 (MISCELLANEOUS) ×2 IMPLANT
CLIP APPLIE 5 13 M/L LIGAMAX5 (MISCELLANEOUS) ×1 IMPLANT
DERMABOND ADVANCED (GAUZE/BANDAGES/DRESSINGS) ×1
DERMABOND ADVANCED .7 DNX12 (GAUZE/BANDAGES/DRESSINGS) ×1 IMPLANT
ELECT REM PT RETURN 9FT ADLT (ELECTROSURGICAL) ×2
ELECTRODE REM PT RTRN 9FT ADLT (ELECTROSURGICAL) ×1 IMPLANT
GAUZE 4X4 16PLY ~~LOC~~+RFID DBL (SPONGE) ×2 IMPLANT
GLOVE BIO SURGEON STRL SZ 6.5 (GLOVE) ×1 IMPLANT
GLOVE BIO SURGEON STRL SZ7 (GLOVE) ×1 IMPLANT
GLOVE BIOGEL PI IND STRL 7.0 (GLOVE) IMPLANT
GLOVE BIOGEL PI INDICATOR 7.0 (GLOVE) ×1
GLOVE SURG SIGNA 7.5 PF LTX (GLOVE) ×2 IMPLANT
GOWN STRL REUS W/ TWL LRG LVL3 (GOWN DISPOSABLE) ×2 IMPLANT
GOWN STRL REUS W/ TWL XL LVL3 (GOWN DISPOSABLE) ×1 IMPLANT
GOWN STRL REUS W/TWL LRG LVL3 (GOWN DISPOSABLE) ×4
GOWN STRL REUS W/TWL XL LVL3 (GOWN DISPOSABLE) ×2
IRRIG SUCT STRYKERFLOW 2 WTIP (MISCELLANEOUS) ×2
IRRIGATION SUCT STRKRFLW 2 WTP (MISCELLANEOUS) ×1 IMPLANT
NS IRRIG 1000ML POUR BTL (IV SOLUTION) ×2 IMPLANT
PACK BASIN DAY SURGERY FS (CUSTOM PROCEDURE TRAY) ×2 IMPLANT
SCISSORS LAP 5X35 DISP (ENDOMECHANICALS) ×2 IMPLANT
SET TUBE SMOKE EVAC HIGH FLOW (TUBING) ×2 IMPLANT
SLEEVE ENDOPATH XCEL 5M (ENDOMECHANICALS) ×4 IMPLANT
SLEEVE SCD COMPRESS KNEE MED (STOCKING) ×2 IMPLANT
SPECIMEN JAR SMALL (MISCELLANEOUS) ×2 IMPLANT
SPIKE FLUID TRANSFER (MISCELLANEOUS) ×2 IMPLANT
SUT MON AB 4-0 PC3 18 (SUTURE) ×2 IMPLANT
SYS BAG RETRIEVAL 10MM (BASKET) ×1
SYSTEM BAG RETRIEVAL 10MM (BASKET) ×1 IMPLANT
TOWEL GREEN STERILE FF (TOWEL DISPOSABLE) ×2 IMPLANT
TRAY LAPAROSCOPIC (CUSTOM PROCEDURE TRAY) ×2 IMPLANT
TROCAR XCEL BLUNT TIP 100MML (ENDOMECHANICALS) ×2 IMPLANT
TROCAR XCEL NON-BLD 5MMX100MML (ENDOMECHANICALS) ×2 IMPLANT
TUBE CONNECTING 20X1/4 (TUBING) ×2 IMPLANT

## 2022-01-25 NOTE — Discharge Instructions (Addendum)
[facility] ______[facility] ?LAPAROSCOPIC SURGERY: POST OP INSTRUCTIONS ?Always review your discharge instruction sheet given to you by the facility where your surgery was performed. ?IF YOU HAVE DISABILITY OR FAMILY LEAVE FORMS, YOU MUST BRING THEM TO THE OFFICE FOR PROCESSING.   ?DO NOT GIVE THEM TO YOUR DOCTOR. ? ?A prescription for pain medication may be given to you upon discharge.  Take your pain medication as prescribed, if needed.  If narcotic pain medicine is not needed, then you may take acetaminophen (Tylenol) or ibuprofen (Advil) as needed. ?Take your usually prescribed medications unless otherwise directed. ?If you need a refill on your pain medication, please contact your pharmacy.  They will contact our office to request authorization. Prescriptions will not be filled after 5pm or on week-ends. ?You should follow a light diet the first few days after arrival home, such as soup and crackers, etc.  Be sure to include lots of fluids daily. ?Most patients will experience some swelling and bruising in the area of the incisions.  Ice packs will help.  Swelling and bruising can take several days to resolve.  ?It is common to experience some constipation if taking pain medication after surgery.  Increasing fluid intake and taking a stool softener (such as Colace) will usually help or prevent this problem from occurring.  A mild laxative (Milk of Magnesia or Miralax) should be taken according to package instructions if there are no bowel movements after 48 hours. ?Unless discharge instructions indicate otherwise, you may remove your bandages 24-48 hours after surgery, and you may shower at that time.  You may have steri-strips (small skin tapes) in place directly over the incision.  These strips should be left on the skin for 7-10 days.  If your surgeon used skin glue on the incision, you may shower in 24 hours.  The glue will flake off over the next 2-3 weeks.  Any sutures or staples will be  removed at the office during your follow-up visit. ?ACTIVITIES:  You may resume regular (light) daily activities beginning the next day--such as daily self-care, walking, climbing stairs--gradually increasing activities as tolerated.  You may have sexual intercourse when it is comfortable.  Refrain from any heavy lifting or straining until approved by your doctor. ?You may drive when you are no longer taking prescription pain medication, you can comfortably wear a seatbelt, and you can safely maneuver your car and apply brakes. ?RETURN TO WORK:  __________________________________________________________ ?You should see your doctor in the office for a follow-up appointment approximately 2-3 weeks after your surgery.  Make sure that you call for this appointment within a day or two after you arrive home to insure a convenient appointment time. ?OTHER INSTRUCTIONS: ok to shower starting tomorrow ?Ice pack, tylenol, and ibuprofen also for pain ?No lifting more than 15 to 20 pounds for 4 week ?__________________________________________________________________________________________________________________________ __________________________________________________________________________________________________________________________ ?WHEN TO CALL YOUR DOCTOR: ?Fever over 101.0 ?Inability to urinate ?Continued bleeding from incision. ?Increased pain, redness, or drainage from the incision. ?Increasing abdominal pain ? ?The clinic staff is available to answer your questions during regular business hours.  Please don?t hesitate to call and ask to speak to one of the nurses for clinical concerns.  If you have a medical emergency, go to the nearest emergency room or call 911.  A surgeon from [facility] is always on call at the hospital. ?[address], [place], [place], [place]  [address] ? [address], [place], [place]   [address] ?[phone] ?  [phone] ? FAX [phone] ?Web site:  [email or web address] ? ?May take Tylenol after 7:15pm, if needed.  ? ?Post Anesthesia Home Care Instructions ? ?Activity: ?Get plenty of rest for the remainder of the day. A responsible individual must stay with you for 24 hours following the procedure.  ?For the next 24 hours, DO NOT: ?-Drive a car ?-Paediatric nurse ?-Drink alcoholic beverages ?-Take any medication unless instructed by your physician ?-Make any legal decisions or sign important papers. ? ?Meals: ?Start with liquid foods such as gelatin or soup. Progress to regular foods as tolerated. Avoid greasy, spicy, heavy foods. If nausea and/or vomiting occur, drink only clear liquids until the nausea and/or vomiting subsides. Call your physician if vomiting continues. ? ?Special Instructions/Symptoms: ?Your throat may feel dry or sore from the anesthesia or the breathing tube placed in your throat during surgery. If this causes discomfort, gargle with warm salt water. The discomfort should disappear within 24 hours. ? ?If you had a scopolamine patch placed behind your ear for the management of post- operative nausea and/or vomiting: ? ?1. The medication in the patch is effective for 72 hours, after which it should be removed.  Wrap patch in a tissue and discard in the trash. Wash hands thoroughly with soap and water. ?2. You may remove the patch earlier than 72 hours if you experience unpleasant side effects which may include dry mouth, dizziness or visual disturbances. ?3. Avoid touching the patch. Wash your hands with soap and water after contact with the patch. ?    ?

## 2022-01-25 NOTE — Transfer of Care (Signed)
Immediate Anesthesia Transfer of Care Note ? ?Patient: [name] ? ?Procedure(s) Performed: LAPAROSCOPIC CHOLECYSTECTOMY (Abdomen) ? ?Patient Location: PACU ? ?Anesthesia Type:General ? ?Level of Consciousness: sedated ? ?Airway & Oxygen Therapy: Patient Spontanous Breathing and Patient connected to face mask oxygen ? ?Post-op Assessment: Report given to RN and Post -op Vital signs reviewed and stable ? ?Post vital signs: Reviewed and stable ? ?Last Vitals:  ?Vitals Value Taken Time  ?BP    ?Temp    ?Pulse 73 01/25/22 1521  ?Resp 19 01/25/22 1522  ?SpO2 99 % 01/25/22 1521  ?Vitals shown include unvalidated device data. ? ?Last Pain:  ?Vitals:  ? 01/25/22 1315  ?TempSrc: Oral  ?PainSc: 0-No pain  ?   ? ?Patients Stated Pain Goal: 3 (01/25/22 1315) ? ?Complications: No notable events documented. ?

## 2022-01-25 NOTE — Anesthesia Preprocedure Evaluation (Addendum)
Anesthesia Evaluation  ?Patient identified by MRN, date of birth, ID band ?Patient awake ? ? ? ?Reviewed: ?Allergy & Precautions, NPO status , Patient's Chart, lab work & pertinent test results ? ?Airway ?Mallampati: III ? ?TM Distance: >3 FB ?Neck ROM: Full ? ? ? Dental ? ?(+) Missing ?  ?Pulmonary ?neg pulmonary ROS,  ?  ?Pulmonary exam normal ? ? ? ? ? ? ? Cardiovascular ?hypertension, Normal cardiovascular exam ? ? ?  ?Neuro/Psych ?PSYCHIATRIC DISORDERS Anxiety Depression negative neurological ROS ?   ? GI/Hepatic ?Neg liver ROS, hiatal hernia, GERD  Medicated and Controlled,  ?Endo/Other  ?negative endocrine ROS ? Renal/GU ?negative Renal ROS  ? ?  ?Musculoskeletal ? ?(+) Arthritis ,  ? Abdominal ?  ?Peds ? Hematology ?negative hematology ROS ?(+)   ?Anesthesia Other Findings ?SYMPTOMATIC GALLSTONES ? Reproductive/Obstetrics ? ?  ? ? ? ? ? ? ? ? ? ? ? ? ? ?  ?  ? ? ? ? ? ? ?Anesthesia Physical ?Anesthesia Plan ? ?ASA: 2 ? ?Anesthesia Plan: General  ? ?Post-op Pain Management:   ? ?Induction: Intravenous ? ?PONV Risk Score and Plan: 3 and Ondansetron, Dexamethasone and Treatment may vary due to age or medical condition ? ?Airway Management Planned: Oral ETT ? ?Additional Equipment:  ? ?Intra-op Plan:  ? ?Post-operative Plan: Extubation in OR ? ?Informed Consent: I have reviewed the patients History and Physical, chart, labs and discussed the procedure including the risks, benefits and alternatives for the proposed anesthesia with the patient or authorized representative who has indicated his/her understanding and acceptance.  ? ? ? ?Dental advisory given ? ?Plan Discussed with: CRNA ? ?Anesthesia Plan Comments:   ? ? ? ? ? ?Anesthesia Quick Evaluation ? ?

## 2022-01-25 NOTE — Anesthesia Postprocedure Evaluation (Signed)
Anesthesia Post Note ? ?Patient: [name] ? ?Procedure(s) Performed: LAPAROSCOPIC CHOLECYSTECTOMY (Abdomen) ? ?  ? ?Patient location during evaluation: PACU ?Anesthesia Type: General ?Level of consciousness: awake and alert ?Pain management: pain level controlled ?Vital Signs Assessment: post-procedure vital signs reviewed and stable ?Respiratory status: spontaneous breathing, nonlabored ventilation and respiratory function stable ?Cardiovascular status: blood pressure returned to baseline and stable ?Postop Assessment: no apparent nausea or vomiting ?Anesthetic complications: no ? ? ?No notable events documented. ? ?Last Vitals:  ?Vitals:  ? 01/25/22 1545 01/25/22 1550  ?BP: (!) 148/76 (!) 147/79  ?Pulse: 80 80  ?Resp: 18 18  ?Temp:    ?SpO2: 97% 98%  ?  ?Last Pain:  ?Vitals:  ? 01/25/22 1550  ?TempSrc:   ?PainSc: 4   ? ? ?  ?  ?  ?  ?  ?  ? ?Donnisha Rainwater ? ? ? ? ?

## 2022-01-25 NOTE — Op Note (Signed)
Laparoscopic Cholecystectomy Procedure Note ? ?Indications: This patient presents with symptomatic gallbladder disease and will undergo laparoscopic cholecystectomy. ? ?Pre-operative Diagnosis: symptomatic cholelithiasis ? ?Post-operative Diagnosis: Same ? ?Surgeon: Coralie Keens  ? ?Assistants: 0 ? ?Anesthesia: General endotracheal anesthesia ? ?ASA Class: 2 ? ?Procedure Details  ?The patient was seen again in the Holding Room. The risks, benefits, complications, treatment options, and expected outcomes were discussed with the patient. The possibilities of reaction to medication, pulmonary aspiration, perforation of viscus, bleeding, recurrent infection, finding a normal gallbladder, the need for additional procedures, failure to diagnose a condition, the possible need to convert to an open procedure, and creating a complication requiring transfusion or operation were discussed with the patient. The likelihood of improving the patient's symptoms with return to their baseline status is good.  The patient and/or family concurred with the proposed plan, giving informed consent. The site of surgery properly noted. The patient was taken to Operating Room, identified as [name] and the procedure verified as Laparoscopic Cholecystectomy with Intraoperative Cholangiogram. A Time Out was held and the above information confirmed. ? ?Prior to the induction of general anesthesia, antibiotic prophylaxis was administered. General endotracheal anesthesia was then administered and tolerated well. After the induction, the abdomen was prepped with Chloraprep and draped in sterile fashion. The patient was positioned in the supine position. ? ?Local anesthetic agent was injected into the skin near the umbilicus and an incision made. We dissected down to the abdominal fascia with blunt dissection.  The fascia was incised vertically and we entered the peritoneal cavity bluntly.  A pursestring suture of 0-Vicryl was placed  around the fascial opening.  The Hasson cannula was inserted and secured with the stay suture.  Pneumoperitoneum was then created with CO2 and tolerated well without any adverse changes in the patient's vital signs. A 5-mm port was placed in the subxiphoid position.  Two 5-mm ports were placed in the right upper quadrant. All skin incisions were infiltrated with a local anesthetic agent before making the incision and placing the trocars.  ? ?We positioned the patient in reverse Trendelenburg, tilted slightly to the patient's left.  The gallbladder was identified, the fundus grasped and retracted cephalad. Adhesions were lysed bluntly and with the electrocautery where indicated, taking care not to injure any adjacent organs or viscus. The infundibulum was grasped and retracted laterally, exposing the peritoneum overlying the triangle of Calot. This was then divided and exposed in a blunt fashion. The cystic duct was clearly identified and bluntly dissected circumferentially. A critical view of the cystic duct and cystic artery was obtained.  The cystic duct was then ligated with clips and divided. The cystic artery was, dissected free, ligated with clips and divided as well.  ? ?The gallbladder was dissected from the liver bed in retrograde fashion with the electrocautery. The gallbladder was removed and placed in an Endocatch sac. The liver bed was irrigated and inspected. Hemostasis was achieved with the electrocautery. Copious irrigation was utilized and was repeatedly aspirated until clear.  The gallbladder and Endocatch sac were then removed through the umbilical port site.  The pursestring suture was used to close the umbilical fascia.   ? ?We again inspected the right upper quadrant for hemostasis.  Pneumoperitoneum was released as we removed the trocars.  4-0 Monocryl was used to close the skin.   Skin glue was then applied. The patient was then extubated and brought to the recovery room in stable condition.  Instrument, sponge, and needle counts were  correct at closure and at the conclusion of the case.  ? ?Findings: ?Chronic Cholecystitis with Cholelithiasis ? ?Estimated Blood Loss: Minimal ?        ?Drains: 0 ?        ?Specimens: Gallbladder     ?      ?Complications: None; patient tolerated the procedure well. ?        ?Disposition: PACU - hemodynamically stable. ?        ?Condition: stable ? ?  ?

## 2022-01-25 NOTE — Anesthesia Procedure Notes (Signed)
Procedure Name: Intubation ?Date/Time: 01/25/2022 2:37 PM ?Performed by: Maryella Shivers, CRNA ?Pre-anesthesia Checklist: Patient identified, Emergency Drugs available, Suction available and Patient being monitored ?Patient Re-evaluated:Patient Re-evaluated prior to induction ?Oxygen Delivery Method: Circle system utilized ?Preoxygenation: Pre-oxygenation with 100% oxygen ?Induction Type: IV induction ?Ventilation: Mask ventilation without difficulty ?Laryngoscope Size: Mac and 3 ?Grade View: Grade I ?Tube type: Oral ?Tube size: 7.0 mm ?Number of attempts: 1 ?Airway Equipment and Method: Stylet and Oral airway ?Placement Confirmation: ETT inserted through vocal cords under direct vision, positive ETCO2 and breath sounds checked- equal and bilateral ?Secured at: 19 cm ?Tube secured with: Tape ?Dental Injury: Teeth and Oropharynx as per pre-operative assessment  ? ? ? ? ?

## 2022-01-25 NOTE — Interval H&P Note (Signed)
History and Physical Interval Note:no change in H and p ? ?01/25/2022 ?1:38 PM ? ?[name]  has presented today for surgery, with the diagnosis of SYMPTOMATIC GALLSTONES.  The various methods of treatment have been discussed with the patient and family. After consideration of risks, benefits and other options for treatment, the patient has consented to  Procedure(s): ?LAPAROSCOPIC CHOLECYSTECTOMY (N/A) as a surgical intervention.  The patient's history has been reviewed, patient examined, no change in status, stable for surgery.  I have reviewed the patient's chart and labs.  Questions were answered to the patient's satisfaction.   ? ? ?Coralie Keens ? ? ?

## 2022-01-26 ENCOUNTER — Encounter (HOSPITAL_BASED_OUTPATIENT_CLINIC_OR_DEPARTMENT_OTHER): Payer: Self-pay | Admitting: Surgery

## 2022-01-27 LAB — SURGICAL PATHOLOGY

## 2022-03-18 ENCOUNTER — Other Ambulatory Visit: Payer: Self-pay | Admitting: Family Medicine

## 2022-07-06 ENCOUNTER — Telehealth: Payer: Self-pay | Admitting: Family Medicine

## 2022-07-06 NOTE — Telephone Encounter (Signed)
Patient would like to come in on 9/27 to get labs for her appointment on 10/2 - please call when orders are added so patient is aware.

## 2022-07-07 ENCOUNTER — Other Ambulatory Visit: Payer: PPO

## 2022-07-07 DIAGNOSIS — E782 Mixed hyperlipidemia: Secondary | ICD-10-CM | POA: Diagnosis not present

## 2022-07-07 DIAGNOSIS — R7303 Prediabetes: Secondary | ICD-10-CM

## 2022-07-07 DIAGNOSIS — I10 Essential (primary) hypertension: Secondary | ICD-10-CM

## 2022-07-07 LAB — BAYER DCA HB A1C WAIVED: HB A1C (BAYER DCA - WAIVED): 5.4 % (ref 4.8–5.6)

## 2022-07-11 LAB — CMP14+EGFR
ALT: 15 IU/L (ref 0–32)
AST: 18 IU/L (ref 0–40)
Albumin/Globulin Ratio: 2.2 (ref 1.2–2.2)
Albumin: 4.4 g/dL (ref 3.8–4.8)
Alkaline Phosphatase: 59 IU/L (ref 44–121)
BUN/Creatinine Ratio: 11 — ABNORMAL LOW (ref 12–28)
BUN: 10 mg/dL (ref 8–27)
Bilirubin Total: 0.5 mg/dL (ref 0.0–1.2)
CO2: 22 mmol/L (ref 20–29)
Calcium: 9.3 mg/dL (ref 8.7–10.3)
Chloride: 100 mmol/L (ref 96–106)
Creatinine, Ser: 0.89 mg/dL (ref 0.57–1.00)
Globulin, Total: 2 g/dL (ref 1.5–4.5)
Glucose: 92 mg/dL (ref 70–99)
Potassium: 4.5 mmol/L (ref 3.5–5.2)
Sodium: 135 mmol/L (ref 134–144)
Total Protein: 6.4 g/dL (ref 6.0–8.5)
eGFR: 68 mL/min/{1.73_m2} (ref >59)

## 2022-07-11 LAB — CBC WITH DIFFERENTIAL/PLATELET
Basophils Absolute: 0.1 10*3/uL (ref 0.0–0.2)
Basos: 1 %
EOS (ABSOLUTE): 0.1 10*3/uL (ref 0.0–0.4)
Eos: 3 %
Hematocrit: 44.4 % (ref 34.0–46.6)
Hemoglobin: 15 g/dL (ref 11.1–15.9)
Immature Grans (Abs): 0 10*3/uL (ref 0.0–0.1)
Immature Granulocytes: 0 %
Lymphocytes Absolute: 1.7 10*3/uL (ref 0.7–3.1)
Lymphs: 35 %
MCH: 28.8 pg (ref 26.6–33.0)
MCHC: 33.8 g/dL (ref 31.5–35.7)
MCV: 85 fL (ref 79–97)
Monocytes Absolute: 0.4 10*3/uL (ref 0.1–0.9)
Monocytes: 8 %
Neutrophils Absolute: 2.5 10*3/uL (ref 1.4–7.0)
Neutrophils: 53 %
Platelets: 225 10*3/uL (ref 150–450)
RBC: 5.2 x10E6/uL (ref 3.77–5.28)
RDW: 13.1 % (ref 11.7–15.4)
WBC: 4.8 10*3/uL (ref 3.4–10.8)

## 2022-07-11 LAB — LIPID PANEL
Chol/HDL Ratio: 3.4 ratio (ref 0.0–4.4)
Cholesterol, Total: 238 mg/dL — ABNORMAL HIGH (ref 100–199)
HDL: 70 mg/dL (ref >39)
LDL Chol Calc (NIH): 149 mg/dL — ABNORMAL HIGH (ref 0–99)
Triglycerides: 108 mg/dL (ref 0–149)
VLDL Cholesterol Cal: 19 mg/dL (ref 5–40)

## 2022-07-12 ENCOUNTER — Encounter: Payer: Medicare Other | Admitting: Family Medicine

## 2022-07-21 ENCOUNTER — Emergency Department (HOSPITAL_COMMUNITY): Payer: PPO

## 2022-07-21 ENCOUNTER — Emergency Department (HOSPITAL_COMMUNITY)
Admission: EM | Admit: 2022-07-21 | Discharge: 2022-07-21 | Disposition: A | Discharge status: Home / Self Care | Payer: PPO | Attending: Student | Admitting: Student

## 2022-07-21 ENCOUNTER — Other Ambulatory Visit: Payer: Self-pay

## 2022-07-21 ENCOUNTER — Encounter (HOSPITAL_COMMUNITY): Payer: Self-pay

## 2022-07-21 DIAGNOSIS — R52 Pain, unspecified: Secondary | ICD-10-CM | POA: Diagnosis not present

## 2022-07-21 DIAGNOSIS — I959 Hypotension, unspecified: Secondary | ICD-10-CM | POA: Diagnosis not present

## 2022-07-21 DIAGNOSIS — S42202A Unspecified fracture of upper end of left humerus, initial encounter for closed fracture: Secondary | ICD-10-CM

## 2022-07-21 DIAGNOSIS — I517 Cardiomegaly: Secondary | ICD-10-CM | POA: Diagnosis not present

## 2022-07-21 DIAGNOSIS — I10 Essential (primary) hypertension: Secondary | ICD-10-CM | POA: Diagnosis not present

## 2022-07-21 DIAGNOSIS — W19XXXA Unspecified fall, initial encounter: Secondary | ICD-10-CM | POA: Diagnosis not present

## 2022-07-21 DIAGNOSIS — S4992XA Unspecified injury of left shoulder and upper arm, initial encounter: Secondary | ICD-10-CM | POA: Diagnosis present

## 2022-07-21 DIAGNOSIS — S42212A Unspecified displaced fracture of surgical neck of left humerus, initial encounter for closed fracture: Secondary | ICD-10-CM | POA: Insufficient documentation

## 2022-07-21 DIAGNOSIS — Y9301 Activity, walking, marching and hiking: Secondary | ICD-10-CM | POA: Insufficient documentation

## 2022-07-21 DIAGNOSIS — Z043 Encounter for examination and observation following other accident: Secondary | ICD-10-CM | POA: Diagnosis not present

## 2022-07-21 DIAGNOSIS — W010XXA Fall on same level from slipping, tripping and stumbling without subsequent striking against object, initial encounter: Secondary | ICD-10-CM | POA: Diagnosis not present

## 2022-07-21 DIAGNOSIS — S62501A Fracture of unspecified phalanx of right thumb, initial encounter for closed fracture: Secondary | ICD-10-CM | POA: Insufficient documentation

## 2022-07-21 DIAGNOSIS — R000 Tachycardia, unspecified: Secondary | ICD-10-CM | POA: Diagnosis not present

## 2022-07-21 DIAGNOSIS — M79641 Pain in right hand: Secondary | ICD-10-CM | POA: Diagnosis not present

## 2022-07-21 DIAGNOSIS — S42252A Displaced fracture of greater tuberosity of left humerus, initial encounter for closed fracture: Secondary | ICD-10-CM | POA: Diagnosis not present

## 2022-07-21 DIAGNOSIS — M25531 Pain in right wrist: Secondary | ICD-10-CM | POA: Diagnosis not present

## 2022-07-21 LAB — CBC WITH DIFFERENTIAL/PLATELET
Abs Immature Granulocytes: 0.03 10*3/uL (ref 0.00–0.07)
Basophils Absolute: 0 10*3/uL (ref 0.0–0.1)
Basophils Relative: 1 %
Eosinophils Absolute: 0.1 10*3/uL (ref 0.0–0.5)
Eosinophils Relative: 1 %
HCT: 46 % (ref 36.0–46.0)
Hemoglobin: 15.3 g/dL — ABNORMAL HIGH (ref 12.0–15.0)
Immature Granulocytes: 0 %
Lymphocytes Relative: 21 %
Lymphs Abs: 1.5 10*3/uL (ref 0.7–4.0)
MCH: 29.2 pg (ref 26.0–34.0)
MCHC: 33.3 g/dL (ref 30.0–36.0)
MCV: 87.8 fL (ref 80.0–100.0)
Monocytes Absolute: 0.5 10*3/uL (ref 0.1–1.0)
Monocytes Relative: 6 %
Neutro Abs: 5.2 10*3/uL (ref 1.7–7.7)
Neutrophils Relative %: 71 %
Platelets: 171 10*3/uL (ref 150–400)
RBC: 5.24 MIL/uL — ABNORMAL HIGH (ref 3.87–5.11)
RDW: 13.2 % (ref 11.5–15.5)
WBC: 7.3 10*3/uL (ref 4.0–10.5)
nRBC: 0 % (ref 0.0–0.2)

## 2022-07-21 LAB — COMPREHENSIVE METABOLIC PANEL
ALT: 38 U/L (ref 0–44)
AST: 87 U/L — ABNORMAL HIGH (ref 15–41)
Albumin: 4 g/dL (ref 3.5–5.0)
Alkaline Phosphatase: 67 U/L (ref 38–126)
Anion gap: 11 (ref 5–15)
BUN: 12 mg/dL (ref 8–23)
CO2: 21 mmol/L — ABNORMAL LOW (ref 22–32)
Calcium: 9.1 mg/dL (ref 8.9–10.3)
Chloride: 109 mmol/L (ref 98–111)
Creatinine, Ser: 0.82 mg/dL (ref 0.44–1.00)
GFR, Estimated: >60 mL/min (ref >60)
Glucose, Bld: 101 mg/dL — ABNORMAL HIGH (ref 70–99)
Potassium: 3.7 mmol/L (ref 3.5–5.1)
Sodium: 141 mmol/L (ref 135–145)
Total Bilirubin: 0.8 mg/dL (ref 0.3–1.2)
Total Protein: 6.8 g/dL (ref 6.5–8.1)

## 2022-07-21 MED ORDER — OXYCODONE-ACETAMINOPHEN 5-325 MG PO TABS: 1.0000 | ORAL_TABLET | Freq: Four times a day (QID) | ORAL | 0 refills | Status: DC | PRN

## 2022-07-21 MED ORDER — FENTANYL CITRATE PF 50 MCG/ML IJ SOSY
50.0000 ug | PREFILLED_SYRINGE | Freq: Once | INTRAMUSCULAR | Status: AC
  Administered 2022-07-21: 50 ug via INTRAVENOUS
  Filled 2022-07-21: qty 1

## 2022-07-21 MED ORDER — HYDROCODONE-ACETAMINOPHEN 5-325 MG PO TABS: 1.0000 | ORAL_TABLET | Freq: Once | ORAL | Status: DC

## 2022-07-21 NOTE — ED Notes (Signed)
Patient transported to X-ray 

## 2022-07-21 NOTE — ED Notes (Signed)
Denies hitting head, neck , or pelvis
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
## 2022-07-22 ENCOUNTER — Encounter: Payer: Self-pay | Admitting: *Deleted

## 2022-07-23 ENCOUNTER — Ambulatory Visit (INDEPENDENT_AMBULATORY_CARE_PROVIDER_SITE_OTHER): Payer: PPO | Admitting: *Deleted

## 2022-07-23 ENCOUNTER — Encounter: Payer: Self-pay | Admitting: Family Medicine

## 2022-07-23 NOTE — Progress Notes (Signed)
Patient came in with daughters.  She is wearing a shoulder sing with a swathe. I enlisted the help of [employer]. We reviewed images on google and were able to straighten out the sling. She is following up with ortho on the 19th of October

## 2022-07-26 ENCOUNTER — Other Ambulatory Visit: Payer: Self-pay | Admitting: *Deleted

## 2022-07-26 MED ORDER — PRAVASTATIN SODIUM 20 MG PO TABS: 20.0000 mg | ORAL_TABLET | Freq: Every day | ORAL | 0 refills | Status: DC

## 2022-07-27 DIAGNOSIS — S62524A Nondisplaced fracture of distal phalanx of right thumb, initial encounter for closed fracture: Secondary | ICD-10-CM | POA: Diagnosis not present

## 2022-07-27 DIAGNOSIS — M79644 Pain in right finger(s): Secondary | ICD-10-CM | POA: Diagnosis not present

## 2022-07-29 ENCOUNTER — Telehealth: Payer: Self-pay | Admitting: Family Medicine

## 2022-07-29 ENCOUNTER — Other Ambulatory Visit: Payer: Self-pay | Admitting: Physician Assistant

## 2022-07-29 ENCOUNTER — Ambulatory Visit
Admission: RE | Admit: 2022-07-29 | Discharge: 2022-07-29 | Disposition: A | Discharge status: Home / Self Care | Payer: PPO | Source: Ambulatory Visit | Attending: Physician Assistant | Admitting: Physician Assistant

## 2022-07-29 ENCOUNTER — Other Ambulatory Visit: Payer: Self-pay

## 2022-07-29 ENCOUNTER — Encounter (HOSPITAL_COMMUNITY): Payer: Self-pay | Admitting: Orthopedic Surgery

## 2022-07-29 DIAGNOSIS — M25512 Pain in left shoulder: Secondary | ICD-10-CM

## 2022-07-29 DIAGNOSIS — S42352A Displaced comminuted fracture of shaft of humerus, left arm, initial encounter for closed fracture: Secondary | ICD-10-CM | POA: Diagnosis not present

## 2022-07-29 DIAGNOSIS — S42212A Unspecified displaced fracture of surgical neck of left humerus, initial encounter for closed fracture: Secondary | ICD-10-CM | POA: Diagnosis not present
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
## 2022-07-29 NOTE — Telephone Encounter (Signed)
Pt is ready to start injection for osteoporosis.  She has recently fell and broken her arm. Surgery is scheduled for 10/20.

## 2022-07-29 NOTE — Progress Notes (Signed)
For Short Stay: [place] appointment date: N/A Date of COVID positive in last 34 days:N/A  Bowel Prep reminder:N/A   For Anesthesia: PCP - Worthy Rancher, MD office visit 10/21/21 in epic Cardiologist - Minus Breeding, MD note seen since 2014  Chest x-ray - 07/21/22 in epic EKG - 07/21/22 in epic Stress Test - greater than 2 years ago ECHO - N/A Cardiac Cath - N/A Pacemaker/ICD device last checked: N/A Pacemaker orders received: N/A Device Rep notified: N/A  Spinal Cord Stimulator:N/A  Sleep Study - N/A CPAP - N/A  Fasting Blood Sugar - N/A Checks Blood Sugar __N/A___ times a day Date and result of last Hgb A1c- 07/07/22 5.4 in epic  Blood Thinner Instructions: N/A Aspirin Instructions: N/A Last Dose: N/A  Activity level: Able to exercise without chest pain and/or shortness of breath       Anesthesia review:   Patient denies shortness of breath, fever, cough and chest pain at PAT appointment   Patient verbalized understanding of instructions that were given to them at the PAT appointment. Patient was also instructed that they will need to review over the PAT instructions again at home before surgery.

## 2022-07-29 NOTE — Telephone Encounter (Signed)
Please have patient schedule with pharmacy clinic 27mn appt for osteoporosis We will likely start prolia which will take a few weeks for approval The cost is usually around $250 every 6 months if you will let patient know

## 2022-07-30 ENCOUNTER — Ambulatory Visit (HOSPITAL_BASED_OUTPATIENT_CLINIC_OR_DEPARTMENT_OTHER): Payer: PPO | Admitting: Certified Registered Nurse Anesthetist

## 2022-07-30 ENCOUNTER — Ambulatory Visit (HOSPITAL_COMMUNITY): Payer: PPO

## 2022-07-30 ENCOUNTER — Encounter (HOSPITAL_COMMUNITY)
Admission: RE | Disposition: A | Discharge status: Home / Self Care | Payer: Self-pay | Source: Home / Self Care | Attending: Orthopedic Surgery

## 2022-07-30 ENCOUNTER — Observation Stay (HOSPITAL_COMMUNITY)
Admission: RE | Admit: 2022-07-30 | Discharge: 2022-07-31 | Disposition: A | Discharge status: Home / Self Care | Payer: PPO | Attending: Orthopedic Surgery | Admitting: Orthopedic Surgery

## 2022-07-30 ENCOUNTER — Other Ambulatory Visit: Payer: Self-pay

## 2022-07-30 ENCOUNTER — Ambulatory Visit (HOSPITAL_COMMUNITY): Payer: PPO | Admitting: Certified Registered Nurse Anesthetist

## 2022-07-30 ENCOUNTER — Encounter (HOSPITAL_COMMUNITY): Payer: Self-pay | Admitting: Orthopedic Surgery

## 2022-07-30 DIAGNOSIS — S42292A Other displaced fracture of upper end of left humerus, initial encounter for closed fracture: Secondary | ICD-10-CM | POA: Diagnosis not present

## 2022-07-30 DIAGNOSIS — G8918 Other acute postprocedural pain: Secondary | ICD-10-CM | POA: Diagnosis not present

## 2022-07-30 DIAGNOSIS — Z01818 Encounter for other preprocedural examination: Secondary | ICD-10-CM | POA: Diagnosis not present

## 2022-07-30 DIAGNOSIS — S42352A Displaced comminuted fracture of shaft of humerus, left arm, initial encounter for closed fracture: Secondary | ICD-10-CM | POA: Diagnosis not present

## 2022-07-30 DIAGNOSIS — X58XXXA Exposure to other specified factors, initial encounter: Secondary | ICD-10-CM | POA: Diagnosis not present

## 2022-07-30 DIAGNOSIS — Z79899 Other long term (current) drug therapy: Secondary | ICD-10-CM | POA: Insufficient documentation

## 2022-07-30 DIAGNOSIS — E119 Type 2 diabetes mellitus without complications: Secondary | ICD-10-CM | POA: Insufficient documentation

## 2022-07-30 DIAGNOSIS — Z96652 Presence of left artificial knee joint: Secondary | ICD-10-CM | POA: Insufficient documentation

## 2022-07-30 DIAGNOSIS — I10 Essential (primary) hypertension: Secondary | ICD-10-CM

## 2022-07-30 DIAGNOSIS — F418 Other specified anxiety disorders: Secondary | ICD-10-CM | POA: Diagnosis not present

## 2022-07-30 DIAGNOSIS — Z96612 Presence of left artificial shoulder joint: Secondary | ICD-10-CM

## 2022-07-30 HISTORY — PX: ORIF HUMERUS FRACTURE: SHX2126

## 2022-07-30 HISTORY — DX: Fatty (change of) liver, not elsewhere classified: K76.0

## 2022-07-30 HISTORY — DX: Type 2 diabetes mellitus without complications: E11.9

## 2022-07-30 LAB — GLUCOSE, CAPILLARY: Glucose-Capillary: 107 mg/dL — ABNORMAL HIGH (ref 70–99)

## 2022-07-30 SURGERY — OPEN REDUCTION INTERNAL FIXATION (ORIF) PROXIMAL HUMERUS FRACTURE
Anesthesia: Regional | Site: Shoulder | Laterality: Left

## 2022-07-30 MED ORDER — METHOCARBAMOL 500 MG IVPB - SIMPLE MED: 500.0000 mg | Freq: Four times a day (QID) | INTRAVENOUS | Status: DC | PRN

## 2022-07-30 MED ORDER — METHOCARBAMOL 500 MG PO TABS: 500.0000 mg | ORAL_TABLET | Freq: Four times a day (QID) | ORAL | Status: DC | PRN

## 2022-07-30 MED ORDER — PHENYLEPHRINE HCL (PRESSORS) 10 MG/ML IV SOLN
INTRAVENOUS | Status: AC
  Filled 2022-07-30: qty 1

## 2022-07-30 MED ORDER — PHENYLEPHRINE HCL-NACL 20-0.9 MG/250ML-% IV SOLN
INTRAVENOUS | Status: DC | PRN
  Administered 2022-07-30: 40 ug/min via INTRAVENOUS

## 2022-07-30 MED ORDER — ONDANSETRON HCL 4 MG/2ML IJ SOLN: 4.0000 mg | Freq: Four times a day (QID) | INTRAMUSCULAR | Status: DC | PRN

## 2022-07-30 MED ORDER — MAGNESIUM 500 MG PO CAPS: 500.0000 mg | ORAL_CAPSULE | Freq: Every day | ORAL | Status: DC

## 2022-07-30 MED ORDER — OXYCODONE-ACETAMINOPHEN 5-325 MG PO TABS: 1.0000 | ORAL_TABLET | Freq: Four times a day (QID) | ORAL | 0 refills | Status: DC | PRN

## 2022-07-30 MED ORDER — ACETAMINOPHEN 325 MG PO TABS: 650.0000 mg | ORAL_TABLET | ORAL | Status: DC | PRN

## 2022-07-30 MED ORDER — ADULT MULTIVITAMIN W/MINERALS CH
1.0000 | ORAL_TABLET | Freq: Every day | ORAL | Status: DC
  Administered 2022-07-31: 1 via ORAL
  Filled 2022-07-30: qty 1

## 2022-07-30 MED ORDER — ORAL CARE MOUTH RINSE
15.0000 mL | Freq: Once | OROMUCOSAL | Status: AC
  Administered 2022-07-30: 15 mL via OROMUCOSAL

## 2022-07-30 MED ORDER — DEXAMETHASONE SODIUM PHOSPHATE 10 MG/ML IJ SOLN
INTRAMUSCULAR | Status: DC | PRN
  Administered 2022-07-30: 4 mg via INTRAVENOUS

## 2022-07-30 MED ORDER — PRAVASTATIN SODIUM 20 MG PO TABS
20.0000 mg | ORAL_TABLET | Freq: Every day | ORAL | Status: DC
  Administered 2022-07-30: 20 mg via ORAL
  Filled 2022-07-30: qty 1

## 2022-07-30 MED ORDER — OMEGA-3-ACID ETHYL ESTERS 1 G PO CAPS
1.0000 g | ORAL_CAPSULE | Freq: Two times a day (BID) | ORAL | Status: DC
  Administered 2022-07-30 – 2022-07-31 (×2): 1 g via ORAL
  Filled 2022-07-30 (×2): qty 1

## 2022-07-30 MED ORDER — FENTANYL CITRATE (PF) 250 MCG/5ML IJ SOLN
INTRAMUSCULAR | Status: AC
  Filled 2022-07-30: qty 5

## 2022-07-30 MED ORDER — ZINC SULFATE 220 (50 ZN) MG PO CAPS
220.0000 mg | ORAL_CAPSULE | Freq: Every day | ORAL | Status: DC
  Administered 2022-07-31: 220 mg via ORAL
  Filled 2022-07-30: qty 1

## 2022-07-30 MED ORDER — FAMOTIDINE 20 MG PO TABS
20.0000 mg | ORAL_TABLET | Freq: Two times a day (BID) | ORAL | Status: DC
  Administered 2022-07-30 – 2022-07-31 (×2): 20 mg via ORAL
  Filled 2022-07-30 (×2): qty 1

## 2022-07-30 MED ORDER — MULTIVITAMINS PO CAPS: 1.0000 | ORAL_CAPSULE | Freq: Every day | ORAL | Status: DC

## 2022-07-30 MED ORDER — ONDANSETRON HCL 4 MG/2ML IJ SOLN
INTRAMUSCULAR | Status: DC | PRN
  Administered 2022-07-30: 4 mg via INTRAVENOUS

## 2022-07-30 MED ORDER — ACETAMINOPHEN 325 MG PO TABS: 325.0000 mg | ORAL_TABLET | Freq: Four times a day (QID) | ORAL | Status: DC | PRN

## 2022-07-30 MED ORDER — ROCURONIUM BROMIDE 10 MG/ML (PF) SYRINGE
PREFILLED_SYRINGE | INTRAVENOUS | Status: DC | PRN
  Administered 2022-07-30: 40 mg via INTRAVENOUS

## 2022-07-30 MED ORDER — HYOSCYAMINE SULFATE 0.125 MG SL SUBL: 0.1250 mg | SUBLINGUAL_TABLET | Freq: Four times a day (QID) | SUBLINGUAL | Status: DC | PRN

## 2022-07-30 MED ORDER — MENTHOL 3 MG MT LOZG: 1.0000 | LOZENGE | OROMUCOSAL | Status: DC | PRN

## 2022-07-30 MED ORDER — TRANEXAMIC ACID-NACL 1000-0.7 MG/100ML-% IV SOLN
1000.0000 mg | Freq: Once | INTRAVENOUS | Status: AC
  Administered 2022-07-30: 1000 mg via INTRAVENOUS
  Filled 2022-07-30: qty 100

## 2022-07-30 MED ORDER — HYOSCYAMINE SULFATE ER 0.375 MG PO TB12
0.3750 mg | ORAL_TABLET | Freq: Two times a day (BID) | ORAL | Status: DC
  Administered 2022-07-30 – 2022-07-31 (×2): 0.375 mg via ORAL
  Filled 2022-07-30 (×2): qty 1

## 2022-07-30 MED ORDER — PROMETHAZINE HCL 25 MG/ML IJ SOLN: 6.2500 mg | INTRAMUSCULAR | Status: DC | PRN

## 2022-07-30 MED ORDER — VITAMIN D 25 MCG (1000 UNIT) PO TABS
1000.0000 [IU] | ORAL_TABLET | Freq: Every day | ORAL | Status: DC
  Administered 2022-07-31: 1000 [IU] via ORAL
  Filled 2022-07-30: qty 1

## 2022-07-30 MED ORDER — BUPIVACAINE LIPOSOME 1.3 % IJ SUSP
INTRAMUSCULAR | Status: DC | PRN
  Administered 2022-07-30: 10 mL via PERINEURAL

## 2022-07-30 MED ORDER — OYSTER SHELL CALCIUM/D3 500-5 MG-MCG PO TABS
1.0000 | ORAL_TABLET | Freq: Every day | ORAL | Status: DC
  Administered 2022-07-31: 1 via ORAL
  Filled 2022-07-30: qty 1

## 2022-07-30 MED ORDER — TRIAMCINOLONE ACETONIDE 55 MCG/ACT NA AERO
1.0000 | INHALATION_SPRAY | Freq: Every day | NASAL | Status: DC
  Administered 2022-07-31: 1 via NASAL
  Filled 2022-07-30: qty 10.8

## 2022-07-30 MED ORDER — FENTANYL CITRATE (PF) 100 MCG/2ML IJ SOLN
INTRAMUSCULAR | Status: DC | PRN
  Administered 2022-07-30: 50 ug via INTRAVENOUS

## 2022-07-30 MED ORDER — OXYCODONE HCL 5 MG PO TABS: 5.0000 mg | ORAL_TABLET | Freq: Once | ORAL | Status: DC | PRN

## 2022-07-30 MED ORDER — COENZYME Q10 200 MG PO CAPS: 200.0000 mg | ORAL_CAPSULE | Freq: Every day | ORAL | Status: DC

## 2022-07-30 MED ORDER — ALUM HYDROXIDE-MAG CARBONATE 95-358 MG/15ML PO SUSP: 15.0000 mL | ORAL | Status: DC | PRN

## 2022-07-30 MED ORDER — FENTANYL CITRATE PF 50 MCG/ML IJ SOSY
50.0000 ug | PREFILLED_SYRINGE | INTRAMUSCULAR | Status: DC
  Administered 2022-07-30: 50 ug via INTRAVENOUS
  Filled 2022-07-30: qty 2

## 2022-07-30 MED ORDER — METOCLOPRAMIDE HCL 5 MG PO TABS: 5.0000 mg | ORAL_TABLET | Freq: Three times a day (TID) | ORAL | Status: DC | PRN

## 2022-07-30 MED ORDER — LIDOCAINE 2% (20 MG/ML) 5 ML SYRINGE
INTRAMUSCULAR | Status: DC | PRN
  Administered 2022-07-30: 40 mg via INTRAVENOUS

## 2022-07-30 MED ORDER — ONDANSETRON HCL 4 MG PO TABS: 4.0000 mg | ORAL_TABLET | Freq: Four times a day (QID) | ORAL | Status: DC | PRN

## 2022-07-30 MED ORDER — METOCLOPRAMIDE HCL 5 MG/ML IJ SOLN: 5.0000 mg | Freq: Three times a day (TID) | INTRAMUSCULAR | Status: DC | PRN

## 2022-07-30 MED ORDER — OXYCODONE HCL 5 MG PO TABS
5.0000 mg | ORAL_TABLET | ORAL | Status: DC | PRN
  Administered 2022-07-31: 5 mg via ORAL
  Filled 2022-07-30: qty 1

## 2022-07-30 MED ORDER — CHLORHEXIDINE GLUCONATE 0.12 % MT SOLN: 15.0000 mL | Freq: Once | OROMUCOSAL | Status: AC

## 2022-07-30 MED ORDER — HYDROMORPHONE HCL 1 MG/ML IJ SOLN: 0.5000 mg | INTRAMUSCULAR | Status: DC | PRN

## 2022-07-30 MED ORDER — POLYETHYLENE GLYCOL 3350 17 G PO PACK: 17.0000 g | PACK | Freq: Every day | ORAL | Status: DC | PRN

## 2022-07-30 MED ORDER — PHENOL 1.4 % MT LIQD: 1.0000 | OROMUCOSAL | Status: DC | PRN

## 2022-07-30 MED ORDER — OXYCODONE HCL 5 MG/5ML PO SOLN: 5.0000 mg | Freq: Once | ORAL | Status: DC | PRN

## 2022-07-30 MED ORDER — HYDROMORPHONE HCL 1 MG/ML IJ SOLN: 0.2500 mg | INTRAMUSCULAR | Status: DC | PRN

## 2022-07-30 MED ORDER — MAGNESIUM OXIDE -MG SUPPLEMENT 400 (240 MG) MG PO TABS
400.0000 mg | ORAL_TABLET | Freq: Every day | ORAL | Status: DC
  Administered 2022-07-31: 400 mg via ORAL
  Filled 2022-07-30: qty 1

## 2022-07-30 MED ORDER — SUGAMMADEX SODIUM 200 MG/2ML IV SOLN
INTRAVENOUS | Status: DC | PRN
  Administered 2022-07-30: 100 mg via INTRAVENOUS
  Administered 2022-07-30: 50 mg via INTRAVENOUS

## 2022-07-30 MED ORDER — LACTATED RINGERS IV SOLN: INTRAVENOUS | Status: DC

## 2022-07-30 MED ORDER — BUPIVACAINE HCL (PF) 0.5 % IJ SOLN
INTRAMUSCULAR | Status: DC | PRN
  Administered 2022-07-30: 20 mL via PERINEURAL

## 2022-07-30 MED ORDER — CEFAZOLIN SODIUM-DEXTROSE 2-4 GM/100ML-% IV SOLN
2.0000 g | Freq: Four times a day (QID) | INTRAVENOUS | Status: AC
  Administered 2022-07-30 – 2022-07-31 (×3): 2 g via INTRAVENOUS
  Filled 2022-07-30 (×3): qty 100

## 2022-07-30 MED ORDER — PHENYLEPHRINE 80 MCG/ML (10ML) SYRINGE FOR IV PUSH (FOR BLOOD PRESSURE SUPPORT)
PREFILLED_SYRINGE | INTRAVENOUS | Status: DC | PRN
  Administered 2022-07-30: 80 ug via INTRAVENOUS
  Administered 2022-07-30: 160 ug via INTRAVENOUS

## 2022-07-30 MED ORDER — BUPIVACAINE-EPINEPHRINE (PF) 0.25% -1:200000 IJ SOLN
INTRAMUSCULAR | Status: DC | PRN
  Administered 2022-07-30: 10 mL via PERINEURAL

## 2022-07-30 MED ORDER — MIDAZOLAM HCL 2 MG/2ML IJ SOLN
1.0000 mg | INTRAMUSCULAR | Status: DC
  Administered 2022-07-30: 2 mg via INTRAVENOUS
  Filled 2022-07-30: qty 2

## 2022-07-30 MED ORDER — PROPOFOL 10 MG/ML IV BOLUS
INTRAVENOUS | Status: DC | PRN
  Administered 2022-07-30: 130 mg via INTRAVENOUS

## 2022-07-30 MED ORDER — BISACODYL 10 MG RE SUPP: 10.0000 mg | Freq: Every day | RECTAL | Status: DC | PRN

## 2022-07-30 MED ORDER — SODIUM CHLORIDE 0.9 % IV SOLN: INTRAVENOUS | Status: DC

## 2022-07-30 MED ORDER — DOCUSATE SODIUM 100 MG PO CAPS
100.0000 mg | ORAL_CAPSULE | Freq: Two times a day (BID) | ORAL | Status: DC
  Administered 2022-07-30 – 2022-07-31 (×2): 100 mg via ORAL
  Filled 2022-07-30 (×2): qty 1

## 2022-07-30 MED ORDER — AMISULPRIDE (ANTIEMETIC) 5 MG/2ML IV SOLN: 10.0000 mg | Freq: Once | INTRAVENOUS | Status: DC | PRN

## 2022-07-30 MED ORDER — TRANEXAMIC ACID-NACL 1000-0.7 MG/100ML-% IV SOLN
1000.0000 mg | INTRAVENOUS | Status: AC
  Administered 2022-07-30: 1000 mg via INTRAVENOUS
  Filled 2022-07-30: qty 100

## 2022-07-30 MED ORDER — CEFAZOLIN SODIUM-DEXTROSE 2-4 GM/100ML-% IV SOLN
2.0000 g | INTRAVENOUS | Status: AC
  Administered 2022-07-30: 2 g via INTRAVENOUS
  Filled 2022-07-30: qty 100

## 2022-07-30 MED ORDER — BUPIVACAINE-EPINEPHRINE (PF) 0.25% -1:200000 IJ SOLN
INTRAMUSCULAR | Status: AC
  Filled 2022-07-30: qty 30

## 2022-07-30 MED ORDER — SODIUM CHLORIDE 0.9 % IR SOLN
Status: DC | PRN
  Administered 2022-07-30: 1000 mL

## 2022-07-30 SURGICAL SUPPLY — 70 items
BAG COUNTER SPONGE SURGICOUNT (BAG) IMPLANT
BAG ZIPLOCK 12X15 (MISCELLANEOUS) IMPLANT
BIT DRILL 1.6MX128 (BIT) IMPLANT
BIT DRILL 170X2.5X (BIT) IMPLANT
BIT DRL 170X2.5X (BIT) ×1
BLADE SAG 18X100X1.27 (BLADE) ×1 IMPLANT
CEMENT HV SMART SET (Cement) IMPLANT
COVER BACK TABLE 60X90IN (DRAPES) ×1 IMPLANT
COVER SURGICAL LIGHT HANDLE (MISCELLANEOUS) ×1 IMPLANT
CUP D38 DXTEND STAND PLUS 6 HU (Orthopedic Implant) ×1 IMPLANT
CUP STD D38 DXTEND PLUS 6 HU (Orthopedic Implant) IMPLANT
DRAPE INCISE IOBAN 66X45 STRL (DRAPES) ×1 IMPLANT
DRAPE ORTHO SPLIT 77X108 STRL (DRAPES) ×2
DRAPE SHEET LG 3/4 BI-LAMINATE (DRAPES) ×1 IMPLANT
DRAPE SURG ORHT 6 SPLT 77X108 (DRAPES) ×2 IMPLANT
DRAPE TOP 10253 STERILE (DRAPES) ×1 IMPLANT
DRAPE U-SHAPE 47X51 STRL (DRAPES) ×1 IMPLANT
DRILL 2.5 (BIT) ×1
DRSG ADAPTIC 3X8 NADH LF (GAUZE/BANDAGES/DRESSINGS) ×1 IMPLANT
DURAPREP 26ML APPLICATOR (WOUND CARE) ×1 IMPLANT
ELECT BLADE TIP CTD 4 INCH (ELECTRODE) ×1 IMPLANT
ELECT NDL TIP 2.8 STRL (NEEDLE) ×1 IMPLANT
ELECT NEEDLE TIP 2.8 STRL (NEEDLE) ×1 IMPLANT
ELECT REM PT RETURN 15FT ADLT (MISCELLANEOUS) ×1 IMPLANT
FACESHIELD WRAPAROUND (MASK) ×1 IMPLANT
FACESHIELD WRAPAROUND OR TEAM (MASK) ×1 IMPLANT
GAUZE PAD ABD 8X10 STRL (GAUZE/BANDAGES/DRESSINGS) ×1 IMPLANT
GAUZE SPONGE 4X4 12PLY STRL (GAUZE/BANDAGES/DRESSINGS) ×1 IMPLANT
GLENOSPHERE DELTA XTEND LAT 38 (Miscellaneous) IMPLANT
GLOVE BIOGEL PI IND STRL 7.5 (GLOVE) ×1 IMPLANT
GLOVE BIOGEL PI IND STRL 8.5 (GLOVE) ×1 IMPLANT
GLOVE ORTHO TXT STRL SZ7.5 (GLOVE) ×1 IMPLANT
GLOVE SURG ORTHO 8.5 STRL (GLOVE) ×1 IMPLANT
GOWN STRL REUS W/ TWL XL LVL3 (GOWN DISPOSABLE) ×2 IMPLANT
GOWN STRL REUS W/TWL XL LVL3 (GOWN DISPOSABLE) ×2
KIT BASIN OR (CUSTOM PROCEDURE TRAY) ×1 IMPLANT
KIT TURNOVER KIT A (KITS) IMPLANT
MANIFOLD NEPTUNE II (INSTRUMENTS) ×1 IMPLANT
METAGLENE DELTA EXTEND (Trauma) IMPLANT
METAGLENE DXTEND (Trauma) ×1 IMPLANT
NDL MAYO CATGUT SZ4 TPR NDL (NEEDLE) ×1 IMPLANT
NEEDLE MAYO CATGUT SZ4 (NEEDLE) ×1 IMPLANT
NS IRRIG 1000ML POUR BTL (IV SOLUTION) ×1 IMPLANT
PACK SHOULDER (CUSTOM PROCEDURE TRAY) ×1 IMPLANT
PIN GUIDE 1.2 (PIN) IMPLANT
PIN GUIDE GLENOPHERE 1.5MX300M (PIN) IMPLANT
PIN METAGLENE 2.5 (PIN) IMPLANT
PROTECTOR NERVE ULNAR (MISCELLANEOUS) ×1 IMPLANT
RESTRAINT HEAD UNIVERSAL NS (MISCELLANEOUS) ×1 IMPLANT
SCREW 4.5X36MM (Screw) IMPLANT
SLING ARM FOAM STRAP LRG (SOFTGOODS) IMPLANT
SMARTMIX MINI TOWER (MISCELLANEOUS) ×1
SPIKE FLUID TRANSFER (MISCELLANEOUS) ×1 IMPLANT
SPONGE T-LAP 4X18 ~~LOC~~+RFID (SPONGE) ×1 IMPLANT
STEM HUMERAL SZ8 STANDARD (Stem) ×1 IMPLANT
STEM HUMERAL SZ8 STD (Stem) IMPLANT
STEM LEFT POROCOAT REV FX EPI (Stem) IMPLANT
STRIP CLOSURE SKIN 1/2X4 (GAUZE/BANDAGES/DRESSINGS) ×1 IMPLANT
STRIP CLOSURE SKIN 1/4X4 (GAUZE/BANDAGES/DRESSINGS) IMPLANT
SUCTION FRAZIER HANDLE 10FR (MISCELLANEOUS)
SUCTION TUBE FRAZIER 10FR DISP (MISCELLANEOUS) ×1 IMPLANT
SUT FIBERWIRE #2 38 T-5 BLUE (SUTURE) ×5
SUT MNCRL AB 4-0 PS2 18 (SUTURE) ×1 IMPLANT
SUT VIC AB 0 CT1 36 (SUTURE) ×2 IMPLANT
SUT VIC AB 0 CT2 27 (SUTURE) ×1 IMPLANT
SUT VIC AB 2-0 CT1 27 (SUTURE) ×1
SUT VIC AB 2-0 CT1 TAPERPNT 27 (SUTURE) ×1 IMPLANT
SUTURE FIBERWR #2 38 T-5 BLUE (SUTURE) ×2 IMPLANT
TOWEL OR 17X26 10 PK STRL BLUE (TOWEL DISPOSABLE) ×1 IMPLANT
TOWER SMARTMIX MINI (MISCELLANEOUS) IMPLANT

## 2022-07-30 NOTE — Brief Op Note (Signed)
07/30/2022  6:10 PM  PATIENT:  [name]  76 y.o. female  PRE-OPERATIVE DIAGNOSIS:  left proximal humerus fracture, displaced and comminuted  POST-OPERATIVE DIAGNOSIS:  left proximal humerus fracture, displaced and comminuted  PROCEDURE:  Procedure(s) with comments: REVERSE TOTAL SHOULDER ARTHROPLASTY   (Left) - 120 min choice and general  SURGEON:  Surgeon(s) and Role:    Netta Cedars, MD - Primary  PHYSICIAN ASSISTANT:   ASSISTANTS: Ventura Bruns, PA-C   ANESTHESIA:   regional and general  EBL:  200 mL   BLOOD ADMINISTERED:none  DRAINS: none   LOCAL MEDICATIONS USED:  MARCAINE     SPECIMEN:  No Specimen  DISPOSITION OF SPECIMEN:  N/A  COUNTS:  YES  TOURNIQUET:  * No tourniquets in log *  DICTATION: .Other Dictation: Dictation Number [id number]  PLAN OF CARE: Admit for overnight observation  PATIENT DISPOSITION:  PACU - hemodynamically stable.   Delay start of Pharmacological VTE agent (>24hrs) due to surgical blood loss or risk of bleeding: not applicable

## 2022-07-30 NOTE — Transfer of Care (Signed)
Immediate Anesthesia Transfer of Care Note  Patient: [name]  Procedure(s) Performed: REVERSE TOTAL SHOULDER ARTHROPLASTY   (Left: Shoulder)  Patient Location: PACU  Anesthesia Type:GA combined with regional for post-op pain  Level of Consciousness: drowsy and patient cooperative  Airway & Oxygen Therapy: Patient Spontanous Breathing and Patient connected to face mask oxygen  Post-op Assessment: Report given to RN and Post -op Vital signs reviewed and stable  Post vital signs: Reviewed and stable  Last Vitals:  Vitals Value Taken Time  BP 144/69 07/30/22 1830  Temp    Pulse 92 07/30/22 1831  Resp 15 07/30/22 1831  SpO2 97 % 07/30/22 1831  Vitals shown include unvalidated device data.  Last Pain:  Vitals:   07/30/22 1515  TempSrc:   PainSc: Asleep         Complications: No notable events documented.

## 2022-07-30 NOTE — Interval H&P Note (Signed)
History and Physical Interval Note:  07/30/2022 2:03 PM  [name]  has presented today for surgery, with the diagnosis of left proximal humerus fracture.  The various methods of treatment have been discussed with the patient and family. After consideration of risks, benefits and other options for treatment, the patient has consented to  Procedure(s) with comments: OPEN REDUCTION INTERNAL FIXATION (ORIF) PROXIMAL HUMERUS FRACTURE  VS reverse TSA (Left) - 120 min choice and general as a surgical intervention.  The patient's history has been reviewed, patient examined, no change in status, stable for surgery.  I have reviewed the patient's chart and labs.  Questions were answered to the patient's satisfaction.     Augustin Schooling

## 2022-07-30 NOTE — Anesthesia Preprocedure Evaluation (Signed)
Anesthesia Evaluation  Patient identified by MRN, date of birth, ID band Patient awake    Reviewed: Allergy & Precautions, NPO status , Patient's Chart, lab work & pertinent test results  Airway Mallampati: III  TM Distance: >3 FB Neck ROM: Full    Dental  (+) Missing   Pulmonary neg pulmonary ROS,    Pulmonary exam normal        Cardiovascular hypertension, Pt. on medications Normal cardiovascular exam     Neuro/Psych PSYCHIATRIC DISORDERS Anxiety Depression negative neurological ROS     GI/Hepatic Neg liver ROS, hiatal hernia, GERD  Medicated and Controlled,  Endo/Other  negative endocrine ROSdiabetes, Type 2  Renal/GU negative Renal ROS     Musculoskeletal  (+) Arthritis , Osteoarthritis,    Abdominal   Peds  Hematology negative hematology ROS (+)   Anesthesia Other Findings   Reproductive/Obstetrics                             Anesthesia Physical  Anesthesia Plan  ASA: 3  Anesthesia Plan: General and Regional   Post-op Pain Management: Regional block* and Minimal or no pain anticipated   Induction: Intravenous  PONV Risk Score and Plan: 3 and Ondansetron, Dexamethasone, Treatment may vary due to age or medical condition and Midazolam  Airway Management Planned: Oral ETT  Additional Equipment:   Intra-op Plan:   Post-operative Plan: Extubation in OR  Informed Consent: I have reviewed the patients History and Physical, chart, labs and discussed the procedure including the risks, benefits and alternatives for the proposed anesthesia with the patient or authorized representative who has indicated his/her understanding and acceptance.     Dental advisory given  Plan Discussed with: CRNA  Anesthesia Plan Comments:         Anesthesia Quick Evaluation

## 2022-07-30 NOTE — Anesthesia Procedure Notes (Signed)
Procedure Name: Intubation Date/Time: 07/30/2022 4:19 PM  Performed by: Montel Clock, CRNAPre-anesthesia Checklist: Patient identified, Emergency Drugs available, Suction available, Patient being monitored and Timeout performed Patient Re-evaluated:Patient Re-evaluated prior to induction Oxygen Delivery Method: Circle system utilized Preoxygenation: Pre-oxygenation with 100% oxygen Induction Type: IV induction Ventilation: Mask ventilation without difficulty and Oral airway inserted - appropriate to patient size Laryngoscope Size: Mac and 3 Grade View: Grade I Tube type: Oral Tube size: 7.0 mm Number of attempts: 1 Airway Equipment and Method: Stylet Placement Confirmation: ETT inserted through vocal cords under direct vision, positive ETCO2 and breath sounds checked- equal and bilateral Secured at: 21 cm Tube secured with: Tape Dental Injury: Teeth and Oropharynx as per pre-operative assessment

## 2022-07-30 NOTE — Anesthesia Procedure Notes (Signed)
Anesthesia Regional Block: Interscalene brachial plexus block   Pre-Anesthetic Checklist: , timeout performed,  Correct Patient, Correct Site, Correct Laterality,  Correct Procedure, Correct Position, site marked,  Risks and benefits discussed,  Surgical consent,  Pre-op evaluation,  At surgeon's request and post-op pain management  Laterality: Left  Prep: chloraprep       Needles:  Injection technique: Single-shot  Needle Type: Stimiplex     Needle Length: 9cm  Needle Gauge: 21     Additional Needles:   Procedures:,,,, ultrasound used (permanent image in chart),,    Narrative:  Start time: 07/30/2022 2:53 PM End time: 07/30/2022 2:58 PM Injection made incrementally with aspirations every 5 mL.  Performed by: Personally  Anesthesiologist: Irmgard Rainwater, MD

## 2022-07-30 NOTE — Anesthesia Postprocedure Evaluation (Signed)
Anesthesia Post Note  Patient: [name]  Procedure(s) Performed: REVERSE TOTAL SHOULDER ARTHROPLASTY   (Left: Shoulder)     Patient location during evaluation: PACU Anesthesia Type: Regional and General Level of consciousness: awake Pain management: pain level controlled Vital Signs Assessment: post-procedure vital signs reviewed and stable Respiratory status: spontaneous breathing Cardiovascular status: stable Postop Assessment: no apparent nausea or vomiting Anesthetic complications: no   No notable events documented.  Last Vitals:  Vitals:   07/30/22 1945 07/30/22 2000  BP: (!) 144/82 128/76  Pulse: 91 87  Resp: 19 15  Temp:  36.8 C  SpO2: 95% 96%    Last Pain:  Vitals:   07/30/22 2000  TempSrc:   PainSc: 0-No pain                 Paizlie Klaus

## 2022-07-30 NOTE — Discharge Instructions (Signed)
Ice to the shoulder constantly.  Keep the incision covered and clean and dry for one week, then ok to get it wet in the shower. ° °Do exercise as instructed several times per day. ° °DO NOT reach behind your back or push up out of a chair with the operative arm. ° °Use a sling while you are up and around for comfort, may remove while seated.  Keep pillow propped behind the operative elbow. ° °Follow up with Dr Ashiyah Pavlak in two weeks in the office, call [phone] for appt °

## 2022-07-31 DIAGNOSIS — S42352A Displaced comminuted fracture of shaft of humerus, left arm, initial encounter for closed fracture: Secondary | ICD-10-CM | POA: Diagnosis not present

## 2022-07-31 LAB — BASIC METABOLIC PANEL
Anion gap: 6 (ref 5–15)
BUN: 11 mg/dL (ref 8–23)
CO2: 27 mmol/L (ref 22–32)
Calcium: 8.9 mg/dL (ref 8.9–10.3)
Chloride: 109 mmol/L (ref 98–111)
Creatinine, Ser: 0.81 mg/dL (ref 0.44–1.00)
GFR, Estimated: >60 mL/min (ref >60)
Glucose, Bld: 139 mg/dL — ABNORMAL HIGH (ref 70–99)
Potassium: 4.3 mmol/L (ref 3.5–5.1)
Sodium: 142 mmol/L (ref 135–145)

## 2022-07-31 LAB — HEMOGLOBIN AND HEMATOCRIT, BLOOD
HCT: 38.4 % (ref 36.0–46.0)
Hemoglobin: 12.5 g/dL (ref 12.0–15.0)

## 2022-07-31 MED ORDER — TRAMADOL HCL 50 MG PO TABS: 50.0000 mg | ORAL_TABLET | Freq: Four times a day (QID) | ORAL | 0 refills | Status: DC | PRN

## 2022-07-31 NOTE — Evaluation (Signed)
Occupational Therapy Evaluation Patient Details Name: [name] MRN: [id number] DOB: 08/30/1946 Today's Date: 07/31/2022   History of Present Illness Pt s/p REVERSE TOTAL SHOULDER ARTHROPLASTY  per Dr Ranell Patrick.   Clinical Impression   OT education complete.  Pts spouse and daughter present for education.        Recommendations for follow up therapy are one component of a multi-disciplinary discharge planning process, led by the attending physician.  Recommendations may be updated based on patient status, additional functional criteria and insurance authorization.   Follow Up Recommendations  Follow physician's recommendations for discharge plan and follow up therapies       Patient can return home with the following A little help with bathing/dressing/bathroom             Precautions / Restrictions Precautions Precautions: Shoulder Type of Shoulder Precautions: no shoulder movement at this time. Elbow, wrist and finger ROM Ok Shoulder Interventions: Shoulder sling/immobilizer Required Braces or Orthoses: Sling Restrictions Weight Bearing Restrictions: Yes Other Position/Activity Restrictions: light WB per Dr Ranell Patrick in chair ok if pt needed to steady herself      Mobility Bed Mobility Overal bed mobility: Needs Assistance Bed Mobility: Supine to Sit     Supine to sit: Min assist          Transfers Overall transfer level: Needs assistance Equipment used: 1 person hand held assist Transfers: Sit to/from Stand, Bed to chair/wheelchair/BSC Sit to Stand: Min assist                          Vision Patient Visual Report: No change from baseline              Pertinent Vitals/Pain Pain Assessment Pain Assessment: No/denies pain           Communication Communication Communication: No difficulties   Cognition Arousal/Alertness: Awake/alert Behavior During Therapy: WFL for tasks assessed/performed Overall Cognitive Status: Within Functional  Limits for tasks assessed                                                  Home Living Family/patient expects to be discharged to:: Private residence Living Arrangements: Spouse/significant other;Children Available Help at Discharge: Family                                    Prior Functioning/Environment Prior Level of Function : Independent/Modified Independent                                 OT Goals(Current goals can be found in the care plan section) Acute Rehab OT Goals Patient Stated Goal: home today OT Goal Formulation: With patient  OT Frequency:         AM-PAC OT "6 Clicks" Daily Activity     Outcome Measure Help from another person eating meals?: None Help from another person taking care of personal grooming?: A Little Help from another person toileting, which includes using toliet, bedpan, or urinal?: A Little Help from another person bathing (including washing, rinsing, drying)?: A Little Help from another person to put on and taking off regular upper body clothing?: A Little Help from another person to put on and  taking off regular lower body clothing?: A Little 6 Click Score: 19   End of Session Nurse Communication: Mobility status  Activity Tolerance: Patient tolerated treatment well Patient left: in chair                   Time: 1132-1200 OT Time Calculation (min): 28 min Charges:  OT General Charges $OT Visit: 1 Visit OT Evaluation $OT Eval Low Complexity: 1 Low  Lise Auer, OT Acute Rehabilitation Services [phone] Office- [phone]     Sadhana Frater, Karin Golden D 07/31/2022, 12:55 PM

## 2022-07-31 NOTE — Progress Notes (Signed)
Pt has DC order. Pt was seen by OT, and was cleared. No need for any DME. AVS was given and explained to pt and family. All questions were answered.

## 2022-07-31 NOTE — Progress Notes (Signed)
Orthopedics Progress Note  Subjective: Patient is comfortable this AM. Block still working  Objective:  Vitals:   07/31/22 0053 07/31/22 0452  BP: 112/65 (!) 116/51  Pulse: 90 87  Resp: 16 14  Temp: (!) 97.5 F (36.4 C) (!) 97.5 F (36.4 C)  SpO2: 93% 93%    General: Awake and alert  Musculoskeletal: Left shoulder dressing CDI Neurovascularly intact  Lab Results  Component Value Date   WBC 7.3 07/21/2022   HGB 12.5 07/31/2022   HCT 38.4 07/31/2022   MCV 87.8 07/21/2022   PLT 171 07/21/2022       Component Value Date/Time   NA 142 07/31/2022 0517   NA 135 07/07/2022 0814   K 4.3 07/31/2022 0517   CL 109 07/31/2022 0517   CO2 27 07/31/2022 0517   GLUCOSE 139 (H) 07/31/2022 0517   BUN 11 07/31/2022 0517   BUN 10 07/07/2022 0814   CREATININE 0.81 07/31/2022 0517   CREATININE 0.83 01/04/2011 0842   CALCIUM 8.9 07/31/2022 0517   CALCIUM 10.5 12/21/2006 2344   GFRNONAA >60 07/31/2022 0517   GFRNONAA >60 01/04/2011 0842   GFRAA 89 04/21/2020 0800   GFRAA >60 01/04/2011 0842    Lab Results  Component Value Date   INR 1.7 (H) 06/27/2008   INR 1.8 (H) 06/26/2008   INR 1.1 06/25/2008    Assessment/Plan: POD #1 s/p Procedure(s): REVERSE TOTAL SHOULDER ARTHROPLASTY   Stable after Reverse TSA for fracture with tuberosity repair.  OT this AM, conservative protocol. Ok to use for gentle balance and on walker, limit WB. Ok hand to face ADLs Follow up in two weeks in the office  Remo Lipps R. Veverly Fells, MD 07/31/2022 9:35 AM
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
## 2022-07-31 NOTE — Op Note (Signed)
NAMELINNA, [name] MEDICAL RECORD NO: [id number] ACCOUNT NO: [id number] DATE OF BIRTH: 07-Feb-1946 FACILITY: [facility] LOCATION: [facility] PHYSICIAN: Doran Heater. Veverly Fells, MD  Operative Report   DATE OF PROCEDURE: 07/30/2022  PREOPERATIVE DIAGNOSIS:  Left comminuted and displaced proximal humerus fracture.  POSTOPERATIVE DIAGNOSIS:  Left comminuted and displaced proximal humerus fracture.  PROCEDURE PERFORMED:  Left reverse total shoulder arthroplasty using DePuy Delta Xtend prosthesis utilizing fracture epi and tuberosity repair.  ATTENDING SURGEON:  Doran Heater. Veverly Fells, MD  ASSISTANT:  Charletta Cousin [place], [place], who was scrubbed during the entire procedure, and necessary for satisfactory completion of surgery.  ANESTHESIA:  General anesthesia was used plus interscalene block.  ESTIMATED BLOOD LOSS:  200 mL  FLUID REPLACEMENT:  1500 mL crystalloid.  COUNTS:  Instrument counts correct.  COMPLICATIONS:  No complications.  ANTIBIOTICS:  Perioperative antibiotics were given.  INDICATIONS:  The patient is a 75 year old female who suffered a fall injuring her left shoulder.  The patient had displacement of her fracture fragments over the course of one week of sling immobilization.  Having displaced fracture indicating  instability, we discussed options with the patient recommending CT scan to further delineate the fracture pattern.  The CT showed a head split component and extensive comminution.  Based on these findings, we recommended reverse shoulder replacement in  order to restore proximal humeral anatomy and stability and improve her function and quality of life and eliminate pain.  Informed consent obtained.  DESCRIPTION OF PROCEDURE:  After an adequate level of general anesthesia was achieved and interscalene block, patient was positioned in the modified beach chair position.  Left shoulder correctly identified and sterilely prepped and draped in the usual  manner.  Timeout called, verifying  correct patient, correct site. We entered the patient's shoulder using a standard deltopectoral approach starting at the coracoid process extending down to the anterior humerus.  Dissection down through subcutaneous  tissues using Bovie.  Cephalic vein was identified and taken laterally with the deltoid pectoralis taken medially.  Conjoined tendon identified and retracted medially.  Deep retractors placed.  We tenodesed the biceps in situ with 0 Vicryl  figure-of-eight suture x2.  We then released the soft tissue over the biceps sheath and I utilized the osteotome and osteotomized the lesser tuberosity with the subscapularis off of the humeral head.  Once we did that, we tagged the lesser tuberosity for  repair with #2 FiberWire suture in a two mattress sutures so the sutures went superficially through the subscap and then gained full-thickness purchase of the subscap and then backed out superficially over the lesser tuberosity with two of those  mattress sutures.  We then went and placed the T-handled Crego elevator over the top of the humeral head and then used that and used an osteotome to osteotomize the greater tuberosity off of the humeral head.  We took #2 FiberWire suture x3 and used  mattress sutures lateral to the greater tuberosity with the suture limbs being out superficial.  We then removed the humeral head using the head for bone graft.  Once we had the tuberosities debulked and ready for repair, we went ahead and went to the  glenoid side.  We first gained good visualization of the glenoid face.  We removed the biceps stump and labrum and released the capsule.  Once we had good visualization of the glenoid and had our deep retractors placed to remove the remaining cartilage  on the glenoid, we found our center point for this very small glenoid,  it was about 25 mm or less and we placed our guide pin.  We reamed for the metaglene baseplate.  We then drilled our central peg hole, impacting the  metaglene into position.  We could  only get superior and inferior screws, but we got a 36 inferior and a 42 superior with great baseplate security.  We then selected a 38 standard glenosphere and attached that to the baseplate.  Next, we came out to the humeral side, we reamed from a 6  mm up to an 8 mm diameter and the humeral shaft.  We placed the trial 8 Porocoat stem with the fracture epi, which was a 1 left metaphysis and we had that set on the 0 setting and we went ahead and placed that in about 20 degrees of retroversion.  With  that fully seated, I was able to reduce with a 38+3 poly and ranged the shoulder.  We felt like this was appropriate tensioning for this fracture.  We removed the trial components.  We irrigated thoroughly, drilled holes in the humeral shaft and placed  two #2 FiberWire sutures through that with suture limbs coming out superficially for tuberosity to shaft repair.  Once we had the sutures placed, we irrigated and dried the canal.  We then vacuum mixed high viscosity cement and cemented the stem into  place.  This was a Porocoat 8 stem with the fracture epi set on the 0 setting and placed in 20 degrees of retroversion.  Once we had that in cemented in place and all the cement was hardened, we removed excess cement.  We went ahead and we placed an  around-the-world stitch through the stem itself and then we trialled with a 38+3 poly on the humerus again on the humeral tray.  We felt like we could go to +6.  We went ahead and selected the real 38+6 poly and placed that on the humeral tray impacted  that.  We then reduced the shoulder, had nice little pop as it reduced and appropriate conjoined tensioning, no gapping with external rotation or pulling. With the shoulder reduced, we irrigated thoroughly.  We then went ahead and repaired the  tuberosities meticulously, first we bone grafted the proximal humeral implant.  We then went ahead and first tied tuberosities to  tuberosities.  We did a couple of rotator interval sutures.  We passed our around-the-world stitch medial to the lesser  tuberosity and lateral to the greater tuberosity and then we did our shaft to tuberosity sutures. With all those tied down, everything moved together as a unit.  We had really was an anatomic repair of the tuberosities and they were quite stable  throughout a full range of motion.  With that in place, we went ahead and irrigated thoroughly.  We then closed the deltopectoral interval with 0 Vicryl suture followed by 2-0 Vicryl for subcutaneous closure and staples for skin.  Sterile dressing  applied followed by a shoulder sling.  The patient transported to recovery room in stable condition.     SUJ D: 07/30/2022 6:18:09 pm T: 07/31/2022 12:43:00 am  JOB: [id number]/ [id number]

## 2022-08-02 ENCOUNTER — Encounter (HOSPITAL_COMMUNITY): Payer: Self-pay | Admitting: Orthopedic Surgery

## 2022-08-06 ENCOUNTER — Telehealth: Payer: Self-pay | Admitting: Family Medicine

## 2022-08-06 MED ORDER — NYSTATIN 100000 UNIT/ML MT SUSP: 5.0000 mL | Freq: Three times a day (TID) | OROMUCOSAL | 0 refills | Status: DC | PRN

## 2022-08-06 NOTE — Telephone Encounter (Signed)
Left message making pt aware. 

## 2022-08-06 NOTE — Telephone Encounter (Signed)
Yes that is okay to substitute maximum strength chary, I do not know exactly what they put in it but that would be fine for the patient

## 2022-08-06 NOTE — Telephone Encounter (Signed)
Pharmacist made aware.

## 2022-08-06 NOTE — Telephone Encounter (Signed)
I sent Magic mouthwash for the patient

## 2022-08-12 DIAGNOSIS — Z4789 Encounter for other orthopedic aftercare: Secondary | ICD-10-CM | POA: Diagnosis not present

## 2022-08-12 DIAGNOSIS — M25512 Pain in left shoulder: Secondary | ICD-10-CM | POA: Diagnosis not present

## 2022-08-17 DIAGNOSIS — S62524A Nondisplaced fracture of distal phalanx of right thumb, initial encounter for closed fracture: Secondary | ICD-10-CM | POA: Diagnosis not present

## 2022-08-18 ENCOUNTER — Ambulatory Visit (INDEPENDENT_AMBULATORY_CARE_PROVIDER_SITE_OTHER): Payer: PPO | Admitting: Family Medicine

## 2022-08-18 ENCOUNTER — Encounter: Payer: Self-pay | Admitting: Family Medicine

## 2022-08-18 VITALS — BP 120/71 | HR 69 | Temp 97.9°F | Ht 59.0 in | Wt 140.0 lb

## 2022-08-18 DIAGNOSIS — R2689 Other abnormalities of gait and mobility: Secondary | ICD-10-CM | POA: Diagnosis not present

## 2022-08-18 DIAGNOSIS — R29898 Other symptoms and signs involving the musculoskeletal system: Secondary | ICD-10-CM

## 2022-08-18 DIAGNOSIS — R296 Repeated falls: Secondary | ICD-10-CM

## 2022-08-18 NOTE — Progress Notes (Signed)
BP 120/71   Pulse 69   Temp 97.9 F (36.6 C)   Ht '4\' 11"'$  (1.499 m)   Wt 140 lb (63.5 kg)   SpO2 93%   BMI 28.28 kg/m    Subjective:   Patient ID: [name], female    DOB: Jun 15, 1946, 76 y.o.   MRN: [id number]  HPI: [name] is a 76 y.o. female presenting on 08/18/2022 for Annual Exam   HPI Recurrent falls Patient is coming in today because she has had recurrent falls and has fallen 2 times over the past month and one of them resulted in her having a reverse total shoulder because it is a fracture.  She also fractured the distal phalanx of her thumb.  She has been more off balance.  She says both times that she felt she stepped or hit her foot or was on uneven ground and then could not catch herself and then fell forward or backwards.  The first time she fell forward and landed on the left shoulder, the second time she fell backwards and landed on her right scapula but the scapula is doing better.  She is recovering from the surgery on her left shoulder as well.  Relevant past medical, surgical, family and social history reviewed and updated as indicated. Interim medical history since our last visit reviewed. Allergies and medications reviewed and updated.  Review of Systems  Constitutional:  Negative for chills and fever.  Eyes:  Negative for visual disturbance.  Respiratory:  Negative for chest tightness and shortness of breath.   Cardiovascular:  Negative for chest pain and leg swelling.  Musculoskeletal:  Negative for back pain and gait problem.  Skin:  Negative for rash.  Neurological:  Negative for light-headedness and headaches.  Psychiatric/Behavioral:  Negative for agitation and behavioral problems.   All other systems reviewed and are negative.   Per HPI unless specifically indicated above   Allergies as of 08/18/2022       Reactions   Nsaids Hives   Poison Ivy Extract Rash   Mango Flavor [flavoring Agent] Hives   Neomycin-polymyxin B Gu Other (See  Comments)   unknown   Teriparatide Hives        Medication List        Accurate as of August 18, 2022  2:19 PM. If you have any questions, ask your nurse or doctor.          Calcium 1000 + D 1000-20 MG-MCG Tabs Generic drug: Calcium Carb-Cholecalciferol Take 600 mg by mouth daily.   cholecalciferol 25 MCG (1000 UNIT) tablet Commonly known as: VITAMIN D3 Take 1,000 Units by mouth daily.   Coenzyme Q10 200 MG capsule Take 200 mg by mouth daily.   famotidine 20 MG tablet Commonly known as: PEPCID Take 1 tablet (20 mg total) by mouth 2 (two) times daily.   Fish Oil 1000 MG Caps Take 1,200 mg by mouth in the morning and at bedtime.   hyoscyamine 0.125 MG SL tablet Commonly known as: LEVSIN SL DISSOLVE 1 TABLET IN MOUTH EVERY 8 HOURS AS NEEDED What changed: See the new instructions.   hyoscyamine 0.375 MG 12 hr tablet Commonly known as: LEVBID Take 1 tablet by mouth twice daily What changed: Another medication with the same name was changed. Make sure you understand how and when to take each.   magic mouthwash (nystatin, diphenhydrAMINE, alum & mag hydroxide) suspension mixture Swish and swallow 5 mLs 3 (three) times daily as needed for mouth  pain. Put in equal parts nystatin and Benadryl and Maalox   Magnesium 500 MG Caps Take 500 mg by mouth daily.   multivitamin capsule Take 1 capsule by mouth daily.   NASACORT ALLERGY 24HR NA Place 1 spray into the nose daily.   pravastatin 20 MG tablet Commonly known as: PRAVACHOL Take 1 tablet (20 mg total) by mouth at bedtime.   traMADol 50 MG tablet Commonly known as: Ultram Take 1 tablet (50 mg total) by mouth every 6 (six) hours as needed for moderate pain or severe pain.   Zinc 50 MG Caps Take 50 mg by mouth daily.         Objective:   BP 120/71   Pulse 69   Temp 97.9 F (36.6 C)   Ht '4\' 11"'$  (1.499 m)   Wt 140 lb (63.5 kg)   SpO2 93%   BMI 28.28 kg/m   Wt Readings from Last 3 Encounters:   08/18/22 140 lb (63.5 kg)  07/30/22 143 lb (64.9 kg)  07/21/22 147 lb 11.3 oz (67 kg)    Physical Exam Vitals and nursing note reviewed.  Constitutional:      General: She is not in acute distress.    Appearance: She is well-developed. She is not diaphoretic.  Eyes:     Conjunctiva/sclera: Conjunctivae normal.  Cardiovascular:     Rate and Rhythm: Normal rate and regular rhythm.     Heart sounds: Normal heart sounds. No murmur heard. Pulmonary:     Effort: Pulmonary effort is normal. No respiratory distress.     Breath sounds: Normal breath sounds. No wheezing.  Musculoskeletal:        General: No swelling or tenderness. Normal range of motion.  Skin:    General: Skin is warm and dry.     Findings: No rash.  Neurological:     Mental Status: She is alert and oriented to person, place, and time.     Coordination: Coordination normal.  Psychiatric:        Behavior: Behavior normal.       Assessment & Plan:   Problem List Items Addressed This Visit   None Visit Diagnoses     Recurrent falls    -  Primary   Relevant Orders   Ambulatory referral to Physical Therapy   Balance disorder       Relevant Orders   Ambulatory referral to Physical Therapy   Muscular deconditioning       Relevant Orders   Ambulatory referral to Physical Therapy     Patient exhibits what is likely core weakness and core strength issues.  She is off balance and is falling easily when she gets tripped or stubbed her toe.  We will do referral to physical therapy, also give her a list of strengthening exercises she can do at home.  Follow up plan: Return if symptoms worsen or fail to improve.  Counseling provided for all of the vaccine components Orders Placed This Encounter  Procedures   Ambulatory referral to Physical Therapy    Caryl Pina, MD [facility] 08/18/2022, 2:19 PM

## 2022-08-19 ENCOUNTER — Telehealth: Payer: Self-pay | Admitting: Pharmacist

## 2022-08-19 DIAGNOSIS — M8000XS Age-related osteoporosis with current pathological fracture, unspecified site, sequela: Secondary | ICD-10-CM

## 2022-08-19 MED ORDER — DENOSUMAB 60 MG/ML ~~LOC~~ SOSY: 60.0000 mg | PREFILLED_SYRINGE | SUBCUTANEOUS | 0 refills | Status: DC

## 2022-08-19 NOTE — Telephone Encounter (Signed)
PA FOR PROLIA NEEDED RX "print only" --haven't sent to pharmacy Patient is aware that process may take a few weeks Please make patient aware of cost, etc when available   Thank you!

## 2022-08-27 ENCOUNTER — Other Ambulatory Visit: Payer: Self-pay

## 2022-08-27 ENCOUNTER — Ambulatory Visit: Payer: PPO | Attending: Family Medicine

## 2022-08-27 DIAGNOSIS — R2689 Other abnormalities of gait and mobility: Secondary | ICD-10-CM | POA: Diagnosis not present

## 2022-08-27 DIAGNOSIS — R296 Repeated falls: Secondary | ICD-10-CM | POA: Insufficient documentation

## 2022-08-27 DIAGNOSIS — Z9181 History of falling: Secondary | ICD-10-CM

## 2022-08-27 DIAGNOSIS — M6281 Muscle weakness (generalized): Secondary | ICD-10-CM | POA: Diagnosis not present

## 2022-08-27 DIAGNOSIS — R29898 Other symptoms and signs involving the musculoskeletal system: Secondary | ICD-10-CM | POA: Insufficient documentation
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
## 2022-08-30 NOTE — Telephone Encounter (Signed)
Working on [place].  Will notify patient once it is approved

## 2022-08-31 ENCOUNTER — Ambulatory Visit: Payer: PPO | Admitting: Physical Therapy

## 2022-09-01 ENCOUNTER — Encounter: Payer: Self-pay | Admitting: Physical Therapy

## 2022-09-01 ENCOUNTER — Ambulatory Visit: Payer: PPO | Admitting: Physical Therapy

## 2022-09-01 DIAGNOSIS — Z9181 History of falling: Secondary | ICD-10-CM | POA: Diagnosis not present

## 2022-09-01 DIAGNOSIS — M6281 Muscle weakness (generalized): Secondary | ICD-10-CM
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
## 2022-09-06 ENCOUNTER — Ambulatory Visit: Payer: PPO

## 2022-09-07 ENCOUNTER — Ambulatory Visit: Payer: PPO | Admitting: *Deleted

## 2022-09-07 DIAGNOSIS — M6281 Muscle weakness (generalized): Secondary | ICD-10-CM

## 2022-09-07 DIAGNOSIS — Z9181 History of falling: Secondary | ICD-10-CM
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
## 2022-09-08 ENCOUNTER — Encounter: Payer: Self-pay | Admitting: Family Medicine

## 2022-09-09 DIAGNOSIS — Z4789 Encounter for other orthopedic aftercare: Secondary | ICD-10-CM | POA: Diagnosis not present

## 2022-09-10 ENCOUNTER — Encounter: Payer: Self-pay | Admitting: Physical Therapy

## 2022-09-10 ENCOUNTER — Ambulatory Visit: Payer: PPO | Attending: Family Medicine | Admitting: Physical Therapy

## 2022-09-10 DIAGNOSIS — M6281 Muscle weakness (generalized): Secondary | ICD-10-CM | POA: Diagnosis not present

## 2022-09-10 DIAGNOSIS — Z9181 History of falling: Secondary | ICD-10-CM
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
## 2022-09-13 ENCOUNTER — Other Ambulatory Visit: Payer: Self-pay

## 2022-09-13 ENCOUNTER — Ambulatory Visit: Payer: PPO | Attending: Orthopedic Surgery

## 2022-09-13 DIAGNOSIS — M25612 Stiffness of left shoulder, not elsewhere classified: Secondary | ICD-10-CM | POA: Diagnosis not present

## 2022-09-13 DIAGNOSIS — M25512 Pain in left shoulder: Secondary | ICD-10-CM | POA: Diagnosis not present
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
## 2022-09-14 MED ORDER — DENOSUMAB 60 MG/ML ~~LOC~~ SOSY: 60.0000 mg | PREFILLED_SYRINGE | SUBCUTANEOUS | 2 refills | Status: DC

## 2022-09-14 NOTE — Telephone Encounter (Signed)
Patient is new to [facility].  She will owe $315.  [place]

## 2022-09-14 NOTE — Telephone Encounter (Signed)
Called patient  Will call in to [facility] to see if cheaper at the pharmacy Patient to notify us of cost difference Buy/build is $315 for 6 month supply

## 2022-09-15 ENCOUNTER — Encounter: Payer: Self-pay | Admitting: Physical Therapy

## 2022-09-15 ENCOUNTER — Telehealth: Payer: Self-pay | Admitting: Family Medicine

## 2022-09-15 ENCOUNTER — Ambulatory Visit: Payer: PPO | Admitting: Physical Therapy

## 2022-09-15 DIAGNOSIS — M25612 Stiffness of left shoulder, not elsewhere classified: Secondary | ICD-10-CM

## 2022-09-15 DIAGNOSIS — M8000XS Age-related osteoporosis with current pathological fracture, unspecified site, sequela: Secondary | ICD-10-CM

## 2022-09-15 DIAGNOSIS — M25512 Pain in left shoulder: Secondary | ICD-10-CM

## 2022-09-15 MED ORDER — DENOSUMAB 60 MG/ML ~~LOC~~ SOSY: 60.0000 mg | PREFILLED_SYRINGE | SUBCUTANEOUS | 2 refills | Status: DC
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
## 2022-09-15 NOTE — Telephone Encounter (Signed)
Per Julie's note Prolia should be sent to [facility]. It was instead sent to [facility]. Left message Informing pt that we will try and send to [facility] to see if any cheaper.

## 2022-09-15 NOTE — Telephone Encounter (Signed)
Prescription for Prolia

## 2022-09-15 NOTE — Telephone Encounter (Signed)
Patient said that they prescription was too expensive and wanted to know if it could be sent to a different pharmacy. Please call back and advise.

## 2022-09-20 ENCOUNTER — Ambulatory Visit: Payer: PPO

## 2022-09-20 DIAGNOSIS — M25512 Pain in left shoulder: Secondary | ICD-10-CM

## 2022-09-20 DIAGNOSIS — M25612 Stiffness of left shoulder, not elsewhere classified: Secondary | ICD-10-CM | POA: Diagnosis not present
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
## 2022-09-22 ENCOUNTER — Ambulatory Visit: Payer: PPO

## 2022-09-22 DIAGNOSIS — M25612 Stiffness of left shoulder, not elsewhere classified: Secondary | ICD-10-CM | POA: Diagnosis not present

## 2022-09-22 DIAGNOSIS — M25512 Pain in left shoulder: Secondary | ICD-10-CM
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
## 2022-09-24 ENCOUNTER — Ambulatory Visit (INDEPENDENT_AMBULATORY_CARE_PROVIDER_SITE_OTHER): Payer: PPO

## 2022-09-24 DIAGNOSIS — M810 Age-related osteoporosis without current pathological fracture: Secondary | ICD-10-CM

## 2022-09-24 DIAGNOSIS — M8000XS Age-related osteoporosis with current pathological fracture, unspecified site, sequela: Secondary | ICD-10-CM

## 2022-09-24 MED ORDER — DENOSUMAB 60 MG/ML ~~LOC~~ SOSY
60.0000 mg | PREFILLED_SYRINGE | Freq: Once | SUBCUTANEOUS | Status: AC
  Administered 2022-09-24: 60 mg via SUBCUTANEOUS

## 2022-09-24 NOTE — Progress Notes (Signed)
Prolia injection given to right arm.  Patient tolerated well.  Medication is patient supplied.

## 2022-09-27 ENCOUNTER — Ambulatory Visit: Payer: PPO

## 2022-09-27 DIAGNOSIS — M25612 Stiffness of left shoulder, not elsewhere classified: Secondary | ICD-10-CM | POA: Diagnosis not present

## 2022-09-27 DIAGNOSIS — M25512 Pain in left shoulder: Secondary | ICD-10-CM
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
## 2022-09-29 ENCOUNTER — Ambulatory Visit: Payer: PPO

## 2022-09-29 DIAGNOSIS — M25612 Stiffness of left shoulder, not elsewhere classified: Secondary | ICD-10-CM

## 2022-09-29 DIAGNOSIS — M25512 Pain in left shoulder: Secondary | ICD-10-CM
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
## 2022-10-06 ENCOUNTER — Ambulatory Visit: Payer: PPO

## 2022-10-06 DIAGNOSIS — M25612 Stiffness of left shoulder, not elsewhere classified: Secondary | ICD-10-CM

## 2022-10-06 DIAGNOSIS — M25512 Pain in left shoulder: Secondary | ICD-10-CM
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
## 2022-10-07 NOTE — Telephone Encounter (Signed)
Injection given 09/24/22 - patient supplied

## 2022-10-08 ENCOUNTER — Ambulatory Visit: Payer: PPO

## 2022-10-08 DIAGNOSIS — M25512 Pain in left shoulder: Secondary | ICD-10-CM

## 2022-10-08 DIAGNOSIS — M25612 Stiffness of left shoulder, not elsewhere classified: Secondary | ICD-10-CM
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
## 2022-10-12 ENCOUNTER — Ambulatory Visit: Payer: PPO | Attending: Orthopedic Surgery | Admitting: Physical Therapy

## 2022-10-12 DIAGNOSIS — M6281 Muscle weakness (generalized): Secondary | ICD-10-CM | POA: Diagnosis not present

## 2022-10-12 DIAGNOSIS — M25612 Stiffness of left shoulder, not elsewhere classified: Secondary | ICD-10-CM | POA: Insufficient documentation

## 2022-10-12 DIAGNOSIS — M25512 Pain in left shoulder: Secondary | ICD-10-CM | POA: Diagnosis not present
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
## 2022-10-14 ENCOUNTER — Ambulatory Visit: Payer: PPO | Admitting: *Deleted

## 2022-10-14 ENCOUNTER — Encounter: Payer: Self-pay | Admitting: *Deleted

## 2022-10-14 DIAGNOSIS — M25612 Stiffness of left shoulder, not elsewhere classified: Secondary | ICD-10-CM | POA: Diagnosis not present

## 2022-10-14 DIAGNOSIS — M6281 Muscle weakness (generalized): Secondary | ICD-10-CM

## 2022-10-14 DIAGNOSIS — M25512 Pain in left shoulder: Secondary | ICD-10-CM
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
## 2022-10-18 ENCOUNTER — Encounter: Payer: Self-pay | Admitting: Physical Therapy

## 2022-10-18 ENCOUNTER — Ambulatory Visit: Payer: PPO | Admitting: Physical Therapy

## 2022-10-18 DIAGNOSIS — M25512 Pain in left shoulder: Secondary | ICD-10-CM

## 2022-10-18 DIAGNOSIS — M25612 Stiffness of left shoulder, not elsewhere classified: Secondary | ICD-10-CM
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
## 2022-10-20 ENCOUNTER — Ambulatory Visit: Payer: PPO | Admitting: Physical Therapy

## 2022-10-20 DIAGNOSIS — M25612 Stiffness of left shoulder, not elsewhere classified: Secondary | ICD-10-CM | POA: Diagnosis not present

## 2022-10-20 DIAGNOSIS — M6281 Muscle weakness (generalized): Secondary | ICD-10-CM

## 2022-10-20 DIAGNOSIS — M25512 Pain in left shoulder: Secondary | ICD-10-CM
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
## 2022-10-27 ENCOUNTER — Other Ambulatory Visit: Payer: Self-pay | Admitting: *Deleted

## 2022-10-27 MED ORDER — PRAVASTATIN SODIUM 20 MG PO TABS: 20.0000 mg | ORAL_TABLET | Freq: Every day | ORAL | 0 refills | Status: DC

## 2022-11-17 ENCOUNTER — Telehealth: Payer: Self-pay | Admitting: Family Medicine

## 2022-11-17 DIAGNOSIS — M19012 Primary osteoarthritis, left shoulder: Secondary | ICD-10-CM

## 2022-11-17 DIAGNOSIS — M19011 Primary osteoarthritis, right shoulder: Secondary | ICD-10-CM

## 2022-11-17 DIAGNOSIS — R7303 Prediabetes: Secondary | ICD-10-CM

## 2022-11-17 DIAGNOSIS — I10 Essential (primary) hypertension: Secondary | ICD-10-CM

## 2022-11-17 DIAGNOSIS — E782 Mixed hyperlipidemia: Secondary | ICD-10-CM

## 2022-11-17 NOTE — Telephone Encounter (Signed)
Future orders placed. Pt made aware. She would like for her thyroid to be checked and an A1C along with routine labs. Labs ordered.

## 2022-11-17 NOTE — Telephone Encounter (Signed)
Patient would like to come on 2/8 for labs so that she can discuss with PCP on 2/9 at her appt. Please call back.

## 2022-11-18 ENCOUNTER — Other Ambulatory Visit: Payer: PPO

## 2022-11-18 DIAGNOSIS — M19011 Primary osteoarthritis, right shoulder: Secondary | ICD-10-CM

## 2022-11-18 DIAGNOSIS — I10 Essential (primary) hypertension: Secondary | ICD-10-CM

## 2022-11-18 DIAGNOSIS — E782 Mixed hyperlipidemia: Secondary | ICD-10-CM

## 2022-11-18 DIAGNOSIS — M19012 Primary osteoarthritis, left shoulder: Secondary | ICD-10-CM | POA: Diagnosis not present

## 2022-11-18 DIAGNOSIS — R7303 Prediabetes: Secondary | ICD-10-CM

## 2022-11-18 LAB — BAYER DCA HB A1C WAIVED: HB A1C (BAYER DCA - WAIVED): 5.8 % — ABNORMAL HIGH (ref 4.8–5.6)

## 2022-11-19 ENCOUNTER — Ambulatory Visit (INDEPENDENT_AMBULATORY_CARE_PROVIDER_SITE_OTHER): Payer: PPO | Admitting: Family Medicine

## 2022-11-19 ENCOUNTER — Encounter: Payer: Self-pay | Admitting: Family Medicine

## 2022-11-19 VITALS — BP 109/70 | HR 67 | Ht 59.0 in | Wt 142.0 lb

## 2022-11-19 DIAGNOSIS — E782 Mixed hyperlipidemia: Secondary | ICD-10-CM | POA: Diagnosis not present

## 2022-11-19 DIAGNOSIS — K219 Gastro-esophageal reflux disease without esophagitis: Secondary | ICD-10-CM | POA: Diagnosis not present

## 2022-11-19 DIAGNOSIS — I10 Essential (primary) hypertension: Secondary | ICD-10-CM | POA: Diagnosis not present

## 2022-11-19 LAB — CMP14+EGFR
ALT: 13 IU/L (ref 0–32)
AST: 15 IU/L (ref 0–40)
Albumin/Globulin Ratio: 2.2 (ref 1.2–2.2)
Albumin: 4.2 g/dL (ref 3.8–4.8)
Alkaline Phosphatase: 49 IU/L (ref 44–121)
BUN/Creatinine Ratio: 19 (ref 12–28)
BUN: 14 mg/dL (ref 8–27)
Bilirubin Total: 0.4 mg/dL (ref 0.0–1.2)
CO2: 23 mmol/L (ref 20–29)
Calcium: 9.3 mg/dL (ref 8.7–10.3)
Chloride: 102 mmol/L (ref 96–106)
Creatinine, Ser: 0.75 mg/dL (ref 0.57–1.00)
Globulin, Total: 1.9 g/dL (ref 1.5–4.5)
Glucose: 88 mg/dL (ref 70–99)
Potassium: 4.7 mmol/L (ref 3.5–5.2)
Sodium: 138 mmol/L (ref 134–144)
Total Protein: 6.1 g/dL (ref 6.0–8.5)
eGFR: 83 mL/min/{1.73_m2} (ref >59)

## 2022-11-19 LAB — CBC WITH DIFFERENTIAL/PLATELET
Basophils Absolute: 0.1 10*3/uL (ref 0.0–0.2)
Basos: 1 %
EOS (ABSOLUTE): 0.1 10*3/uL (ref 0.0–0.4)
Eos: 3 %
Hematocrit: 42.8 % (ref 34.0–46.6)
Hemoglobin: 14.3 g/dL (ref 11.1–15.9)
Immature Grans (Abs): 0 10*3/uL (ref 0.0–0.1)
Immature Granulocytes: 0 %
Lymphocytes Absolute: 1.9 10*3/uL (ref 0.7–3.1)
Lymphs: 35 %
MCH: 28.3 pg (ref 26.6–33.0)
MCHC: 33.4 g/dL (ref 31.5–35.7)
MCV: 85 fL (ref 79–97)
Monocytes Absolute: 0.5 10*3/uL (ref 0.1–0.9)
Monocytes: 9 %
Neutrophils Absolute: 2.9 10*3/uL (ref 1.4–7.0)
Neutrophils: 52 %
Platelets: 206 10*3/uL (ref 150–450)
RBC: 5.05 x10E6/uL (ref 3.77–5.28)
RDW: 12.8 % (ref 11.7–15.4)
WBC: 5.6 10*3/uL (ref 3.4–10.8)

## 2022-11-19 LAB — LIPID PANEL
Chol/HDL Ratio: 2.8 ratio (ref 0.0–4.4)
Cholesterol, Total: 195 mg/dL (ref 100–199)
HDL: 69 mg/dL (ref >39)
LDL Chol Calc (NIH): 108 mg/dL — ABNORMAL HIGH (ref 0–99)
Triglycerides: 104 mg/dL (ref 0–149)
VLDL Cholesterol Cal: 18 mg/dL (ref 5–40)

## 2022-11-19 LAB — TSH: TSH: 3.4 u[IU]/mL (ref 0.450–4.500)

## 2022-11-19 LAB — VITAMIN D 25 HYDROXY (VIT D DEFICIENCY, FRACTURES): Vit D, 25-Hydroxy: 90.5 ng/mL (ref 30.0–100.0)

## 2022-11-19 MED ORDER — PRAVASTATIN SODIUM 20 MG PO TABS: 20.0000 mg | ORAL_TABLET | Freq: Every day | ORAL | 3 refills | Status: DC

## 2022-11-19 NOTE — Progress Notes (Signed)
BP 109/70   Pulse 67   Ht 4' 11"$  (1.499 m)   Wt 142 lb (64.4 kg)   SpO2 95%   BMI 28.68 kg/m    Subjective:   Patient ID: [name], female    DOB: March 18, 1946, 77 y.o.   MRN: [id number]  HPI: [name] is a 77 y.o. female presenting on 11/19/2022 for Medical Management of Chronic Issues, Hyperlipidemia, and Hypertension   HPI Hypertension Patient is currently on no medicine currently, diet control, and their blood pressure today is 1 9/70. Patient denies any lightheadedness or dizziness. Patient denies headaches, blurred vision, chest pains, shortness of breath, or weakness. Denies any side effects from medication and is content with current medication.   Hyperlipidemia Patient is coming in for recheck of his hyperlipidemia. The patient is currently taking pravastatin and fish oil. They deny any issues with myalgias or history of liver damage from it. They deny any focal numbness or weakness or chest pain.   GERD Patient is currently on famotidine.  She denies any major symptoms or abdominal pain or belching or burping. She denies any blood in her stool or lightheadedness or dizziness.   Relevant past medical, surgical, family and social history reviewed and updated as indicated. Interim medical history since our last visit reviewed. Allergies and medications reviewed and updated.  Review of Systems  Constitutional:  Negative for chills and fever.  HENT:  Negative for congestion, ear discharge and ear pain.   Eyes:  Negative for redness and visual disturbance.  Respiratory:  Negative for chest tightness and shortness of breath.   Cardiovascular:  Negative for chest pain and leg swelling.  Genitourinary:  Negative for difficulty urinating and dysuria.  Musculoskeletal:  Negative for back pain and gait problem.  Skin:  Negative for rash.  Neurological:  Negative for dizziness, light-headedness and headaches.  Psychiatric/Behavioral:  Negative for agitation and  behavioral problems.   All other systems reviewed and are negative.   Per HPI unless specifically indicated above   Allergies as of 11/19/2022       Reactions   Nsaids Hives   Poison Ivy Extract Rash   Mango Flavor [flavoring Agent] Hives   Neomycin-polymyxin B Gu Other (See Comments)   unknown   Teriparatide Hives        Medication List        Accurate as of November 19, 2022 11:19 AM. If you have any questions, ask your nurse or doctor.          Calcium 1000 + D 1000-20 MG-MCG Tabs Generic drug: Calcium Carb-Cholecalciferol Take 600 mg by mouth daily.   cholecalciferol 25 MCG (1000 UNIT) tablet Commonly known as: VITAMIN D3 Take 1,000 Units by mouth daily.   Coenzyme Q10 200 MG capsule Take 200 mg by mouth daily.   denosumab 60 MG/ML Sosy injection Commonly known as: PROLIA Inject 60 mg into the skin every 6 (six) months.   famotidine 20 MG tablet Commonly known as: PEPCID Take 1 tablet (20 mg total) by mouth 2 (two) times daily.   Fish Oil 1000 MG Caps Take 1,200 mg by mouth in the morning and at bedtime.   hyoscyamine 0.125 MG SL tablet Commonly known as: LEVSIN SL DISSOLVE 1 TABLET IN MOUTH EVERY 8 HOURS AS NEEDED What changed: See the new instructions.   hyoscyamine 0.375 MG 12 hr tablet Commonly known as: LEVBID Take 1 tablet by mouth twice daily What changed: Another medication with the same name  was changed. Make sure you understand how and when to take each.   magic mouthwash (nystatin, diphenhydrAMINE, alum & mag hydroxide) suspension mixture Swish and swallow 5 mLs 3 (three) times daily as needed for mouth pain. Put in equal parts nystatin and Benadryl and Maalox   Magnesium 500 MG Caps Take 500 mg by mouth daily.   multivitamin capsule Take 1 capsule by mouth daily.   NASACORT ALLERGY 24HR NA Place 1 spray into the nose daily.   pravastatin 20 MG tablet Commonly known as: PRAVACHOL Take 1 tablet (20 mg total) by mouth at bedtime.    traMADol 50 MG tablet Commonly known as: Ultram Take 1 tablet (50 mg total) by mouth every 6 (six) hours as needed for moderate pain or severe pain.   Zinc 50 MG Caps Take 50 mg by mouth daily.         Objective:   BP 109/70   Pulse 67   Ht 4' 11"$  (1.499 m)   Wt 142 lb (64.4 kg)   SpO2 95%   BMI 28.68 kg/m   Wt Readings from Last 3 Encounters:  11/19/22 142 lb (64.4 kg)  08/18/22 140 lb (63.5 kg)  07/30/22 143 lb (64.9 kg)    Physical Exam Vitals and nursing note reviewed.  Constitutional:      General: She is not in acute distress.    Appearance: She is well-developed. She is not diaphoretic.  Eyes:     Conjunctiva/sclera: Conjunctivae normal.  Cardiovascular:     Rate and Rhythm: Normal rate and regular rhythm.     Heart sounds: Normal heart sounds. No murmur heard. Pulmonary:     Effort: Pulmonary effort is normal. No respiratory distress.     Breath sounds: Normal breath sounds. No wheezing.  Musculoskeletal:        General: No swelling or tenderness. Normal range of motion.  Skin:    General: Skin is warm and dry.     Findings: No rash.  Neurological:     Mental Status: She is alert and oriented to person, place, and time.     Coordination: Coordination normal.  Psychiatric:        Behavior: Behavior normal.     Results for orders placed or performed in visit on 11/18/22  Vitamin D, 25-hydroxy  Result Value Ref Range   Vit D, 25-Hydroxy 90.5 30.0 - 100.0 ng/mL  Lipid panel  Result Value Ref Range   Cholesterol, Total 195 100 - 199 mg/dL   Triglycerides 104 0 - 149 mg/dL   HDL 69 >39 mg/dL   VLDL Cholesterol Cal 18 5 - 40 mg/dL   LDL Chol Calc (NIH) 108 (H) 0 - 99 mg/dL   Chol/HDL Ratio 2.8 0.0 - 4.4 ratio  CMP14+EGFR  Result Value Ref Range   Glucose 88 70 - 99 mg/dL   BUN 14 8 - 27 mg/dL   Creatinine, Ser 0.75 0.57 - 1.00 mg/dL   eGFR 83 >59 mL/min/1.73   BUN/Creatinine Ratio 19 12 - 28   Sodium 138 134 - 144 mmol/L   Potassium 4.7 3.5 -  5.2 mmol/L   Chloride 102 96 - 106 mmol/L   CO2 23 20 - 29 mmol/L   Calcium 9.3 8.7 - 10.3 mg/dL   Total Protein 6.1 6.0 - 8.5 g/dL   Albumin 4.2 3.8 - 4.8 g/dL   Globulin, Total 1.9 1.5 - 4.5 g/dL   Albumin/Globulin Ratio 2.2 1.2 - 2.2   Bilirubin Total 0.4 0.0 -  1.2 mg/dL   Alkaline Phosphatase 49 44 - 121 IU/L   AST 15 0 - 40 IU/L   ALT 13 0 - 32 IU/L  CBC with Differential/Platelet  Result Value Ref Range   WBC 5.6 3.4 - 10.8 x10E3/uL   RBC 5.05 3.77 - 5.28 x10E6/uL   Hemoglobin 14.3 11.1 - 15.9 g/dL   Hematocrit 42.8 34.0 - 46.6 %   MCV 85 79 - 97 fL   MCH 28.3 26.6 - 33.0 pg   MCHC 33.4 31.5 - 35.7 g/dL   RDW 12.8 11.7 - 15.4 %   Platelets 206 150 - 450 x10E3/uL   Neutrophils 52 Not Estab. %   Lymphs 35 Not Estab. %   Monocytes 9 Not Estab. %   Eos 3 Not Estab. %   Basos 1 Not Estab. %   Neutrophils Absolute 2.9 1.4 - 7.0 x10E3/uL   Lymphocytes Absolute 1.9 0.7 - 3.1 x10E3/uL   Monocytes Absolute 0.5 0.1 - 0.9 x10E3/uL   EOS (ABSOLUTE) 0.1 0.0 - 0.4 x10E3/uL   Basophils Absolute 0.1 0.0 - 0.2 x10E3/uL   Immature Granulocytes 0 Not Estab. %   Immature Grans (Abs) 0.0 0.0 - 0.1 x10E3/uL  TSH  Result Value Ref Range   TSH 3.400 0.450 - 4.500 uIU/mL  Bayer DCA Hb A1c Waived  Result Value Ref Range   HB A1C (BAYER DCA - WAIVED) 5.8 (H) 4.8 - 5.6 %    Assessment & Plan:   Problem List Items Addressed This Visit       Cardiovascular and Mediastinum   Hypertension   Relevant Medications   pravastatin (PRAVACHOL) 20 MG tablet   Other Relevant Orders   CBC with Differential/Platelet   CMP14+EGFR   Lipid panel     Digestive   GERD (gastroesophageal reflux disease)   Relevant Orders   CBC with Differential/Platelet   CMP14+EGFR   Lipid panel     Other   HLD (hyperlipidemia) - Primary   Relevant Medications   pravastatin (PRAVACHOL) 20 MG tablet   Other Relevant Orders   CBC with Differential/Platelet   CMP14+EGFR   Lipid panel    Patient blood  pressure all of her blood work looks a lot better.  I think she is doing very well.  Continue with diet and exercise. Follow up plan: Return if symptoms worsen or fail to improve, for 3 to 37-monthfollow-up.  Counseling provided for all of the vaccine components Orders Placed This Encounter  Procedures   CBC with Differential/Platelet   CMP14+EGFR   Lipid panel    JCaryl Pina MD [facility] 11/19/2022, 11:19 AM

## 2022-11-26 DIAGNOSIS — Z1231 Encounter for screening mammogram for malignant neoplasm of breast: Secondary | ICD-10-CM | POA: Diagnosis not present

## 2022-11-30 ENCOUNTER — Encounter: Payer: Self-pay | Admitting: Family Medicine

## 2023-01-04 ENCOUNTER — Encounter: Payer: Self-pay | Admitting: Family Medicine

## 2023-01-04 ENCOUNTER — Telehealth (INDEPENDENT_AMBULATORY_CARE_PROVIDER_SITE_OTHER): Payer: PPO | Admitting: Family Medicine

## 2023-01-04 DIAGNOSIS — J069 Acute upper respiratory infection, unspecified: Secondary | ICD-10-CM

## 2023-01-04 NOTE — Progress Notes (Signed)
Virtual Visit via Video   I connected with patient on 01/04/23 at 1630 by a video enabled telemedicine application and verified that I am speaking with the correct person using two identifiers.  Location patient: Home Location provider: [facility] participating in the virtual visit: Patient and Provider  I discussed the limitations of evaluation and management by telemedicine and the availability of in person appointments. The patient expressed understanding and agreed to proceed.  Subjective:   HPI:  [name] presents today for  Chief Complaint  Patient presents with   Cough   URI   Sore Throat   [name] reports cough, congestion, rhinorrhea, sore throat, and postnasal drainage. She has been drinking warm liquids with relief of her sore throat. States the cough and rhinorrhea persists. She has Nasacort at [facility] but has not been using this. She has been taking Mucinex with some resolution of symptoms as well. No fever reported. Has not taken Tylenol for her sore throat.   Review of Systems  Constitutional:  Negative for chills, diaphoresis, fever, malaise/fatigue and weight loss.  HENT:  Positive for congestion and sore throat. Negative for ear discharge, ear pain, hearing loss, nosebleeds, sinus pain and tinnitus.   Respiratory:  Positive for cough. Negative for hemoptysis, shortness of breath, wheezing and stridor.   Cardiovascular:  Negative for chest pain and palpitations.  Gastrointestinal:  Negative for abdominal pain, diarrhea, nausea and vomiting.  Genitourinary:  Negative for dysuria.  Musculoskeletal:  Negative for back pain, falls, joint pain, myalgias and neck pain.  Skin:  Negative for itching and rash.  Neurological:  Negative for dizziness, tingling, tremors, sensory change, speech change, focal weakness, seizures, loss of consciousness, weakness and headaches.  All other systems reviewed and are negative.    Patient Active Problem  List   Diagnosis Date Noted   H/O total shoulder replacement, left 07/30/2022   Atherosclerosis 04/23/2020   Wedge fracture of lumbar vertebra ([place]) 08/20/2019   Low back pain 08/07/2019   Wrist joint pain 08/06/2019   GERD (gastroesophageal reflux disease) 02/01/2014   Allergic rhinitis 02/01/2014   Chalazion of right lower eyelid 07/17/2013   Nevus of eyelid 07/17/2013   IRRITABLE BOWEL SYNDROME 11/08/2008   HLD (hyperlipidemia) 05/14/2008   Hypertension 05/25/2007   OSTEOARTHRITIS 05/25/2007   Osteoporosis 05/25/2007   COLONIC POLYPS, HX OF 05/25/2007    Social History   Tobacco Use   Smoking status: Never   Smokeless tobacco: Never  Substance Use Topics   Alcohol use: No    Current Outpatient Medications:    Calcium Carb-Cholecalciferol (CALCIUM 1000 + D) 1000-800 MG-UNIT TABS, Take 600 mg by mouth daily., Disp: , Rfl:    cholecalciferol (VITAMIN D3) 25 MCG (1000 UNIT) tablet, Take 1,000 Units by mouth daily., Disp: , Rfl:    Coenzyme Q10 200 MG capsule, Take 200 mg by mouth daily., Disp: , Rfl:    denosumab (PROLIA) 60 MG/ML SOSY injection, Inject 60 mg into the skin every 6 (six) months., Disp: 1 mL, Rfl: 2   famotidine (PEPCID) 20 MG tablet, Take 1 tablet (20 mg total) by mouth 2 (two) times daily., Disp: 60 tablet, Rfl: 11   hyoscyamine (LEVBID) 0.375 MG 12 hr tablet, Take 1 tablet by mouth twice daily, Disp: 60 tablet, Rfl: 2   hyoscyamine (LEVSIN SL) 0.125 MG SL tablet, DISSOLVE 1 TABLET IN MOUTH EVERY 8 HOURS AS NEEDED (Patient taking differently: Take 0.125 mg by mouth every 6 (six) hours as needed  for cramping.), Disp: 90 tablet, Rfl: 0   magic mouthwash (nystatin, diphenhydrAMINE, alum & mag hydroxide) suspension mixture, Swish and swallow 5 mLs 3 (three) times daily as needed for mouth pain. Put in equal parts nystatin and Benadryl and Maalox, Disp: 240 mL, Rfl: 0   Magnesium 500 MG CAPS, Take 500 mg by mouth daily., Disp: , Rfl:    Multiple Vitamin (MULTIVITAMIN)  capsule, Take 1 capsule by mouth daily., Disp: , Rfl:    Omega-3 Fatty Acids (FISH OIL) 1000 MG CAPS, Take 1,200 mg by mouth in the morning and at bedtime. , Disp: , Rfl:    pravastatin (PRAVACHOL) 20 MG tablet, Take 1 tablet (20 mg total) by mouth at bedtime., Disp: 90 tablet, Rfl: 3   traMADol (ULTRAM) 50 MG tablet, Take 1 tablet (50 mg total) by mouth every 6 (six) hours as needed for moderate pain or severe pain., Disp: 30 tablet, Rfl: 0   Triamcinolone Acetonide (NASACORT ALLERGY 24HR NA), Place 1 spray into the nose daily., Disp: , Rfl:    Zinc 50 MG CAPS, Take 50 mg by mouth daily., Disp: , Rfl:   Allergies  Allergen Reactions   Nsaids Hives   Poison Ivy Extract Rash   Mango Flavor [Flavoring Agent] Hives   Neomycin-Polymyxin B Gu Other (See Comments)    unknown   Teriparatide Hives    Objective:   There were no vitals taken for this visit.  Patient is well-developed, well-nourished in no acute distress.  Resting comfortably at home.  Head is normocephalic, atraumatic.  No labored breathing.  Speech is clear and coherent with logical content.  Patient is alert and oriented at baseline.    Assessment and Plan:   Pailey was seen today for cough, uri and sore throat.  Diagnoses and all orders for this visit:  URI with cough and congestion No indications of acute bacterial illness. Symptoms have improved. Aware to start using Nasacort daily and to take an allergy pill daily, Claritin or Xyzal. Aware to report new, worsening, or persistent symptoms. Follow up as needed.      Return if symptoms worsen or fail to improve.  Monia Pouch, FNP-C [facility] [address] [place], [place] [address] [phone]  01/04/2023  Time spent with the patient: 12 minutes, of which >50% was spent in obtaining information about symptoms, reviewing previous labs, evaluations, and treatments, counseling about condition (please see the discussed topics  above), and developing a plan to further investigate it; had a number of questions which I addressed.

## 2023-01-05 ENCOUNTER — Encounter: Payer: Self-pay | Admitting: Family Medicine

## 2023-01-05 ENCOUNTER — Telehealth: Payer: PPO | Admitting: Family Medicine

## 2023-01-05 DIAGNOSIS — J069 Acute upper respiratory infection, unspecified: Secondary | ICD-10-CM

## 2023-01-05 NOTE — Progress Notes (Signed)
Attempted to call patient's and left voicemail, sent to video link as well, did not answer. Caryl Pina, MD [facility] 01/05/2023, 5:38 PM

## 2023-01-06 ENCOUNTER — Telehealth: Payer: Self-pay | Admitting: Family Medicine

## 2023-01-06 MED ORDER — BENZONATATE 100 MG PO CAPS: 100.0000 mg | ORAL_CAPSULE | Freq: Three times a day (TID) | ORAL | 0 refills | Status: DC | PRN

## 2023-01-06 NOTE — Addendum Note (Signed)
Addended by: Baruch Gouty on: 01/06/2023 10:02 AM   Modules accepted: Orders

## 2023-01-06 NOTE — Telephone Encounter (Signed)
Pt had a video visit with Sharyn Lull on 3/26. Says she has done everything that Sharyn Lull advised her to do to feel better but nothing is working. Pt wants to know if Sharyn Lull can send in a Rx for tessalon pearls for her?

## 2023-01-06 NOTE — Telephone Encounter (Signed)
Pt has been informed that [facility] sent in Alsey. She has no concerns.

## 2023-01-10 MED ORDER — BENZONATATE 100 MG PO CAPS: 100.0000 mg | ORAL_CAPSULE | Freq: Three times a day (TID) | ORAL | 0 refills | Status: DC | PRN

## 2023-01-20 ENCOUNTER — Ambulatory Visit (INDEPENDENT_AMBULATORY_CARE_PROVIDER_SITE_OTHER): Payer: PPO | Admitting: Nurse Practitioner

## 2023-01-20 ENCOUNTER — Encounter: Payer: Self-pay | Admitting: Nurse Practitioner

## 2023-01-20 VITALS — BP 119/73 | HR 68 | Temp 97.4°F | Resp 20 | Ht 59.0 in | Wt 142.0 lb

## 2023-01-20 DIAGNOSIS — R052 Subacute cough: Secondary | ICD-10-CM

## 2023-01-20 MED ORDER — BENZONATATE 100 MG PO CAPS: 100.0000 mg | ORAL_CAPSULE | Freq: Two times a day (BID) | ORAL | 0 refills | Status: DC | PRN

## 2023-01-20 NOTE — Patient Instructions (Signed)
Cough, Adult A cough helps to clear your throat and lungs. It may be a sign of an illness or another condition. A short-term (acute) cough may last 2-3 weeks. A long-term (chronic) cough may last 8 or more weeks. Many things can cause a cough. They include: Illnesses such as: An infection in your throat or lungs. Asthma or other heart or lung problems. Gastroesophageal reflux. This is when acid comes back up from your stomach. Breathing in things that bother (irritate) your lungs. Allergies. Postnasal drip. This is when mucus runs down the back of your throat. Smoking. Some medicines. Follow these instructions at home: Medicines Take over-the-counter and prescription medicines only as told by your doctor. Talk with your doctor before you take cough medicine (cough suppressants). Eating and drinking Do not drink alcohol. Do not drink caffeine. Drink enough fluid to keep your pee (urine) pale yellow. Lifestyle Stay away from cigarette smoke. Do not smoke or use any products that contain nicotine or tobacco. If you need help quitting, ask your doctor. Stay away from things that make you cough. These may include perfume, candles, cleaning products, or campfire smoke. General instructions  Watch for any changes to your cough. Tell your doctor about them. Always cover your mouth when you cough. If the air is dry in your home, use a cool mist vaporizer or humidifier. If your cough is worse at night, try using extra pillows to raise your head up higher while you sleep. Rest as needed. Contact a doctor if: You have new symptoms. Your symptoms get worse. You cough up pus. You have a fever that does not go away. Your cough does not get better after 2-3 weeks. Cough medicine does not help, and you are not sleeping well. You have pain that gets worse or is not helped with medicine. You are losing weight and do not know why. You have night sweats. Get help right away if: You cough up  blood. You have trouble breathing. Your heart is beating very fast. These symptoms may be an emergency. Get help right away. Call 911. Do not wait to see if the symptoms will go away. Do not drive yourself to the hospital. This information is not intended to replace advice given to you by your health care provider. Make sure you discuss any questions you have with your health care provider. Document Revised: 05/28/2022 Document Reviewed: 05/28/2022 Elsevier Patient Education  2023 [employer] Inc.  

## 2023-01-20 NOTE — Progress Notes (Signed)
   Subjective:    Patient ID: [name], female    DOB: 1945-11-16, 77 y.o.   MRN: [id number]   Chief Complaint: Cough (Productive/)   Cough This is a new problem. The current episode started 1 to 4 weeks ago. The problem has been gradually improving. Pertinent negatives include no ear pain, fever, headaches, myalgias, rhinorrhea or shortness of breath. She has tried OTC cough suppressant for the symptoms.    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  HENT:  Negative for ear pain and rhinorrhea.   Respiratory:  Positive for cough. Negative for shortness of breath.   Musculoskeletal:  Negative for myalgias.  Neurological:  Negative for headaches.       Objective:   Physical Exam Constitutional:      Appearance: Normal appearance.  Cardiovascular:     Rate and Rhythm: Normal rate and regular rhythm.     Heart sounds: Normal heart sounds.  Pulmonary:     Effort: Pulmonary effort is normal.     Breath sounds: Normal breath sounds.  Skin:    General: Skin is warm.  Neurological:     General: No focal deficit present.     Mental Status: She is alert and oriented to person, place, and time.  Psychiatric:        Mood and Affect: Mood normal.        Behavior: Behavior normal.    BP 119/73   Pulse 68   Temp (!) 97.4 F (36.3 C) (Temporal)   Resp 20   Ht 4\' 11"  (1.499  m)   Wt 142 lb (64.4 kg)   SpO2 95%   BMI 28.68 kg/m         Assessment & Plan:   [name] in today with chief complaint of Cough (Productive/)   1. Subacute cough Continue tesalon perles as eeded Force fluids RTO prn    The above assessment and management plan was discussed with the patient. The patient verbalized understanding of and has agreed to the management plan. Patient is aware to call the clinic if symptoms persist or worsen. Patient is aware when to return to the clinic for a follow-up visit. Patient educated on when it is appropriate to go to the emergency department.   Mary-Margaret Daphine Deutscher, FNP

## 2023-02-18 ENCOUNTER — Telehealth: Payer: Self-pay | Admitting: Family Medicine

## 2023-02-18 NOTE — Telephone Encounter (Signed)
Can we please check benefits for Prolia.  Injection Due Date:  03/27/2023

## 2023-02-22 ENCOUNTER — Telehealth: Payer: Self-pay

## 2023-02-22 DIAGNOSIS — M8000XS Age-related osteoporosis with current pathological fracture, unspecified site, sequela: Secondary | ICD-10-CM

## 2023-02-22 NOTE — Telephone Encounter (Signed)
Prolia VOB initiated via [email or web address] 

## 2023-02-22 NOTE — Telephone Encounter (Signed)
Created new encounter for Prolia BIV. Will route encounter back once benefit verification is complete. 

## 2023-02-24 ENCOUNTER — Other Ambulatory Visit (HOSPITAL_COMMUNITY): Payer: Self-pay

## 2023-02-24 ENCOUNTER — Encounter: Payer: Self-pay | Admitting: Gastroenterology

## 2023-02-24 NOTE — Telephone Encounter (Signed)
   20% coinsurance, no deductible, no prior auth. needed 

## 2023-02-24 NOTE — Telephone Encounter (Addendum)
Pt ready for scheduling for PROLIA on or after : 03/27/23  Out-of-pocket cost due at time of visit: $302  Primary: [employer] Prolia co-insurance: 20% Admin fee co-insurance: 0%  Secondary: --- Prolia co-insurance:  Admin fee co-insurance:   Medical Benefit Details: Date Benefits were checked: 02/22/23 Deductible: NO/ Coinsurance: 20%/ Admin Fee: 0%  Prior Auth: N/A PA# Expiration Date:    Pharmacy benefit: Copay $200 If patient wants fill through the pharmacy benefit please send prescription to:  [facility] , and include estimated need by date in rx notes. Pharmacy will ship medication directly to the office.  Patient NOT eligible for Prolia Copay Card. Copay Card can make patient's cost as little as $25. Link to apply: [email or web address]://[email or web address]  ** This summary of benefits is an estimation of the patient's out-of-pocket cost. Exact cost may very based on individual plan coverage.

## 2023-02-28 ENCOUNTER — Telehealth: Payer: Self-pay | Admitting: Family Medicine

## 2023-02-28 NOTE — Telephone Encounter (Signed)
Left detailed message on pts vm (dtr). Pt is having blood work done Wednesday (future orders)

## 2023-02-28 NOTE — Telephone Encounter (Signed)
Pt wants to know if she is supposed to have blood work done at her appt on Wednesday?

## 2023-03-01 ENCOUNTER — Other Ambulatory Visit: Payer: PPO

## 2023-03-01 DIAGNOSIS — I10 Essential (primary) hypertension: Secondary | ICD-10-CM | POA: Diagnosis not present

## 2023-03-01 DIAGNOSIS — E782 Mixed hyperlipidemia: Secondary | ICD-10-CM | POA: Diagnosis not present

## 2023-03-01 DIAGNOSIS — K219 Gastro-esophageal reflux disease without esophagitis: Secondary | ICD-10-CM

## 2023-03-01 LAB — CBC WITH DIFFERENTIAL/PLATELET
Basophils Absolute: 0 10*3/uL (ref 0.0–0.2)
Basos: 1 %
EOS (ABSOLUTE): 0.1 10*3/uL (ref 0.0–0.4)
Eos: 2 %
Hematocrit: 43.2 % (ref 34.0–46.6)
Hemoglobin: 13.6 g/dL (ref 11.1–15.9)
Immature Grans (Abs): 0 10*3/uL (ref 0.0–0.1)
Immature Granulocytes: 0 %
Lymphocytes Absolute: 1.6 10*3/uL (ref 0.7–3.1)
Lymphs: 33 %
MCH: 27 pg (ref 26.6–33.0)
MCHC: 31.5 g/dL (ref 31.5–35.7)
MCV: 86 fL (ref 79–97)
Monocytes Absolute: 0.4 10*3/uL (ref 0.1–0.9)
Monocytes: 9 %
Neutrophils Absolute: 2.6 10*3/uL (ref 1.4–7.0)
Neutrophils: 55 %
Platelets: 215 10*3/uL (ref 150–450)
RBC: 5.04 x10E6/uL (ref 3.77–5.28)
RDW: 13.1 % (ref 11.7–15.4)
WBC: 4.8 10*3/uL (ref 3.4–10.8)

## 2023-03-01 LAB — CMP14+EGFR
ALT: 15 IU/L (ref 0–32)
AST: 21 IU/L (ref 0–40)
Albumin/Globulin Ratio: 2.2 (ref 1.2–2.2)
Albumin: 4.2 g/dL (ref 3.8–4.8)
Alkaline Phosphatase: 43 IU/L — ABNORMAL LOW (ref 44–121)
BUN/Creatinine Ratio: 16 (ref 12–28)
BUN: 13 mg/dL (ref 8–27)
Bilirubin Total: 0.4 mg/dL (ref 0.0–1.2)
CO2: 25 mmol/L (ref 20–29)
Calcium: 9.2 mg/dL (ref 8.7–10.3)
Chloride: 101 mmol/L (ref 96–106)
Creatinine, Ser: 0.82 mg/dL (ref 0.57–1.00)
Globulin, Total: 1.9 g/dL (ref 1.5–4.5)
Glucose: 91 mg/dL (ref 70–99)
Potassium: 5.2 mmol/L (ref 3.5–5.2)
Sodium: 137 mmol/L (ref 134–144)
Total Protein: 6.1 g/dL (ref 6.0–8.5)
eGFR: 75 mL/min/{1.73_m2} (ref >59)

## 2023-03-01 LAB — LIPID PANEL
Chol/HDL Ratio: 2.9 ratio (ref 0.0–4.4)
Cholesterol, Total: 220 mg/dL — ABNORMAL HIGH (ref 100–199)
HDL: 76 mg/dL (ref >39)
LDL Chol Calc (NIH): 129 mg/dL — ABNORMAL HIGH (ref 0–99)
Triglycerides: 85 mg/dL (ref 0–149)
VLDL Cholesterol Cal: 15 mg/dL (ref 5–40)

## 2023-03-02 ENCOUNTER — Encounter: Payer: Self-pay | Admitting: Family Medicine

## 2023-03-02 ENCOUNTER — Ambulatory Visit (INDEPENDENT_AMBULATORY_CARE_PROVIDER_SITE_OTHER): Payer: PPO | Admitting: Family Medicine

## 2023-03-02 VITALS — BP 95/71 | HR 74 | Ht 59.0 in | Wt 141.0 lb

## 2023-03-02 DIAGNOSIS — K219 Gastro-esophageal reflux disease without esophagitis: Secondary | ICD-10-CM | POA: Diagnosis not present

## 2023-03-02 DIAGNOSIS — I7090 Unspecified atherosclerosis: Secondary | ICD-10-CM

## 2023-03-02 DIAGNOSIS — E782 Mixed hyperlipidemia: Secondary | ICD-10-CM | POA: Diagnosis not present

## 2023-03-02 DIAGNOSIS — I10 Essential (primary) hypertension: Secondary | ICD-10-CM | POA: Diagnosis not present

## 2023-03-02 NOTE — Progress Notes (Signed)
BP 95/71   Pulse 74   Ht 4\' 11"  (1.499 m)   Wt 141 lb (64 kg)   SpO2 95%   BMI 28.48 kg/m    Subjective:   Patient ID: [name], female    DOB: 1945-10-16, 77 y.o.   MRN: [id number]  HPI: [name] is a 77 y.o. female presenting on 03/02/2023 for Medical Management of Chronic Issues and Hyperlipidemia   HPI Hyperlipidemia Patient is coming in for recheck of his hyperlipidemia. The patient is currently taking pravastatin every other day and fish oils. They deny any issues with myalgias or history of liver damage from it. They deny any focal numbness or weakness or chest pain.   Hypertension Patient is currently on no medicine currently, diet control, and their blood pressure today is 95/71. Patient denies any lightheadedness or dizziness. Patient denies headaches, blurred vision, chest pains, shortness of breath, or weakness. Denies any side effects from medication and is content with current medication.   GERD Patient is currently on no medicine currently.  She denies any major symptoms or abdominal pain or belching or burping. She denies any blood in her stool or lightheadedness or dizziness.   Relevant past medical, surgical, family and social history reviewed and updated as indicated. Interim medical history since our last visit reviewed. Allergies and medications reviewed and updated.  Review of Systems  Constitutional:  Negative for chills and fever.  HENT:  Negative for congestion, ear discharge and ear pain.   Eyes:  Negative for redness and visual disturbance.  Respiratory:  Negative for chest tightness and shortness of breath.   Cardiovascular:  Negative for chest pain and leg swelling.  Genitourinary:  Negative for difficulty urinating and dysuria.  Musculoskeletal:  Negative for back pain and gait problem.  Skin:  Negative for rash.  Neurological:  Negative for light-headedness and headaches.  Psychiatric/Behavioral:  Negative for agitation and behavioral  problems.   All other systems reviewed and are negative.   Per HPI unless specifically indicated above   Allergies as of 03/02/2023       Reactions   Nsaids Hives   Poison Ivy Extract Rash   Mango Flavor [flavoring Agent] Hives   Neomycin-polymyxin B Gu Other (See Comments)   unknown   Teriparatide Hives        Medication List        Accurate as of Mar 02, 2023 12:13 PM. If you have any questions, ask your nurse or doctor.          benzonatate 100 MG capsule Commonly known as: Tessalon Perles Take 1 capsule (100 mg total) by mouth 3 (three) times daily as needed for cough.   benzonatate 100 MG capsule Commonly known as: TESSALON Take 1 capsule (100 mg total) by mouth 2 (two) times daily as needed for cough.   Calcium 1000 + D 1000-20 MG-MCG Tabs Generic drug: Calcium Carb-Cholecalciferol Take 600 mg by mouth daily.   cholecalciferol 25 MCG (1000 UNIT) tablet Commonly known as: VITAMIN D3 Take 1,000 Units by mouth daily.   Coenzyme Q10 200 MG capsule Take 200 mg by mouth daily.   denosumab 60 MG/ML Sosy injection Commonly known as: PROLIA Inject 60 mg into the skin every 6 (six) months.   Fish Oil 1000 MG Caps Take 1,200 mg by mouth in the morning and at bedtime.   hyoscyamine 0.125 MG SL tablet Commonly known as: LEVSIN SL DISSOLVE 1 TABLET IN MOUTH EVERY 8 HOURS AS NEEDED  What changed: See the new instructions.   hyoscyamine 0.375 MG 12 hr tablet Commonly known as: LEVBID Take 1 tablet by mouth twice daily What changed: Another medication with the same name was changed. Make sure you understand how and when to take each.   magic mouthwash (nystatin, diphenhydrAMINE, alum & mag hydroxide) suspension mixture Swish and swallow 5 mLs 3 (three) times daily as needed for mouth pain. Put in equal parts nystatin and Benadryl and Maalox   Magnesium 500 MG Caps Take 500 mg by mouth daily.   multivitamin capsule Take 1 capsule by mouth daily.   NASACORT  ALLERGY 24HR NA Place 1 spray into the nose daily.   pravastatin 20 MG tablet Commonly known as: PRAVACHOL Take 1 tablet (20 mg total) by mouth at bedtime.         Objective:   BP 95/71   Pulse 74   Ht 4\' 11"  (1.499 m)   Wt 141 lb (64 kg)   SpO2 95%   BMI 28.48 kg/m   Wt Readings from Last 3 Encounters:  03/02/23 141 lb (64 kg)  01/20/23 142 lb (64.4 kg)  11/19/22 142 lb (64.4 kg)    Physical Exam Vitals and nursing note reviewed.  Constitutional:      General: She is not in acute distress.    Appearance: She is well-developed. She is not diaphoretic.  Eyes:     Conjunctiva/sclera: Conjunctivae normal.     Pupils: Pupils are equal, round, and reactive to light.  Cardiovascular:     Rate and Rhythm: Normal rate and regular rhythm.     Heart sounds: Normal heart sounds. No murmur heard. Pulmonary:     Effort: Pulmonary effort is normal. No respiratory distress.     Breath sounds: Normal breath sounds. No wheezing.  Musculoskeletal:        General: No tenderness. Normal range of motion.  Skin:    General: Skin is warm and dry.     Findings: No rash.  Neurological:     Mental Status: She is alert and oriented to person, place, and time.     Coordination: Coordination normal.  Psychiatric:        Behavior: Behavior normal.     Results for orders placed or performed in visit on 03/01/23  CBC with Differential/Platelet  Result Value Ref Range   WBC 4.8 3.4 - 10.8 x10E3/uL   RBC 5.04 3.77 - 5.28 x10E6/uL   Hemoglobin 13.6 11.1 - 15.9 g/dL   Hematocrit 16.1 09.6 - 46.6 %   MCV 86 79 - 97 fL   MCH 27.0 26.6 - 33.0 pg   MCHC 31.5 31.5 - 35.7 g/dL   RDW 04.5 40.9 - 81.1 %   Platelets 215 150 - 450 x10E3/uL   Neutrophils 55 Not Estab. %   Lymphs 33 Not Estab. %   Monocytes 9 Not Estab. %   Eos 2 Not Estab. %   Basos 1 Not Estab. %   Neutrophils Absolute 2.6 1.4 - 7.0 x10E3/uL   Lymphocytes Absolute 1.6 0.7 - 3.1 x10E3/uL   Monocytes Absolute 0.4 0.1 - 0.9  x10E3/uL   EOS (ABSOLUTE) 0.1 0.0 - 0.4 x10E3/uL   Basophils Absolute 0.0 0.0 - 0.2 x10E3/uL   Immature Granulocytes 0 Not Estab. %   Immature Grans (Abs) 0.0 0.0 - 0.1 x10E3/uL  CMP14+EGFR  Result Value Ref Range   Glucose 91 70 - 99 mg/dL   BUN 13 8 - 27 mg/dL   Creatinine,  Ser 0.82 0.57 - 1.00 mg/dL   eGFR 75 >16 XW/RUE/4.54   BUN/Creatinine Ratio 16 12 - 28   Sodium 137 134 - 144 mmol/L   Potassium 5.2 3.5 - 5.2 mmol/L   Chloride 101 96 - 106 mmol/L   CO2 25 20 - 29 mmol/L   Calcium 9.2 8.7 - 10.3 mg/dL   Total Protein 6.1 6.0 - 8.5 g/dL   Albumin 4.2 3.8 - 4.8 g/dL   Globulin, Total 1.9 1.5 - 4.5 g/dL   Albumin/Globulin Ratio 2.2 1.2 - 2.2   Bilirubin Total 0.4 0.0 - 1.2 mg/dL   Alkaline Phosphatase 43 (L) 44 - 121 IU/L   AST 21 0 - 40 IU/L   ALT 15 0 - 32 IU/L  Lipid panel  Result Value Ref Range   Cholesterol, Total 220 (H) 100 - 199 mg/dL   Triglycerides 85 0 - 149 mg/dL   HDL 76 >09 mg/dL   VLDL Cholesterol Cal 15 5 - 40 mg/dL   LDL Chol Calc (NIH) 811 (H) 0 - 99 mg/dL   Chol/HDL Ratio 2.9 0.0 - 4.4 ratio    Assessment & Plan:   Problem List Items Addressed This Visit       Cardiovascular and Mediastinum   Hypertension   Relevant Orders   Ambulatory referral to Cardiology   Atherosclerosis   Relevant Orders   Ambulatory referral to Cardiology     Digestive   GERD (gastroesophageal reflux disease)     Other   HLD (hyperlipidemia) - Primary   Relevant Orders   Ambulatory referral to Cardiology    Cholesterol previously with an LDL of 1 heavily recommended focusing on diet and getting it back down with diet.  She continues to take pravastatin every other day and fish oils. Follow up plan: Return in about 6 months (around 09/02/2023), or if symptoms worsen or fail to improve, for Hyperlipidemia recheck.  Counseling provided for all of the vaccine components Orders Placed This Encounter  Procedures   Ambulatory referral to Cardiology    Arville Care, MD [facility] 03/02/2023, 12:13 PM

## 2023-03-14 MED ORDER — DENOSUMAB 60 MG/ML ~~LOC~~ SOSY
60.0000 mg | PREFILLED_SYRINGE | SUBCUTANEOUS | 2 refills | Status: DC
Start: 2023-03-14 — End: 2024-02-22

## 2023-03-14 NOTE — Telephone Encounter (Signed)
Rx sent to [facility] pharmacy for patient. Patient will call [employer] and schedule payment and delivery.

## 2023-03-14 NOTE — Addendum Note (Signed)
Addended by: Tamera Punt on: 03/14/2023 02:14 PM   Modules accepted: Orders

## 2023-03-16 NOTE — Telephone Encounter (Signed)
Phone call received to schedule delivery for Friday 03/18/23.  Will be delivered via [employer] and require a signature.  Clinical staff alerted to watch for delivery, place in refrigerator and call patient to schedule appointment for injection.

## 2023-03-22 NOTE — Telephone Encounter (Signed)
Message left for patient that we have received Prolia and to please call to schedule appointment. Patient is due on or after 03/27/23

## 2023-03-30 ENCOUNTER — Ambulatory Visit: Payer: PPO

## 2023-03-30 ENCOUNTER — Ambulatory Visit (INDEPENDENT_AMBULATORY_CARE_PROVIDER_SITE_OTHER): Payer: PPO | Admitting: *Deleted

## 2023-03-30 DIAGNOSIS — M8000XS Age-related osteoporosis with current pathological fracture, unspecified site, sequela: Secondary | ICD-10-CM

## 2023-03-30 MED ORDER — DENOSUMAB 60 MG/ML ~~LOC~~ SOSY
60.0000 mg | PREFILLED_SYRINGE | Freq: Once | SUBCUTANEOUS | Status: AC
Start: 2023-03-30 — End: 2023-03-30
  Administered 2023-03-30: 60 mg via SUBCUTANEOUS

## 2023-03-30 NOTE — Progress Notes (Signed)
Prolia shot given left upper arm subcutaneously. Patient tolerated well.

## 2023-05-26 DIAGNOSIS — R0781 Pleurodynia: Secondary | ICD-10-CM | POA: Diagnosis not present

## 2023-05-26 DIAGNOSIS — M25551 Pain in right hip: Secondary | ICD-10-CM | POA: Diagnosis not present

## 2023-06-15 ENCOUNTER — Ambulatory Visit (INDEPENDENT_AMBULATORY_CARE_PROVIDER_SITE_OTHER): Payer: PPO

## 2023-06-15 VITALS — Ht 60.0 in | Wt 140.0 lb

## 2023-06-15 DIAGNOSIS — Z0000 Encounter for general adult medical examination without abnormal findings: Secondary | ICD-10-CM

## 2023-06-15 DIAGNOSIS — Z1211 Encounter for screening for malignant neoplasm of colon: Secondary | ICD-10-CM
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
## 2023-06-15 NOTE — Patient Instructions (Signed)
Ms. Pradia , Thank you for taking time to come for your Medicare Wellness Visit. I appreciate your ongoing commitment to your health goals. Please review the following plan we discussed and let me know if I can assist you in the future.   Referrals/Orders/Follow-Ups/Clinician Recommendations: Aim for 30 minutes of exercise or brisk walking, 6-8 glasses of water, and 5 servings of fruits and vegetables each day.   This is a list of the screening recommended for you and due dates:  Health Maintenance  Topic Date Due   Colon Cancer Screening  03/18/2023   Flu Shot  05/12/2023   COVID-19 Vaccine (4 - 2023-24 season) 06/12/2023   Zoster (Shingles) Vaccine (1 of 2) 11/21/2023*   DEXA scan (bone density measurement)  07/25/2023   Mammogram  11/27/2023   Medicare Annual Wellness Visit  06/14/2024   DTaP/Tdap/Td vaccine (3 - Tdap) 06/19/2026   Pneumonia Vaccine  Completed   Hepatitis C Screening  Completed   HPV Vaccine  Aged Out  *Topic was postponed. The date shown is not the original due date.    Advanced directives: (Copy Requested) Please bring a copy of your health care power of attorney and living will to the office to be added to your chart at your convenience.  Next Medicare Annual Wellness Visit scheduled for next year: Yes  Insert Preventive Care attachment Insert FALL PREVENTION attachment if needed

## 2023-07-07 ENCOUNTER — Ambulatory Visit: Payer: PPO

## 2023-07-07 ENCOUNTER — Telehealth: Payer: Self-pay | Admitting: Family Medicine

## 2023-07-07 DIAGNOSIS — I10 Essential (primary) hypertension: Secondary | ICD-10-CM

## 2023-07-07 DIAGNOSIS — Z23 Encounter for immunization: Secondary | ICD-10-CM

## 2023-07-07 DIAGNOSIS — E782 Mixed hyperlipidemia: Secondary | ICD-10-CM

## 2023-07-07 NOTE — Telephone Encounter (Signed)
Future labs order.

## 2023-07-22 DIAGNOSIS — M19071 Primary osteoarthritis, right ankle and foot: Secondary | ICD-10-CM | POA: Diagnosis not present

## 2023-07-22 DIAGNOSIS — M25571 Pain in right ankle and joints of right foot: Secondary | ICD-10-CM | POA: Diagnosis not present

## 2023-07-22 DIAGNOSIS — M216X9 Other acquired deformities of unspecified foot: Secondary | ICD-10-CM | POA: Diagnosis not present

## 2023-07-22 DIAGNOSIS — M19079 Primary osteoarthritis, unspecified ankle and foot: Secondary | ICD-10-CM | POA: Diagnosis not present

## 2023-07-27 ENCOUNTER — Encounter: Payer: Self-pay | Admitting: Family Medicine

## 2023-07-27 DIAGNOSIS — M8000XS Age-related osteoporosis with current pathological fracture, unspecified site, sequela: Secondary | ICD-10-CM

## 2023-07-29 DIAGNOSIS — M25571 Pain in right ankle and joints of right foot: Secondary | ICD-10-CM | POA: Diagnosis not present

## 2023-08-08 DIAGNOSIS — Z8262 Family history of osteoporosis: Secondary | ICD-10-CM | POA: Diagnosis not present

## 2023-08-08 DIAGNOSIS — M810 Age-related osteoporosis without current pathological fracture: Secondary | ICD-10-CM | POA: Diagnosis not present

## 2023-08-08 DIAGNOSIS — R29890 Loss of height: Secondary | ICD-10-CM | POA: Diagnosis not present

## 2023-08-08 DIAGNOSIS — M8588 Other specified disorders of bone density and structure, other site: Secondary | ICD-10-CM | POA: Diagnosis not present

## 2023-08-16 ENCOUNTER — Encounter: Payer: Self-pay | Admitting: Family Medicine

## 2023-08-30 ENCOUNTER — Other Ambulatory Visit: Payer: PPO

## 2023-08-30 DIAGNOSIS — I10 Essential (primary) hypertension: Secondary | ICD-10-CM

## 2023-08-30 DIAGNOSIS — E782 Mixed hyperlipidemia: Secondary | ICD-10-CM | POA: Diagnosis not present

## 2023-08-31 LAB — CMP14+EGFR
ALT: 15 IU/L (ref 0–32)
AST: 18 [IU]/L (ref 0–40)
Albumin: 4.3 g/dL (ref 3.8–4.8)
Alkaline Phosphatase: 41 IU/L — ABNORMAL LOW (ref 44–121)
BUN/Creatinine Ratio: 15 (ref 12–28)
BUN: 14 mg/dL (ref 8–27)
Bilirubin Total: 0.5 mg/dL (ref 0.0–1.2)
CO2: 24 mmol/L (ref 20–29)
Calcium: 9.9 mg/dL (ref 8.7–10.3)
Chloride: 103 mmol/L (ref 96–106)
Creatinine, Ser: 0.92 mg/dL (ref 0.57–1.00)
Globulin, Total: 2.1 g/dL (ref 1.5–4.5)
Glucose: 89 mg/dL (ref 70–99)
Potassium: 5.3 mmol/L — ABNORMAL HIGH (ref 3.5–5.2)
Sodium: 142 mmol/L (ref 134–144)
Total Protein: 6.4 g/dL (ref 6.0–8.5)
eGFR: 65 mL/min/{1.73_m2} (ref >59)

## 2023-08-31 LAB — LIPID PANEL
Chol/HDL Ratio: 3.1 ratio (ref 0.0–4.4)
Cholesterol, Total: 219 mg/dL — ABNORMAL HIGH (ref 100–199)
HDL: 70 mg/dL (ref >39)
LDL Chol Calc (NIH): 127 mg/dL — ABNORMAL HIGH (ref 0–99)
Triglycerides: 123 mg/dL (ref 0–149)
VLDL Cholesterol Cal: 22 mg/dL (ref 5–40)

## 2023-09-05 ENCOUNTER — Ambulatory Visit (INDEPENDENT_AMBULATORY_CARE_PROVIDER_SITE_OTHER): Payer: PPO | Admitting: Family Medicine

## 2023-09-05 ENCOUNTER — Ambulatory Visit: Payer: PPO | Admitting: Family Medicine

## 2023-09-05 ENCOUNTER — Encounter: Payer: Self-pay | Admitting: Family Medicine

## 2023-09-05 ENCOUNTER — Other Ambulatory Visit: Payer: PPO

## 2023-09-05 ENCOUNTER — Telehealth: Payer: Self-pay

## 2023-09-05 VITALS — BP 107/66 | HR 79 | Ht 60.0 in | Wt 141.0 lb

## 2023-09-05 DIAGNOSIS — I7090 Unspecified atherosclerosis: Secondary | ICD-10-CM | POA: Diagnosis not present

## 2023-09-05 DIAGNOSIS — I10 Essential (primary) hypertension: Secondary | ICD-10-CM

## 2023-09-05 DIAGNOSIS — Z23 Encounter for immunization: Secondary | ICD-10-CM

## 2023-09-05 DIAGNOSIS — E782 Mixed hyperlipidemia: Secondary | ICD-10-CM

## 2023-09-05 MED ORDER — PRAVASTATIN SODIUM 20 MG PO TABS: 40.0000 mg | ORAL_TABLET | Freq: Every day | ORAL | 3 refills | Status: DC

## 2023-09-05 NOTE — Telephone Encounter (Signed)
Pt seen in office today and wanted to know when her next Prolia injection is due.  Last injection 03/30/2023.  Pt is on the Dec spreadsheet per Toniann Fail. No need to send message.

## 2023-09-05 NOTE — Progress Notes (Signed)
BP 107/66   Pulse 79   Ht 5' (1.524 m)   Wt 141 lb (64 kg)   SpO2 98%   BMI 27.54 kg/m    Subjective:   Patient ID: [name], female    DOB: Mar 31, 1946, 77 y.o.   MRN: [id number]  HPI: Tara PETTAWAY is a 77 y.o. female presenting on 09/05/2023 for No chief complaint on file.  Hypertension Patient has a history of hypertension, but she is not currently taking any medications. Her blood pressure today is 107/66. Her blood pressure will occasionally be in the 150s systolic at home but remains <130/90 most of the time. She denies lightheadedness or dizziness, vision changes, or shortness of breath.    Hyperlipidemia Patient is currently taking pravastatin. She recently increased the dose from pravastatin 20 mg daily to 40 mg daily after seeing her lab results. She denies myalgias or weakness. She does not have a history of liver damage from it.   GERD Patient is currently taking Pepcid as needed. She takes the medicine about every other day when she has chest pain from indigestion. The chest pain is pleuritic and generally occurs after eating foods like a sausage biscuit. She denies abdominal pain, burping, dysphagia, or blood in her stool.   Relevant past medical, surgical, family and social history reviewed and updated as indicated. Interim medical history since our last visit reviewed. Allergies and medications reviewed and updated.  Review of Systems  Constitutional:  Negative for chills and fever.  HENT:  Negative for congestion, sinus pressure and sore throat.   Eyes:  Negative for visual disturbance.  Respiratory:  Negative for cough, chest tightness and shortness of breath.   Cardiovascular:  Negative for chest pain, palpitations and leg swelling.  Gastrointestinal:  Negative for abdominal pain, blood in stool, constipation and diarrhea.  Genitourinary:  Negative for difficulty urinating, dysuria and hematuria.  Musculoskeletal:  Positive for arthralgias and back pain.  Negative for myalgias.  Neurological:  Positive for headaches. Negative for dizziness, syncope, weakness and numbness.       Occasional tension headaches   Per HPI unless specifically indicated above  Allergies as of 09/05/2023       Reactions   Nsaids Hives   Poison Ivy Extract Rash   Mango Flavor [flavoring Agent] Hives   Neomycin-polymyxin B Gu Other (See Comments)   unknown   Teriparatide Hives        Medication List        Accurate as of September 05, 2023  2:14 PM. If you have any questions, ask your nurse or doctor.          STOP taking these medications    benzonatate 100 MG capsule Commonly known as: TESSALON Stopped by: Elige Radon Reeanna Acri   magic mouthwash (nystatin, diphenhydrAMINE, alum & mag hydroxide) suspension mixture Stopped by: Elige Radon Sadao Weyer   pravastatin 20 MG tablet Commonly known as: PRAVACHOL Stopped by: Elige Radon Almalik Weissberg   pravastatin 40 MG tablet Commonly known as: PRAVACHOL Stopped by: Elige Radon Quantia Grullon       TAKE these medications    Calcium 1000 + D 1000-800 MG-UNIT Tabs Generic drug: Calcium Carb-Cholecalciferol Take 600 mg by mouth daily.   cholecalciferol 25 MCG (1000 UNIT) tablet Commonly known as: VITAMIN D3 Take 1,000 Units by mouth daily.   Coenzyme Q10 200 MG capsule Take 200 mg by mouth daily.   denosumab 60 MG/ML Sosy injection Commonly known as: PROLIA Inject 60 mg into the  skin every 6 (six) months.   Fish Oil 1000 MG Caps Take 1,200 mg by mouth in the morning and at bedtime.   hyoscyamine 0.125 MG SL tablet Commonly known as: LEVSIN SL DISSOLVE 1 TABLET IN MOUTH EVERY 8 HOURS AS NEEDED What changed: See the new instructions.   hyoscyamine 0.375 MG 12 hr tablet Commonly known as: LEVBID Take 1 tablet by mouth twice daily What changed: Another medication with the same name was changed. Make sure you understand how and when to take each.   Magnesium 500 MG Caps Take 500 mg by mouth daily.    multivitamin capsule Take 1 capsule by mouth daily.   NASACORT ALLERGY 24HR NA Place 1 spray into the nose daily.         Objective:   BP 107/66   Pulse 79   Ht 5' (1.524 m)   Wt 141 lb (64 kg)   SpO2 98%   BMI 27.54 kg/m   Wt Readings from Last 3 Encounters:  09/05/23 141 lb (64 kg)  06/15/23 140 lb (63.5 kg)  03/02/23 141 lb (64 kg)    Physical Exam Vitals and nursing note reviewed.  Constitutional:      General: She is not in acute distress.    Appearance: Normal appearance.  HENT:     Head: Normocephalic and atraumatic.     Right Ear: Tympanic membrane, ear canal and external ear normal.     Left Ear: Tympanic membrane, ear canal and external ear normal.  Eyes:     Conjunctiva/sclera: Conjunctivae normal.  Cardiovascular:     Rate and Rhythm: Normal rate and regular rhythm.     Heart sounds: Normal heart sounds.  Pulmonary:     Effort: Pulmonary effort is normal.     Breath sounds: Normal breath sounds. No wheezing or rales.  Abdominal:     General: Abdomen is flat. There is no distension.     Palpations: Abdomen is soft.     Tenderness: There is no abdominal tenderness. There is no right CVA tenderness or left CVA tenderness.  Musculoskeletal:     Cervical back: Normal range of motion and neck supple. No tenderness.     Right lower leg: No edema.     Left lower leg: No edema.  Skin:    General: Skin is warm and dry.  Neurological:     Mental Status: She is alert and oriented to person, place, and time.  Psychiatric:        Mood and Affect: Mood normal.        Behavior: Behavior normal.        Thought Content: Thought content normal.        Judgment: Judgment normal.    Results for orders placed or performed in visit on 08/30/23  CMP14+EGFR  Result Value Ref Range   Glucose 89 70 - 99 mg/dL   BUN 14 8 - 27 mg/dL   Creatinine, Ser 4.78 0.57 - 1.00 mg/dL   eGFR 65 >29 FA/OZH/0.86   BUN/Creatinine Ratio 15 12 - 28   Sodium 142 134 - 144 mmol/L    Potassium 5.3 (H) 3.5 - 5.2 mmol/L   Chloride 103 96 - 106 mmol/L   CO2 24 20 - 29 mmol/L   Calcium 9.9 8.7 - 10.3 mg/dL   Total Protein 6.4 6.0 - 8.5 g/dL   Albumin 4.3 3.8 - 4.8 g/dL   Globulin, Total 2.1 1.5 - 4.5 g/dL   Bilirubin Total 0.5 0.0 -  1.2 mg/dL   Alkaline Phosphatase 41 (L) 44 - 121 IU/L   AST 18 0 - 40 IU/L   ALT 15 0 - 32 IU/L  Lipid panel  Result Value Ref Range   Cholesterol, Total 219 (H) 100 - 199 mg/dL   Triglycerides 295 0 - 149 mg/dL   HDL 70 >62 mg/dL   VLDL Cholesterol Cal 22 5 - 40 mg/dL   LDL Chol Calc (NIH) 130 (H) 0 - 99 mg/dL   Chol/HDL Ratio 3.1 0.0 - 4.4 ratio    Assessment & Plan:   Problem List Items Addressed This Visit       Cardiovascular and Mediastinum   Hypertension   Relevant Medications   pravastatin (PRAVACHOL) 20 MG tablet   Atherosclerosis   Relevant Medications   pravastatin (PRAVACHOL) 20 MG tablet     Other   HLD (hyperlipidemia) - Primary   Relevant Medications   pravastatin (PRAVACHOL) 20 MG tablet    Patient is doing well, staying very active. Blood pressure well-controlled without medication at 107/66. Cholesterol elevated with LDL 127. Will recheck lipid panel at next visit since patient has recently increased dose to pravastatin 40 mg. Encouraged patient to continue monitoring diet especially during the upcoming holiday season. Will not make any other medication changes.  Follow up plan: Return in about 6 months (around 03/04/2024), or if symptoms worsen or fail to improve, for Hypertension cholesterol recheck.  Counseling provided for all of the vaccine components No orders of the defined types were placed in this encounter.  Gillermina Phy, Medical Student [facility] 09/05/2023, 2:14 PM  Patient seen and examined with Gillermina Phy medical student.  Agree with assessment and plan above.  Blood pressure looks good.  Cholesterol is elevated with an LDL of 127 she just increased her  pravastatin recently but too recently to show up on the blood work. Arville Care, MD [facility] 09/15/2023, 1:51 PM

## 2023-09-13 NOTE — Telephone Encounter (Signed)
Copied from CRM [id number]. Topic: Clinical - Medication Question >> Sep 13, 2023 10:27 AM Clayton Bibles wrote: Reason for CRM: [name] is calling to schedule Io's Prolia Injection. Please call her at [phone]; you can talk to anyone from 8 AM - 10 PM.

## 2023-09-29 ENCOUNTER — Ambulatory Visit: Payer: PPO

## 2023-09-29 DIAGNOSIS — M8000XS Age-related osteoporosis with current pathological fracture, unspecified site, sequela: Secondary | ICD-10-CM | POA: Diagnosis not present

## 2023-09-29 MED ORDER — DENOSUMAB 60 MG/ML ~~LOC~~ SOSY
60.0000 mg | PREFILLED_SYRINGE | Freq: Once | SUBCUTANEOUS | Status: AC
  Administered 2023-09-29: 60 mg via SUBCUTANEOUS

## 2023-09-29 NOTE — Progress Notes (Signed)
Patient is in office today for a nurse visit for  Prolia injection.   . Patient Injection was given in the  Right arm. Patient tolerated injection well.

## 2023-09-30 ENCOUNTER — Encounter: Payer: Self-pay | Admitting: Family Medicine

## 2023-11-03 NOTE — Addendum Note (Signed)
Addended by: Dorene Sorrow on: 11/03/2023 12:53 PM   Modules accepted: Orders

## 2023-11-18 DIAGNOSIS — H40033 Anatomical narrow angle, bilateral: Secondary | ICD-10-CM | POA: Diagnosis not present

## 2023-11-18 DIAGNOSIS — H2513 Age-related nuclear cataract, bilateral: Secondary | ICD-10-CM | POA: Diagnosis not present

## 2023-12-08 ENCOUNTER — Telehealth: Payer: PPO | Admitting: Physician Assistant

## 2023-12-08 DIAGNOSIS — J019 Acute sinusitis, unspecified: Secondary | ICD-10-CM | POA: Diagnosis not present

## 2023-12-08 DIAGNOSIS — B9689 Other specified bacterial agents as the cause of diseases classified elsewhere: Secondary | ICD-10-CM

## 2023-12-08 MED ORDER — DOXYCYCLINE HYCLATE 100 MG PO TABS: 100.0000 mg | ORAL_TABLET | Freq: Two times a day (BID) | ORAL | 0 refills | Status: DC

## 2023-12-08 NOTE — Progress Notes (Signed)
 I have spent 5 minutes in review of e-visit questionnaire, review and updating patient chart, medical decision making and response to patient.   Piedad Climes, PA-C

## 2023-12-08 NOTE — Progress Notes (Signed)

## 2023-12-30 DIAGNOSIS — Z1231 Encounter for screening mammogram for malignant neoplasm of breast: Secondary | ICD-10-CM | POA: Diagnosis not present

## 2023-12-30 LAB — HM MAMMOGRAPHY

## 2024-01-03 IMAGING — US US ABDOMEN COMPLETE
1 series · 14 of 25 positions shown · non-contrast
Comparison: None.

CLINICAL DATA: Upper abdominal pain.

EXAM:
ABDOMEN ULTRASOUND COMPLETE

[Series 1: us abdomen complete · 14 of 108 slices shown]
[im 1/108]
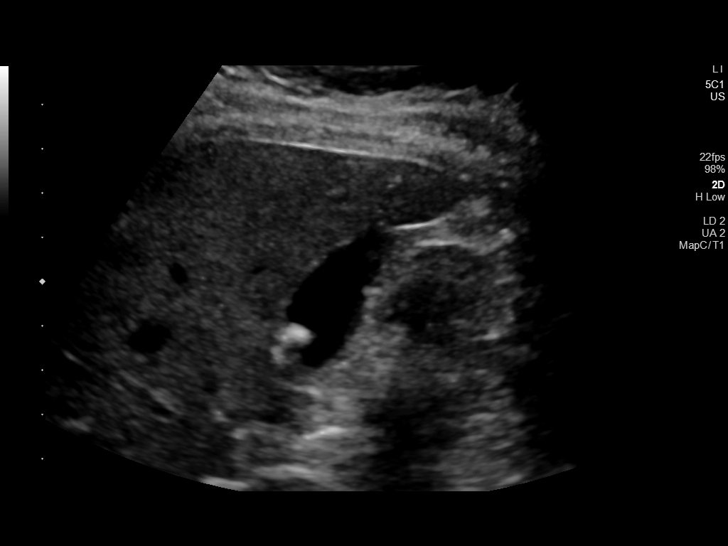
[im 9/108]
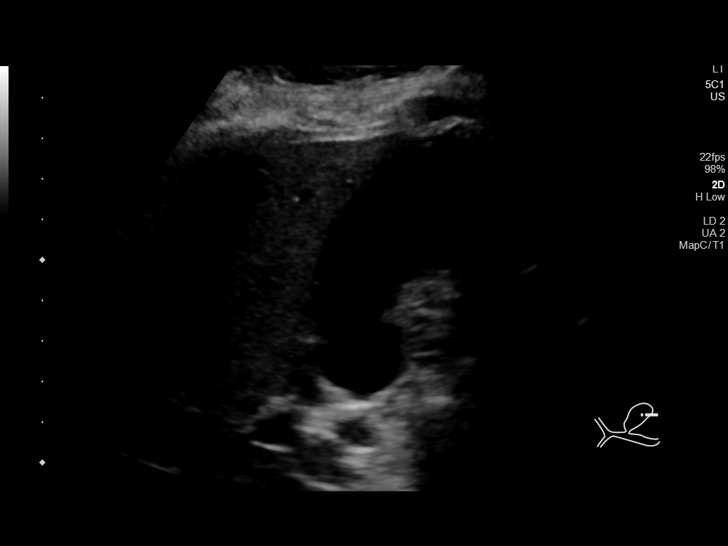
[im 18/108]
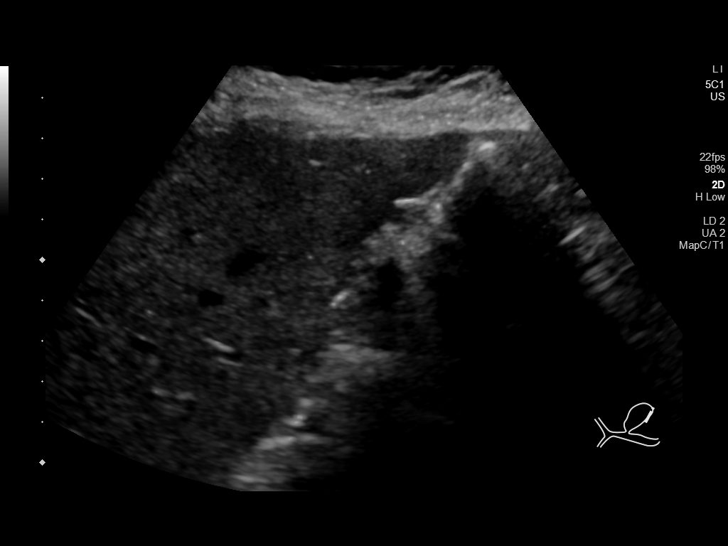
[im 27/108]
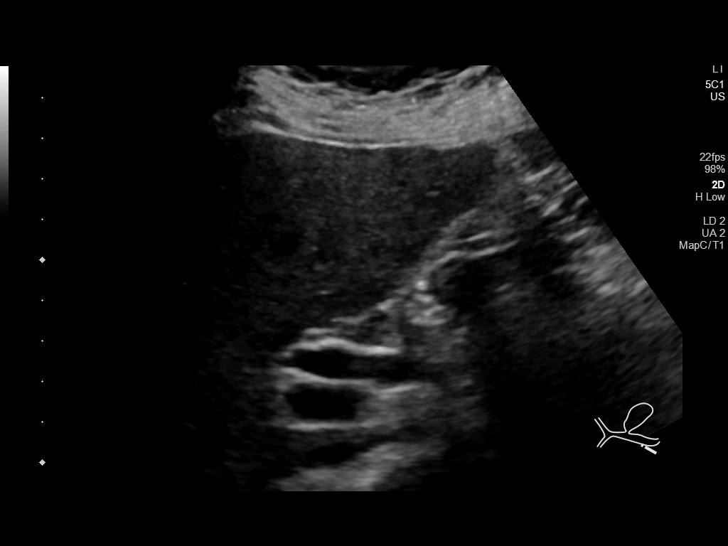
[im 36/108]
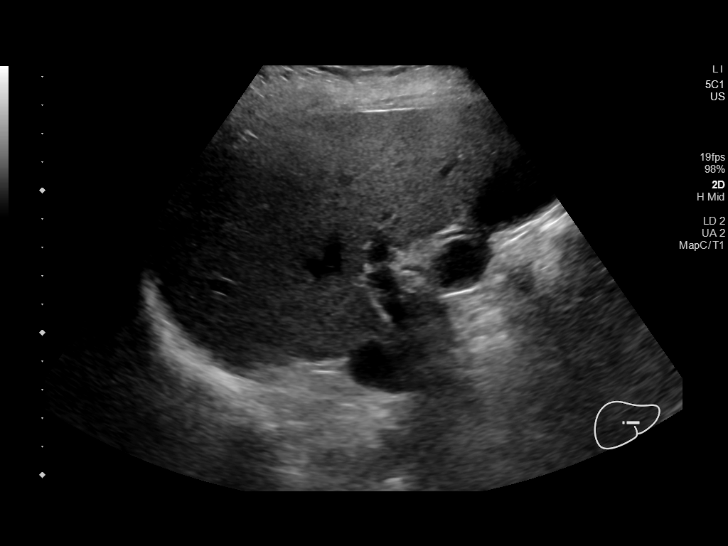
[im 41/108]
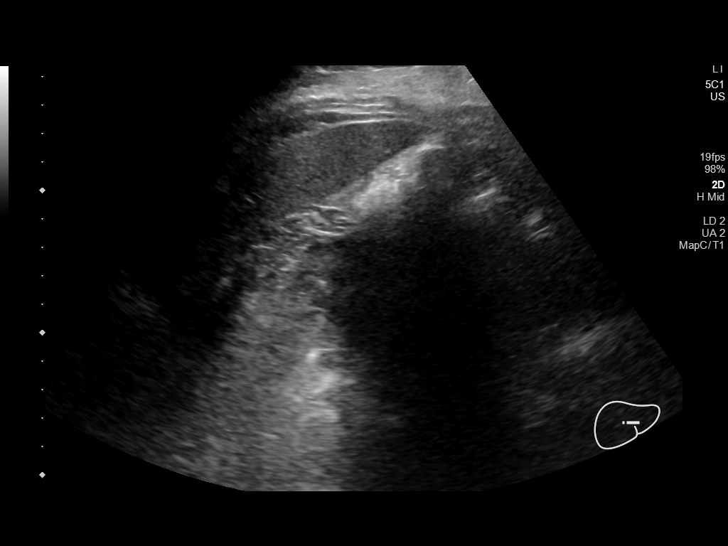
[im 50/108]
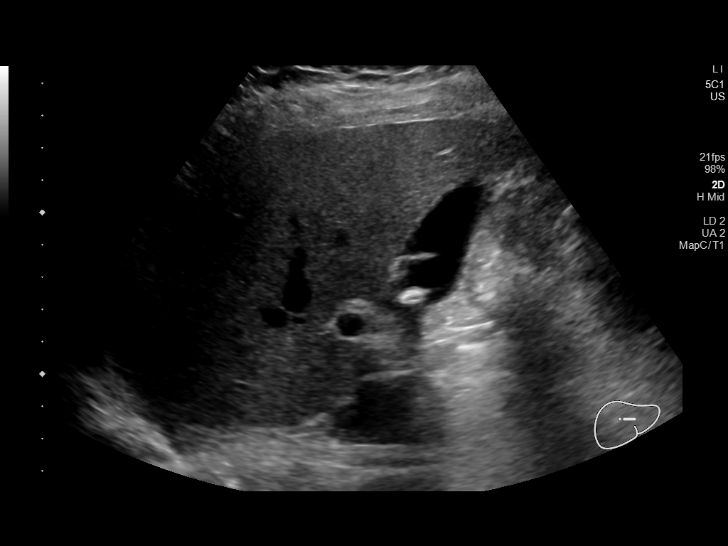
[im 58/108]
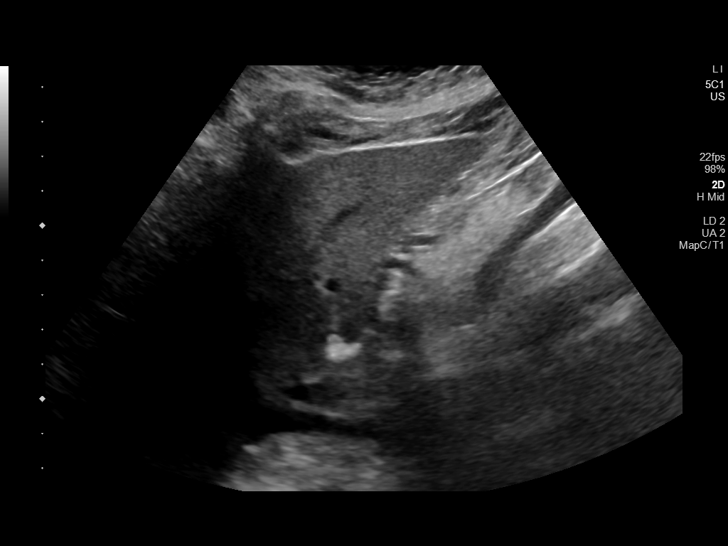
[im 67/108]
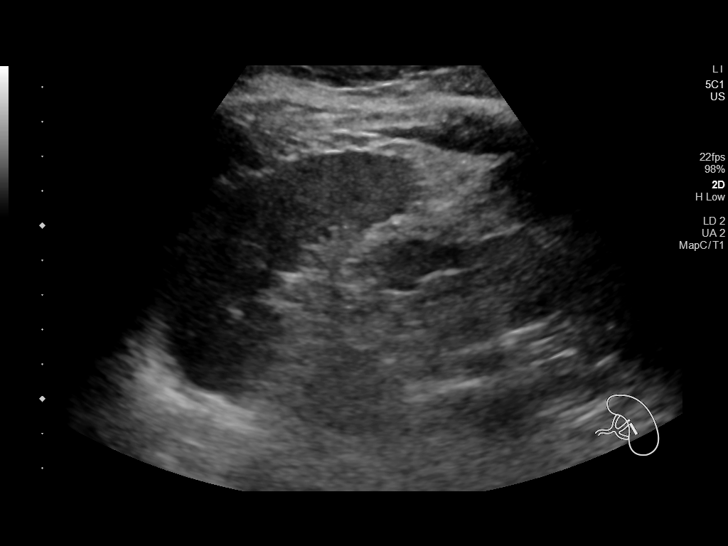
[im 72/108]
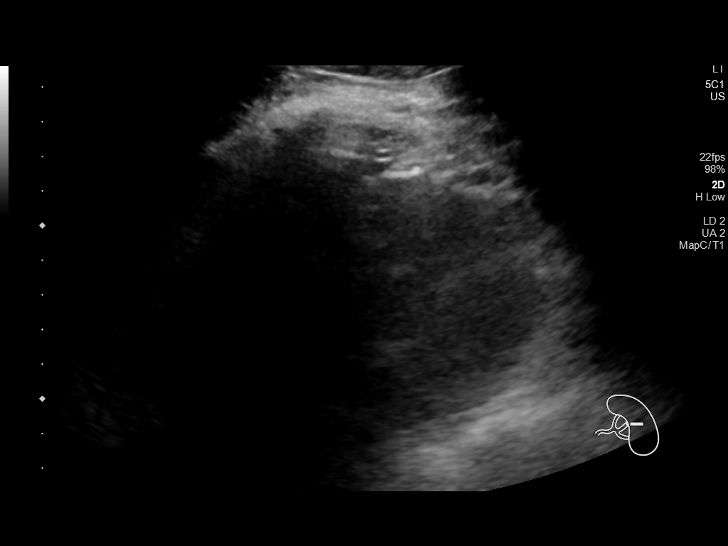
[im 81/108]
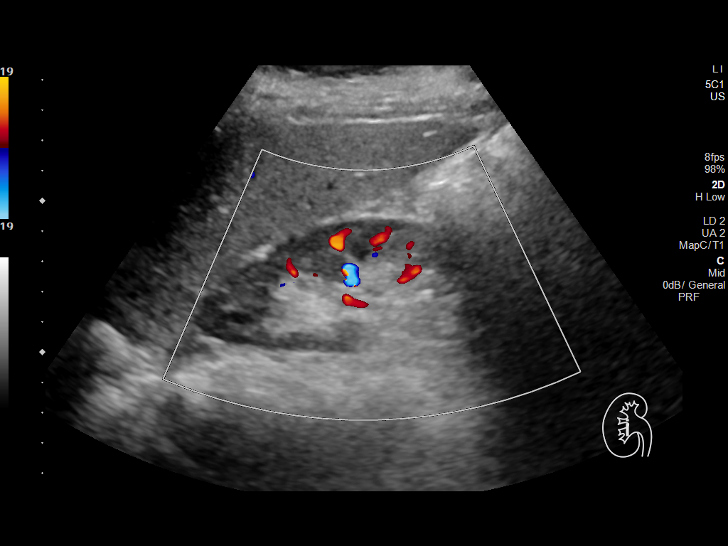
[im 90/108]
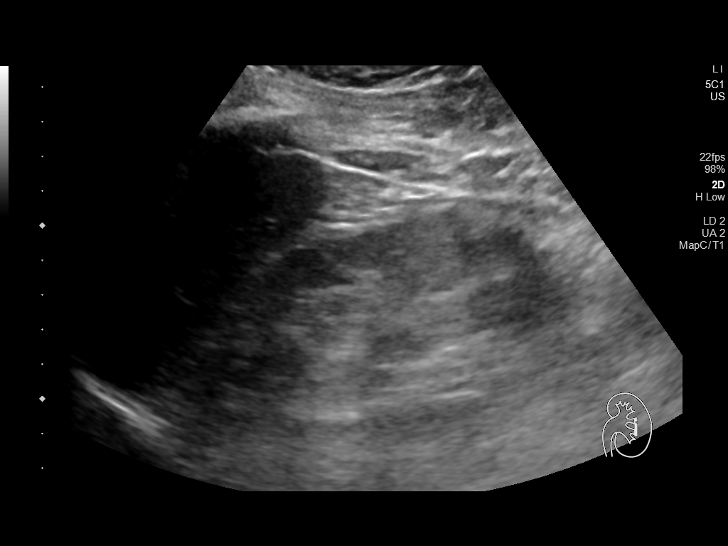
[im 99/108]
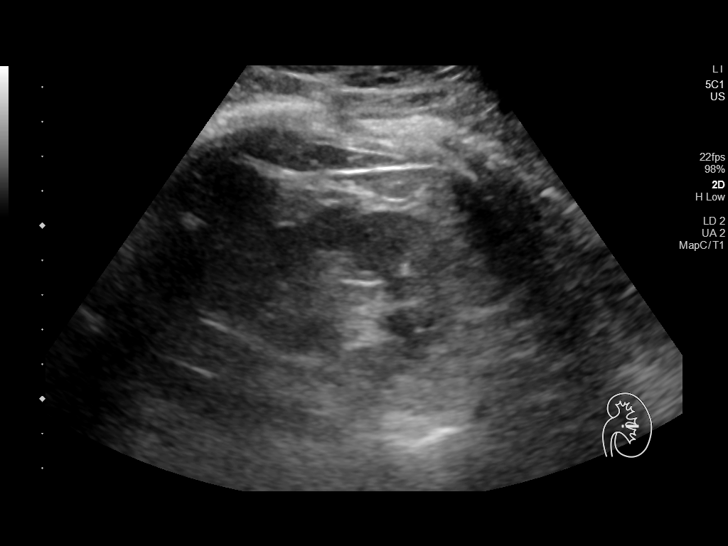
[im 108/108]
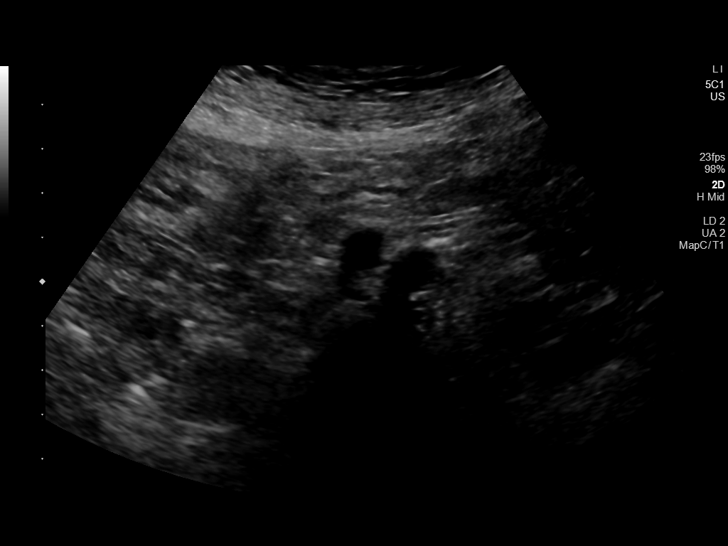

[14 of 25 positions shown; findings below may reference images not displayed]

FINDINGS: Gallbladder: Multiple shadowing, echogenic gallstones are noted. The
largest measures 1.1 cm. The gallbladder wall measures 1.2 mm in
thickness. No sonographic Murphy sign noted by sonographer.

Common bile duct: Diameter: 5.8 mm

Liver: No focal lesion identified. Mild, diffusely increased
echogenicity of the liver parenchyma is noted. Portal vein is patent
on color Doppler imaging with normal direction of blood flow towards
the liver.

IVC: No abnormality visualized.

Pancreas: Limited in visualization secondary to overlying bowel gas.

Spleen: Size and appearance within normal limits.

Right Kidney: Length: 11.0 cm. Echogenicity within normal limits. No
mass or hydronephrosis visualized.

Left Kidney: Length: 11.5 cm. Echogenicity within normal limits. No
mass or hydronephrosis visualized.

Abdominal aorta: No aneurysm visualized (2.3 mm in AP diameter).

Other findings: None.
IMPRESSION: 1. Cholelithiasis.
2. Hepatic steatosis without focal liver lesions.

## 2024-02-13 ENCOUNTER — Other Ambulatory Visit: Payer: Self-pay | Admitting: Family Medicine

## 2024-02-13 DIAGNOSIS — M8000XS Age-related osteoporosis with current pathological fracture, unspecified site, sequela: Secondary | ICD-10-CM

## 2024-02-15 ENCOUNTER — Telehealth: Payer: Self-pay | Admitting: Family Medicine

## 2024-02-15 DIAGNOSIS — I10 Essential (primary) hypertension: Secondary | ICD-10-CM

## 2024-02-15 DIAGNOSIS — M8000XS Age-related osteoporosis with current pathological fracture, unspecified site, sequela: Secondary | ICD-10-CM

## 2024-02-15 DIAGNOSIS — E782 Mixed hyperlipidemia: Secondary | ICD-10-CM

## 2024-02-15 NOTE — Telephone Encounter (Signed)
 Future orders placed

## 2024-02-16 ENCOUNTER — Other Ambulatory Visit: Payer: Self-pay

## 2024-02-16 ENCOUNTER — Telehealth: Payer: Self-pay

## 2024-02-16 DIAGNOSIS — M8000XS Age-related osteoporosis with current pathological fracture, unspecified site, sequela: Secondary | ICD-10-CM

## 2024-02-16 MED ORDER — DENOSUMAB 60 MG/ML ~~LOC~~ SOSY: 60.0000 mg | PREFILLED_SYRINGE | Freq: Once | SUBCUTANEOUS | Status: DC

## 2024-02-16 NOTE — Telephone Encounter (Signed)
 Prolia  VOB initiated via [email or web address]  Next Prolia  inj DUE: 03/28/24

## 2024-02-16 NOTE — Progress Notes (Signed)
 Sent to PA team for benefit verification

## 2024-02-20 ENCOUNTER — Other Ambulatory Visit (HOSPITAL_COMMUNITY): Payer: Self-pay

## 2024-02-20 NOTE — Telephone Encounter (Signed)
 Pt ready for scheduling for PROLIA  on or after : 03/28/24  Option# 1: Buy/Bill (Office supplied medication)  Out-of-pocket cost due at time of clinic visit: $332  Number of injection/visits approved: ---  Primary: [employer] Prolia  co-insurance: 20% Admin fee co-insurance: 0%  Secondary: --- Prolia  co-insurance:  Admin fee co-insurance:   Medical Benefit Details: Date Benefits were checked: 02/17/24 Deductible: NO/ Coinsurance: 20%/ Admin Fee: 0%  Prior Auth: N/A PA# Expiration Date:   # of doses approved: ----------------------------------------------------------------------- Option# 2- Med Obtained from pharmacy:  Pharmacy benefit: Copay $250 (Paid to pharmacy) Admin Fee: 0% (Pay at clinic)  Prior Auth: N/A PA# Expiration Date:   # of doses approved:   If patient wants fill through the pharmacy benefit please send prescription to: [employer], and include estimated need by date in rx notes. Pharmacy will ship medication directly to the office.  Patient NOT eligible for Prolia  Copay Card. Copay Card can make patient's cost as little as $25. Link to apply: [email or web address]://www.amgensupportplus.com/copay  ** This summary of benefits is an estimation of the patient's out-of-pocket cost. Exact cost may very based on individual plan coverage.

## 2024-02-24 ENCOUNTER — Other Ambulatory Visit: Payer: Self-pay | Admitting: Family Medicine

## 2024-02-24 DIAGNOSIS — E782 Mixed hyperlipidemia: Secondary | ICD-10-CM

## 2024-02-28 ENCOUNTER — Telehealth: Payer: Self-pay

## 2024-02-29 NOTE — Telephone Encounter (Signed)
 error

## 2024-03-02 ENCOUNTER — Other Ambulatory Visit: Payer: PPO

## 2024-03-02 DIAGNOSIS — M8000XS Age-related osteoporosis with current pathological fracture, unspecified site, sequela: Secondary | ICD-10-CM

## 2024-03-02 DIAGNOSIS — I10 Essential (primary) hypertension: Secondary | ICD-10-CM

## 2024-03-02 DIAGNOSIS — E782 Mixed hyperlipidemia: Secondary | ICD-10-CM | POA: Diagnosis not present

## 2024-03-02 LAB — CBC WITH DIFFERENTIAL/PLATELET
Basophils Absolute: 0.1 10*3/uL (ref 0.0–0.2)
Basos: 1 %
EOS (ABSOLUTE): 0.1 10*3/uL (ref 0.0–0.4)
Eos: 2 %
Hematocrit: 44.1 % (ref 34.0–46.6)
Hemoglobin: 14.5 g/dL (ref 11.1–15.9)
Immature Grans (Abs): 0 10*3/uL (ref 0.0–0.1)
Immature Granulocytes: 0 %
Lymphocytes Absolute: 1.9 10*3/uL (ref 0.7–3.1)
Lymphs: 35 %
MCH: 29.8 pg (ref 26.6–33.0)
MCHC: 32.9 g/dL (ref 31.5–35.7)
MCV: 91 fL (ref 79–97)
Monocytes Absolute: 0.4 10*3/uL (ref 0.1–0.9)
Monocytes: 8 %
Neutrophils Absolute: 2.8 10*3/uL (ref 1.4–7.0)
Neutrophils: 54 %
Platelets: 205 10*3/uL (ref 150–450)
RBC: 4.86 x10E6/uL (ref 3.77–5.28)
RDW: 13.1 % (ref 11.7–15.4)
WBC: 5.3 10*3/uL (ref 3.4–10.8)

## 2024-03-02 LAB — COMPREHENSIVE METABOLIC PANEL WITH GFR
ALT: 17 IU/L (ref 0–32)
AST: 22 IU/L (ref 0–40)
Albumin: 4.4 g/dL (ref 3.8–4.8)
Alkaline Phosphatase: 40 IU/L — ABNORMAL LOW (ref 44–121)
BUN/Creatinine Ratio: 15 (ref 12–28)
BUN: 12 mg/dL (ref 8–27)
Bilirubin Total: 0.4 mg/dL (ref 0.0–1.2)
CO2: 22 mmol/L (ref 20–29)
Calcium: 9.4 mg/dL (ref 8.7–10.3)
Chloride: 103 mmol/L (ref 96–106)
Creatinine, Ser: 0.8 mg/dL (ref 0.57–1.00)
Globulin, Total: 1.9 g/dL (ref 1.5–4.5)
Glucose: 88 mg/dL (ref 70–99)
Potassium: 4.4 mmol/L (ref 3.5–5.2)
Sodium: 141 mmol/L (ref 134–144)
Total Protein: 6.3 g/dL (ref 6.0–8.5)
eGFR: 76 mL/min/{1.73_m2} (ref >59)

## 2024-03-02 LAB — LIPID PANEL
Chol/HDL Ratio: 3 ratio (ref 0.0–4.4)
Cholesterol, Total: 218 mg/dL — ABNORMAL HIGH (ref 100–199)
HDL: 72 mg/dL (ref >39)
LDL Chol Calc (NIH): 127 mg/dL — ABNORMAL HIGH (ref 0–99)
Triglycerides: 106 mg/dL (ref 0–149)
VLDL Cholesterol Cal: 19 mg/dL (ref 5–40)

## 2024-03-02 LAB — TSH: TSH: 5.4 u[IU]/mL — ABNORMAL HIGH (ref 0.450–4.500)

## 2024-03-07 ENCOUNTER — Ambulatory Visit (INDEPENDENT_AMBULATORY_CARE_PROVIDER_SITE_OTHER): Payer: PPO | Admitting: Family Medicine

## 2024-03-07 ENCOUNTER — Encounter: Payer: Self-pay | Admitting: Family Medicine

## 2024-03-07 ENCOUNTER — Ambulatory Visit: Payer: PPO

## 2024-03-07 VITALS — BP 113/74 | HR 80 | Ht 60.0 in | Wt 141.0 lb

## 2024-03-07 DIAGNOSIS — R7989 Other specified abnormal findings of blood chemistry: Secondary | ICD-10-CM | POA: Diagnosis not present

## 2024-03-07 DIAGNOSIS — I10 Essential (primary) hypertension: Secondary | ICD-10-CM | POA: Diagnosis not present

## 2024-03-07 DIAGNOSIS — K588 Other irritable bowel syndrome: Secondary | ICD-10-CM

## 2024-03-07 DIAGNOSIS — M130 Polyarthritis, unspecified: Secondary | ICD-10-CM | POA: Diagnosis not present

## 2024-03-07 DIAGNOSIS — E782 Mixed hyperlipidemia: Secondary | ICD-10-CM

## 2024-03-07 MED ORDER — HYOSCYAMINE SULFATE 0.125 MG SL SUBL: 0.1250 mg | SUBLINGUAL_TABLET | Freq: Four times a day (QID) | SUBLINGUAL | 3 refills | Status: DC | PRN

## 2024-03-07 MED ORDER — PRAVASTATIN SODIUM 40 MG PO TABS: 40.0000 mg | ORAL_TABLET | Freq: Every day | ORAL | 3 refills | Status: AC

## 2024-03-07 MED ORDER — HYOSCYAMINE SULFATE ER 0.375 MG PO TB12: 0.3750 mg | ORAL_TABLET | Freq: Two times a day (BID) | ORAL | 2 refills | Status: DC

## 2024-03-07 NOTE — Progress Notes (Signed)
 BP 113/74   Pulse 80   Ht 5' (1.524 m)   Wt 141 lb (64 kg)   SpO2 97%   BMI 27.54 kg/m    Subjective:   Patient ID: [name], female    DOB: Nov 24, 1945, 78 y.o.   MRN: [id number]  HPI: [name] is a 78 y.o. female presenting on 03/07/2024 for Medical Management of Chronic Issues, Hyperlipidemia, and Hypertension   HPI Hyperlipidemia Patient is coming in for recheck of his hyperlipidemia. The patient is currently taking pravastatin . They deny any issues with myalgias or history of liver damage from it. They deny any focal numbness or weakness or chest pain.   Hypertension Patient is currently on no medicine, diet controlled, and their blood pressure today is 113/74. Patient denies any lightheadedness or dizziness. Patient denies headaches, blurred vision, chest pains, shortness of breath, or weakness. Denies any side effects from medication and is content with current medication.   Irritable bowel syndrome Patient is coming in today for irritable bowel syndrome recheck.  She currently takes Levsin and Levbid as needed for this.  She does well on those.  Patient polyarthritis and wants to be tested for inflammatory markers.  Patient has elevated TSH again and will do more extensive thyroid  panel testing next time.  Relevant past medical, surgical, family and social history reviewed and updated as indicated. Interim medical history since our last visit reviewed. Allergies and medications reviewed and updated.  Review of Systems  Constitutional:  Negative for chills and fever.  HENT:  Negative for congestion, ear discharge and ear pain.   Eyes:  Negative for redness and visual disturbance.  Respiratory:  Negative for chest tightness and shortness of breath.   Cardiovascular:  Negative for chest pain and leg swelling.  Genitourinary:  Negative for difficulty urinating and dysuria.  Musculoskeletal:  Positive for arthralgias. Negative for back pain and gait problem.   Skin:  Negative for rash.  Neurological:  Negative for dizziness, light-headedness and headaches.  Psychiatric/Behavioral:  Negative for agitation and behavioral problems.   All other systems reviewed and are negative.   Per HPI unless specifically indicated above   Allergies as of 03/07/2024       Reactions   Nsaids Hives   Poison Ivy Extract Rash   Mango Flavor [flavoring Agent (non-screening)] Hives   Neomycin-polymyxin B Gu Other (See Comments)   unknown   Teriparatide Hives        Medication List        Accurate as of Mar 07, 2024  2:37 PM. If you have any questions, ask your nurse or doctor.          acetaminophen  650 MG CR tablet Commonly known as: TYLENOL  Take 650 mg by mouth every 8 (eight) hours as needed for pain.   Calcium  1000 + D 1000-800 MG-UNIT Tabs Generic drug: Calcium  Carb-Cholecalciferol  Take 600 mg by mouth daily.   cholecalciferol  25 MCG (1000 UNIT) tablet Commonly known as: VITAMIN D3 Take 1,000 Units by mouth daily.   Coenzyme Q10 200 MG capsule Take 200 mg by mouth daily.   doxycycline  100 MG tablet Commonly known as: VIBRA -TABS Take 1 tablet (100 mg total) by mouth 2 (two) times daily.   EYE DROPS ALLERGY RELIEF OP Apply 1 drop to eye daily as needed.   Fish Oil 1000 MG Caps Take 1,200 mg by mouth in the morning and at bedtime.   hyoscyamine  0.375 MG 12 hr tablet Commonly known as: LEVBID  Take 1 tablet (0.375 mg total) by mouth 2 (two) times daily. What changed: Another medication with the same name was changed. Make sure you understand how and when to take each. Changed by: [name]   hyoscyamine  0.125 MG SL tablet Commonly known as: LEVSIN SL Take 1 tablet (0.125 mg total) by mouth every 6 (six) hours as needed for cramping. What changed: See the new instructions. Changed by: [name]   Magnesium  500 MG Caps Take 500 mg by mouth daily.   multivitamin capsule Take 1 capsule by mouth daily.    NASACORT  ALLERGY 24HR NA Place 1 spray into the nose daily.   pravastatin  40 MG tablet Commonly known as: PRAVACHOL  Take 1 tablet (40 mg total) by mouth at bedtime. What changed:  medication strength how much to take Changed by: [name]   Prolia  60 MG/ML Sosy injection Generic drug: denosumab  HCP to give 1 injection (60mg ) subcutaneously once every 6 months.   QC Tumeric Complex 500 MG Caps Generic drug: Turmeric Take 500 mg by mouth daily.         Objective:   BP 113/74   Pulse 80   Ht 5' (1.524 m)   Wt 141 lb (64 kg)   SpO2 97%   BMI 27.54 kg/m   Wt Readings from Last 3 Encounters:  03/07/24 141 lb (64 kg)  09/05/23 141 lb (64 kg)  06/15/23 140 lb (63.5 kg)    Physical Exam Vitals and nursing note reviewed.  Constitutional:      General: She is not in acute distress.    Appearance: She is well-developed. She is not diaphoretic.  Eyes:     Conjunctiva/sclera: Conjunctivae normal.  Cardiovascular:     Rate and Rhythm: Normal rate and regular rhythm.     Heart sounds: Normal heart sounds. No murmur heard. Pulmonary:     Effort: Pulmonary effort is normal. No respiratory distress.     Breath sounds: Normal breath sounds. No wheezing.  Musculoskeletal:        General: No swelling.  Skin:    General: Skin is warm and dry.     Findings: No rash.  Neurological:     Mental Status: She is alert and oriented to person, place, and time.     Coordination: Coordination normal.  Psychiatric:        Behavior: Behavior normal.     Results for orders placed or performed in visit on 03/02/24  Lipid panel   Collection Time: 03/02/24  8:36 AM  Result Value Ref Range   Cholesterol, Total 218 (H) 100 - 199 mg/dL   Triglycerides 213 0 - 149 mg/dL   HDL 72 >08 mg/dL   VLDL Cholesterol Cal 19 5 - 40 mg/dL   LDL Chol Calc (NIH) 657 (H) 0 - 99 mg/dL   Chol/HDL Ratio 3.0 0.0 - 4.4 ratio  TSH   Collection Time: 03/02/24  8:36 AM  Result Value Ref Range    TSH 5.400 (H) 0.450 - 4.500 uIU/mL  Comprehensive metabolic panel with GFR   Collection Time: 03/02/24  8:36 AM  Result Value Ref Range   Glucose 88 70 - 99 mg/dL   BUN 12 8 - 27 mg/dL   Creatinine, Ser 8.46 0.57 - 1.00 mg/dL   eGFR 76 >96 EX/BMW/4.13   BUN/Creatinine Ratio 15 12 - 28   Sodium 141 134 - 144 mmol/L   Potassium 4.4 3.5 - 5.2 mmol/L   Chloride 103 96 -  106 mmol/L   CO2 22 20 - 29 mmol/L   Calcium  9.4 8.7 - 10.3 mg/dL   Total Protein 6.3 6.0 - 8.5 g/dL   Albumin 4.4 3.8 - 4.8 g/dL   Globulin, Total 1.9 1.5 - 4.5 g/dL   Bilirubin Total 0.4 0.0 - 1.2 mg/dL   Alkaline Phosphatase 40 (L) 44 - 121 IU/L   AST 22 0 - 40 IU/L   ALT 17 0 - 32 IU/L  CBC with Differential/Platelet   Collection Time: 03/02/24  8:36 AM  Result Value Ref Range   WBC 5.3 3.4 - 10.8 x10E3/uL   RBC 4.86 3.77 - 5.28 x10E6/uL   Hemoglobin 14.5 11.1 - 15.9 g/dL   Hematocrit 16.1 09.6 - 46.6 %   MCV 91 79 - 97 fL   MCH 29.8 26.6 - 33.0 pg   MCHC 32.9 31.5 - 35.7 g/dL   RDW 04.5 40.9 - 81.1 %   Platelets 205 150 - 450 x10E3/uL   Neutrophils 54 Not Estab. %   Lymphs 35 Not Estab. %   Monocytes 8 Not Estab. %   Eos 2 Not Estab. %   Basos 1 Not Estab. %   Neutrophils Absolute 2.8 1.4 - 7.0 x10E3/uL   Lymphocytes Absolute 1.9 0.7 - 3.1 x10E3/uL   Monocytes Absolute 0.4 0.1 - 0.9 x10E3/uL   EOS (ABSOLUTE) 0.1 0.0 - 0.4 x10E3/uL   Basophils Absolute 0.1 0.0 - 0.2 x10E3/uL   Immature Granulocytes 0 Not Estab. %   Immature Grans (Abs) 0.0 0.0 - 0.1 x10E3/uL    Assessment & Plan:   Problem List Items Addressed This Visit       Cardiovascular and Mediastinum   Hypertension   Relevant Medications   pravastatin  (PRAVACHOL ) 40 MG tablet     Digestive   Irritable bowel syndrome   Relevant Medications   hyoscyamine  (LEVBID) 0.375 MG 12 hr tablet   hyoscyamine  (LEVSIN SL) 0.125 MG SL tablet     Other   HLD (hyperlipidemia) - Primary   Relevant Medications   pravastatin  (PRAVACHOL ) 40 MG  tablet   Other Visit Diagnoses       Polyarthritis         Elevated TSH           Cholesterol still elevated, will increase pravastatin  to 40 mg daily.  Will test for chronic inflammatory or polyarthritis and do some inflammatory markers.  Thyroid  panel was borderline on the TSH, will do a more extensive thyroid  panel the next time she comes in. Follow up plan: Return in about 3 months (around 06/07/2024), or if symptoms worsen or fail to improve, for Hypertension and hyperlipidemia and thyroid  recheck.  Counseling provided for all of the vaccine components No orders of the defined types were placed in this encounter.   Jolyne Needs, MD [facility] 03/07/2024, 2:37 PM

## 2024-03-12 ENCOUNTER — Telehealth: Payer: Self-pay | Admitting: Family Medicine

## 2024-03-12 NOTE — Telephone Encounter (Signed)
 Copied from CRM [id number]. Topic: General - Other >> Mar 12, 2024  1:54 PM Essie A wrote: Reason for CRM: Margretta Shi from [facility] to let the office know that a delivery for Prolia  for the patient is scheduled for June 5.

## 2024-03-12 NOTE — Telephone Encounter (Signed)
 Noted

## 2024-03-16 NOTE — Telephone Encounter (Signed)
 Called patient - [email or web address]. Please forward return call to me.

## 2024-03-21 DIAGNOSIS — D1801 Hemangioma of skin and subcutaneous tissue: Secondary | ICD-10-CM | POA: Diagnosis not present

## 2024-03-21 DIAGNOSIS — D225 Melanocytic nevi of trunk: Secondary | ICD-10-CM | POA: Diagnosis not present

## 2024-03-21 DIAGNOSIS — D2262 Melanocytic nevi of left upper limb, including shoulder: Secondary | ICD-10-CM | POA: Diagnosis not present

## 2024-03-21 DIAGNOSIS — L812 Freckles: Secondary | ICD-10-CM | POA: Diagnosis not present

## 2024-03-21 DIAGNOSIS — D2371 Other benign neoplasm of skin of right lower limb, including hip: Secondary | ICD-10-CM | POA: Diagnosis not present

## 2024-03-21 DIAGNOSIS — L821 Other seborrheic keratosis: Secondary | ICD-10-CM | POA: Diagnosis not present

## 2024-03-21 DIAGNOSIS — D2372 Other benign neoplasm of skin of left lower limb, including hip: Secondary | ICD-10-CM | POA: Diagnosis not present

## 2024-03-21 DIAGNOSIS — D485 Neoplasm of uncertain behavior of skin: Secondary | ICD-10-CM | POA: Diagnosis not present

## 2024-04-05 ENCOUNTER — Ambulatory Visit (INDEPENDENT_AMBULATORY_CARE_PROVIDER_SITE_OTHER)

## 2024-04-05 DIAGNOSIS — M8000XS Age-related osteoporosis with current pathological fracture, unspecified site, sequela: Secondary | ICD-10-CM | POA: Diagnosis not present

## 2024-04-05 MED ORDER — DENOSUMAB 60 MG/ML ~~LOC~~ SOSY
60.0000 mg | PREFILLED_SYRINGE | Freq: Once | SUBCUTANEOUS | Status: AC
  Administered 2024-04-05: 60 mg via SUBCUTANEOUS

## 2024-04-05 NOTE — Progress Notes (Signed)
 Prolia  injection given in left arm and tolerated well.

## 2024-06-01 ENCOUNTER — Other Ambulatory Visit

## 2024-06-01 DIAGNOSIS — R7989 Other specified abnormal findings of blood chemistry: Secondary | ICD-10-CM

## 2024-06-01 DIAGNOSIS — E782 Mixed hyperlipidemia: Secondary | ICD-10-CM

## 2024-06-01 DIAGNOSIS — M130 Polyarthritis, unspecified: Secondary | ICD-10-CM | POA: Diagnosis not present

## 2024-06-02 LAB — SEDIMENTATION RATE: Sed Rate: 2 mm/h (ref 0–40)

## 2024-06-02 LAB — CMP14+EGFR
ALT: 18 IU/L (ref 0–32)
AST: 22 IU/L (ref 0–40)
Albumin: 4.4 g/dL (ref 3.8–4.8)
Alkaline Phosphatase: 38 IU/L — ABNORMAL LOW (ref 44–121)
BUN/Creatinine Ratio: 23 (ref 12–28)
BUN: 18 mg/dL (ref 8–27)
Bilirubin Total: 0.5 mg/dL (ref 0.0–1.2)
CO2: 22 mmol/L (ref 20–29)
Calcium: 9.7 mg/dL (ref 8.7–10.3)
Chloride: 101 mmol/L (ref 96–106)
Creatinine, Ser: 0.8 mg/dL (ref 0.57–1.00)
Globulin, Total: 2 g/dL (ref 1.5–4.5)
Glucose: 84 mg/dL (ref 70–99)
Potassium: 4.6 mmol/L (ref 3.5–5.2)
Sodium: 139 mmol/L (ref 134–144)
Total Protein: 6.4 g/dL (ref 6.0–8.5)
eGFR: 76 mL/min/1.73 (ref >59)

## 2024-06-02 LAB — C-REACTIVE PROTEIN: CRP: 1 mg/L (ref 0–10)

## 2024-06-02 LAB — THYROID PANEL WITH TSH
Free Thyroxine Index: 2.1 (ref 1.2–4.9)
T3 Uptake Ratio: 26 % (ref 24–39)
T4, Total: 7.9 ug/dL (ref 4.5–12.0)
TSH: 4.63 u[IU]/mL — ABNORMAL HIGH (ref 0.450–4.500)

## 2024-06-02 LAB — THYROID PEROXIDASE ANTIBODY: Thyroperoxidase Ab SerPl-aCnc: 11 [IU]/mL (ref 0–34)

## 2024-06-07 ENCOUNTER — Ambulatory Visit: Admitting: Family Medicine

## 2024-06-07 ENCOUNTER — Ambulatory Visit: Payer: Self-pay | Admitting: Family Medicine

## 2024-06-08 ENCOUNTER — Telehealth: Payer: Self-pay | Admitting: Family Medicine

## 2024-06-08 MED ORDER — LEVOTHYROXINE SODIUM 25 MCG PO TABS: 25.0000 ug | ORAL_TABLET | Freq: Every day | ORAL | 3 refills | Status: AC

## 2024-06-08 NOTE — Telephone Encounter (Signed)
 Copied from CRM [id number]. Topic: Clinical - Prescription Issue >> Jun 08, 2024 11:52 AM Willma SAUNDERS wrote: Reason for CRM: Notes from yesterday 08/28 in patients chart from Dr Dettinger state to prescribe  levothyroxine  25 mcg for patient. Calling to confirm the prescription has been requested and sent to: [facility] [address] [place] - [address] [place] [address] Phone: [phone] Fax: [phone]  Patient can be reached at [phone]

## 2024-06-08 NOTE — Telephone Encounter (Signed)
 Per labs it was suppose to be sent I have sent to the [facility] called to let patient know and there was no answer left VM

## 2024-06-12 ENCOUNTER — Other Ambulatory Visit: Payer: Self-pay

## 2024-06-12 DIAGNOSIS — R7989 Other specified abnormal findings of blood chemistry: Secondary | ICD-10-CM

## 2024-06-12 NOTE — Telephone Encounter (Signed)
 advised patient of the medication that was called into her pharmacy for the evothyroxine 25 mcg

## 2024-06-15 ENCOUNTER — Ambulatory Visit: Payer: PPO

## 2024-06-15 VITALS — BP 113/74 | HR 80 | Ht 62.0 in | Wt 141.0 lb

## 2024-06-15 DIAGNOSIS — Z0000 Encounter for general adult medical examination without abnormal findings: Secondary | ICD-10-CM

## 2024-06-15 DIAGNOSIS — Z1211 Encounter for screening for malignant neoplasm of colon: Secondary | ICD-10-CM

## 2024-06-15 NOTE — Patient Instructions (Signed)
 [name],  Thank you for taking the time for your Medicare Wellness Visit. I appreciate your continued commitment to your health goals. Please review the care plan we discussed, and feel free to reach out if I can assist you further.  Medicare recommends these wellness visits once per year to help you and your care team stay ahead of potential health issues. These visits are designed to focus on prevention, allowing your provider to concentrate on managing your acute and chronic conditions during your regular appointments.  Please note that Annual Wellness Visits do not include a physical exam. Some assessments may be limited, especially if the visit was conducted virtually. If needed, we may recommend a separate in-person follow-up with your provider.  Ongoing Care Seeing your primary care provider every 3 to 6 months helps us  monitor your health and provide consistent, personalized care.   Referrals If a referral was made during today's visit and you haven't received any updates within two weeks, please contact the referred provider directly to check on the status.  Recommended Screenings:  Health Maintenance  Topic Date Due   Colon Cancer Screening  03/18/2023   Flu Shot  05/11/2024   COVID-19 Vaccine (4 - 2025-26 season) 06/11/2024   Medicare Annual Wellness Visit  06/14/2024   Zoster (Shingles) Vaccine (1 of 2) 04/07/2025*   Mammogram  12/29/2024   DEXA scan (bone density measurement)  08/07/2025   DTaP/Tdap/Td vaccine (4 - Td or Tdap) 09/04/2033   Pneumococcal Vaccine for age over 20  Completed   Hepatitis C Screening  Completed   HPV Vaccine  Aged Out   Meningitis B Vaccine  Aged Out  *Topic was postponed. The date shown is not the original due date.       06/15/2024    9:15 AM  Advanced Directives  Does Patient Have a Medical Advance Directive? No   Advance Care Planning is important because it: Ensures you receive medical care that aligns with your values, goals, and  preferences. Provides guidance to your family and loved ones, reducing the emotional burden of decision-making during critical moments.  Vision: Annual vision screenings are recommended for early detection of glaucoma, cataracts, and diabetic retinopathy. These exams can also reveal signs of chronic conditions such as diabetes and high blood pressure.  Dental: Annual dental screenings help detect early signs of oral cancer, gum disease, and other conditions linked to overall health, including heart disease and diabetes.  Please see the attached documents for additional preventive care recommendations.
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
## 2024-06-20 ENCOUNTER — Ambulatory Visit (INDEPENDENT_AMBULATORY_CARE_PROVIDER_SITE_OTHER): Admitting: Family Medicine

## 2024-06-20 ENCOUNTER — Encounter: Payer: Self-pay | Admitting: Family Medicine

## 2024-06-20 VITALS — BP 129/75 | HR 77 | Ht 62.0 in | Wt 141.0 lb

## 2024-06-20 DIAGNOSIS — I10 Essential (primary) hypertension: Secondary | ICD-10-CM | POA: Diagnosis not present

## 2024-06-20 DIAGNOSIS — Z131 Encounter for screening for diabetes mellitus: Secondary | ICD-10-CM | POA: Diagnosis not present

## 2024-06-20 DIAGNOSIS — E039 Hypothyroidism, unspecified: Secondary | ICD-10-CM | POA: Diagnosis not present

## 2024-06-20 DIAGNOSIS — M8000XS Age-related osteoporosis with current pathological fracture, unspecified site, sequela: Secondary | ICD-10-CM

## 2024-06-20 DIAGNOSIS — E782 Mixed hyperlipidemia: Secondary | ICD-10-CM

## 2024-06-20 LAB — BAYER DCA HB A1C WAIVED: HB A1C (BAYER DCA - WAIVED): 5.6 % (ref 4.8–5.6)

## 2024-06-20 NOTE — Progress Notes (Signed)
 BP 129/75   Pulse 77   Ht 5' 2 (1.575 m)   Wt 141 lb (64 kg)   SpO2 90%   BMI 25.79 kg/m    Subjective:   Patient ID: [name], female    DOB: 03/12/1946, 78 y.o.   MRN: [id number]  HPI: [name] is a 78 y.o. female presenting on 06/20/2024 for Medical Management of Chronic Issues, Hyperlipidemia, Hypertension, and Hypothyroidism   Discussed the use of AI scribe software for clinical note transcription with the patient, who gave verbal consent to proceed.  History of Present Illness   [name] is a 78 year old female with hypothyroidism who presents for follow-up on thyroid  medication and cholesterol management.  She has been experiencing fluctuations in blood sugar levels since starting thyroid  medication approximately a week ago. She is prescribed 50 mcg of thyroid  medication but has been using 25 mcg tablets due to availability issues at her pharmacy. The pharmacy has now informed her that the 25 mcg tablets are available.  She manages her cholesterol with pravastatin  and fish oils. She is due for a recheck of her cholesterol levels, but the results were not available during this visit.  She is on treatment for bone health, taking calcium , vitamin D , and Prolia , and reports doing well on this regimen.  She is considering changing her pharmacy due to issues with medication availability and has discussed switching to [facility].  No recent falls, lightheadedness, or concerning symptoms. She has a history of gallbladder removal and manages digestive issues by avoiding certain foods like iceberg lettuce.          Relevant past medical, surgical, family and social history reviewed and updated as indicated. Interim medical history since our last visit reviewed. Allergies and medications reviewed and updated.  Review of Systems  Constitutional:  Negative for chills and fever.  Eyes:  Negative for redness and visual disturbance.  Respiratory:  Negative  for chest tightness and shortness of breath.   Cardiovascular:  Negative for chest pain and leg swelling.  Musculoskeletal:  Negative for back pain and gait problem.  Skin:  Negative for rash.  Neurological:  Negative for dizziness, light-headedness and headaches.  Psychiatric/Behavioral:  Negative for agitation and behavioral problems.   All other systems reviewed and are negative.   Per HPI unless specifically indicated above   Allergies as of 06/20/2024       Reactions   Nsaids Hives   Poison Ivy Extract Rash   Mango Flavor [flavoring Agent (non-screening)] Hives   Neomycin-polymyxin B Gu Other (See Comments)   unknown   Teriparatide Hives        Medication List        Accurate as of June 20, 2024  2:13 PM. If you have any questions, ask your nurse or doctor.          acetaminophen  650 MG CR tablet Commonly known as: TYLENOL  Take 650 mg by mouth every 8 (eight) hours as needed for pain.   Calcium  1000 + D 1000-800 MG-UNIT Tabs Generic drug: Calcium  Carb-Cholecalciferol  Take 600 mg by mouth daily.   cholecalciferol  25 MCG (1000 UNIT) tablet Commonly known as: VITAMIN D3 Take 1,000 Units by mouth daily.   Coenzyme Q10 200 MG capsule Take 200 mg by mouth daily.   doxycycline  100 MG tablet Commonly known as: VIBRA -TABS Take 1 tablet (100 mg total) by mouth 2 (two) times daily.   EYE DROPS ALLERGY RELIEF OP Apply 1  drop to eye daily as needed.   Fish Oil 1000 MG Caps Take 1,200 mg by mouth in the morning and at bedtime.   hyoscyamine  0.375 MG 12 hr tablet Commonly known as: LEVBID  Take 1 tablet (0.375 mg total) by mouth 2 (two) times daily.   hyoscyamine  0.125 MG SL tablet Commonly known as: LEVSIN  SL Take 1 tablet (0.125 mg total) by mouth every 6 (six) hours as needed for cramping.   levothyroxine  25 MCG tablet Commonly known as: SYNTHROID  Take 1 tablet (25 mcg total) by mouth daily.   Magnesium  500 MG Caps Take 500 mg by mouth daily.    multivitamin capsule Take 1 capsule by mouth daily.   NASACORT  ALLERGY 24HR NA Place 1 spray into the nose daily.   pravastatin  40 MG tablet Commonly known as: PRAVACHOL  Take 1 tablet (40 mg total) by mouth at bedtime.   Prolia  60 MG/ML Sosy injection Generic drug: denosumab  HCP to give 1 injection (60mg ) subcutaneously once every 6 months.   QC Tumeric Complex 500 MG Caps Generic drug: Turmeric Take 500 mg by mouth daily.         Objective:   BP 129/75   Pulse 77   Ht 5' 2 (1.575 m)   Wt 141 lb (64 kg)   SpO2 90%   BMI 25.79 kg/m   Wt Readings from Last 3 Encounters:  06/20/24 141 lb (64 kg)  06/15/24 141 lb (64 kg)  03/07/24 141 lb (64 kg)    Physical Exam Physical Exam   NECK: Thyroid  without masses. CHEST: Lungs clear to auscultation. CARDIOVASCULAR: Heart sounds normal.         Assessment & Plan:   Problem List Items Addressed This Visit       Cardiovascular and Mediastinum   Hypertension     Endocrine   Hypothyroid   Relevant Orders   Thyroid  Panel With TSH     Musculoskeletal and Integument   Osteoporosis     Other   HLD (hyperlipidemia) - Primary   Relevant Orders   Lipid panel       Hypothyroidism Managed with levothyroxine  25 mcg daily, breaking 50 mcg tablets due to supply issues. - Continue levothyroxine  25 mcg daily. - Recheck thyroid  function tests in 2-3 months. - Make a lab appointment for thyroid  function tests.  Mixed hyperlipidemia Managed with pravastatin , fish oils, and newly added Zetia. Considering pharmacy change due to supply issues. - Continue pravastatin  and fish oils. - Start Zetia as prescribed. - Consider changing pharmacy to [facility] or [facility].  Osteoporosis Managed with calcium , vitamin D , and Prolia . Possible brand name change for Prolia  due to insurance. - Continue calcium , vitamin D , and Prolia . - Monitor for any changes in medication brand name due to  insurance.  Post-cholecystectomy dietary management Advised to avoid certain foods and focus on darker greens. - Encourage adherence to a gallbladder-friendly diet. - Avoid iceberg lettuce and focus on darker greens like romaine and spinach.          Follow up plan: Return in about 3 months (around 09/19/2024), or if symptoms worsen or fail to improve, for Thyroid  recheck.  Counseling provided for all of the vaccine components Orders Placed This Encounter  Procedures   Thyroid  Panel With TSH   Lipid panel    Fonda Levins, MD [facility] 06/20/2024, 2:13 PM

## 2024-06-20 NOTE — Addendum Note (Signed)
 Addended by: MARYANNE CHEW on: 06/20/2024 02:17 PM   Modules accepted: Orders

## 2024-07-11 DIAGNOSIS — M00152 Pneumococcal arthritis, left hip: Secondary | ICD-10-CM | POA: Diagnosis not present

## 2024-07-11 DIAGNOSIS — M25552 Pain in left hip: Secondary | ICD-10-CM | POA: Diagnosis not present

## 2024-08-09 DIAGNOSIS — M1612 Unilateral primary osteoarthritis, left hip: Secondary | ICD-10-CM | POA: Diagnosis not present

## 2024-08-29 ENCOUNTER — Ambulatory Visit: Admitting: Physician Assistant

## 2024-09-05 ENCOUNTER — Telehealth: Payer: Self-pay

## 2024-09-05 NOTE — Telephone Encounter (Signed)
 Prolia  VOB initiated via [email or web address]  Next Prolia  inj DUE: 10/05/24

## 2024-09-10 ENCOUNTER — Other Ambulatory Visit (HOSPITAL_COMMUNITY): Payer: Self-pay

## 2024-09-10 NOTE — Telephone Encounter (Signed)
 Pt ready for scheduling for PROLIA  on or after : 10/05/24  Option# 1: Buy/Bill (Office supplied medication)  Out-of-pocket cost due at time of clinic visit: $332  Number of injection/visits approved: ---  Primary: [employer] Prolia  co-insurance: 20% Admin fee co-insurance: 0%  Secondary: --- Prolia  co-insurance:  Admin fee co-insurance:   Medical Benefit Details: Date Benefits were checked: 09/10/24 Deductible: NO/ Coinsurance: 20%/ Admin Fee: 0%  Prior Auth: N/A PA# Expiration Date:   # of doses approved: ----------------------------------------------------------------------- Option# 2- Med Obtained from pharmacy: Prolia  is no longer preferred for pharmacy benefit. Jubbonti is now preferred. PRICING IS FOR JUBBONTI  Pharmacy benefit: Copay $250 (Paid to pharmacy) Admin Fee: 0% (Pay at clinic)  Prior Auth: N/A PA# Expiration Date:   # of doses approved:   If patient wants fill through the pharmacy benefit please send prescription to: [facility], and include estimated need by date in rx notes. Pharmacy will ship medication directly to the office.  Patient NOT eligible for Prolia  Copay Card. Copay Card can make patient's cost as little as $25. Link to apply: [email or web address]://www.amgensupportplus.com/copay  ** This summary of benefits is an estimation of the patient's out-of-pocket cost. Exact cost may very based on individual plan coverage.

## 2024-09-14 ENCOUNTER — Telehealth: Payer: Self-pay

## 2024-09-14 DIAGNOSIS — M8000XS Age-related osteoporosis with current pathological fracture, unspecified site, sequela: Secondary | ICD-10-CM

## 2024-09-14 MED ORDER — DENOSUMAB 60 MG/ML ~~LOC~~ SOSY: 60.0000 mg | PREFILLED_SYRINGE | Freq: Once | SUBCUTANEOUS | Status: DC

## 2024-09-14 NOTE — Telephone Encounter (Signed)
 Prolia  sent for benefit verification

## 2024-09-19 ENCOUNTER — Other Ambulatory Visit: Payer: Self-pay

## 2024-09-19 ENCOUNTER — Ambulatory Visit: Payer: Self-pay | Admitting: Family Medicine

## 2024-09-19 ENCOUNTER — Encounter (INDEPENDENT_AMBULATORY_CARE_PROVIDER_SITE_OTHER): Payer: Self-pay

## 2024-09-19 ENCOUNTER — Encounter: Payer: Self-pay | Admitting: Family Medicine

## 2024-09-19 ENCOUNTER — Telehealth: Payer: Self-pay | Admitting: Family Medicine

## 2024-09-19 VITALS — BP 129/76 | HR 76 | Ht 62.0 in | Wt 140.0 lb

## 2024-09-19 DIAGNOSIS — I10 Essential (primary) hypertension: Secondary | ICD-10-CM | POA: Diagnosis not present

## 2024-09-19 DIAGNOSIS — E039 Hypothyroidism, unspecified: Secondary | ICD-10-CM

## 2024-09-19 DIAGNOSIS — E782 Mixed hyperlipidemia: Secondary | ICD-10-CM | POA: Diagnosis not present

## 2024-09-19 DIAGNOSIS — R7303 Prediabetes: Secondary | ICD-10-CM

## 2024-09-19 LAB — BAYER DCA HB A1C WAIVED: HB A1C (BAYER DCA - WAIVED): 5.5 % (ref 4.8–5.6)

## 2024-09-19 MED ORDER — DENOSUMAB 60 MG/ML ~~LOC~~ SOSY: 60.0000 mg | PREFILLED_SYRINGE | SUBCUTANEOUS | 0 refills | Status: DC

## 2024-09-19 NOTE — Progress Notes (Signed)
 BP 129/76   Pulse 76   Ht 5' 2 (1.575 m)   Wt 140 lb (63.5 kg)   SpO2 98%   BMI 25.61 kg/m    Subjective:   Patient ID: [name], female    DOB: 1946/03/06, 78 y.o.   MRN: [id number]  HPI: [name] is a 78 y.o. female presenting on 09/19/2024 for Medical Management of Chronic Issues, Hyperlipidemia, and Hypertension   Discussed the use of AI scribe software for clinical note transcription with the patient, who gave verbal consent to proceed.  History of Present Illness   [name] is a 78 year old female with hypothyroidism who presents for a recheck of her thyroid  condition.  Thyroid  dysfunction - Diagnosed with hypothyroidism - Started on thyroid  medication with initial improvement in symptoms, though uncertain if benefit is sustained - Concerned about iodine deficiency and its impact on thyroid  health - Uses sea salt, which lacks iodine, and is interested in checking iodine levels  Supplement and medication management - Takes prescribed medications including a cholesterol-lowering agent, fish oil, CoQ10, calcium , vitamin D , and turmeric - Calcium  supplement taken once daily - Vitamin D  taken as part of calcium  supplement and multivitamin; has stopped taking it at night and is considering taking it in the morning instead - Considering consulting with a pharmacist to review medication regimen for potential interactions  Nutritional intake - Ensures adequate protein intake and is not a vegetarian          Relevant past medical, surgical, family and social history reviewed and updated as indicated. Interim medical history since our last visit reviewed. Allergies and medications reviewed and updated.  Review of Systems  Constitutional:  Negative for chills and fever.  Eyes:  Negative for redness and visual disturbance.  Respiratory:  Negative for chest tightness and shortness of breath.   Cardiovascular:  Negative for chest pain and leg swelling.   Musculoskeletal:  Negative for back pain and gait problem.  Skin:  Negative for rash.  Neurological:  Negative for dizziness, light-headedness and headaches.  Psychiatric/Behavioral:  Negative for agitation and behavioral problems.   All other systems reviewed and are negative.   Per HPI unless specifically indicated above   Allergies as of 09/19/2024       Reactions   Nsaids Hives   Poison Ivy Extract Rash   Mango Flavor [flavoring Agent (non-screening)] Hives   Neomycin-polymyxin B Gu Other (See Comments)   unknown   Teriparatide Hives        Medication List        Accurate as of September 19, 2024  2:11 PM. If you have any questions, ask your nurse or doctor.          acetaminophen  650 MG CR tablet Commonly known as: TYLENOL  Take 650 mg by mouth every 8 (eight) hours as needed for pain.   Calcium  1000 + D 1000-800 MG-UNIT Tabs Generic drug: Calcium  Carb-Cholecalciferol  Take 600 mg by mouth daily.   cholecalciferol  25 MCG (1000 UNIT) tablet Commonly known as: VITAMIN D3 Take 1,000 Units by mouth daily.   Coenzyme Q10 200 MG capsule Take 200 mg by mouth daily.   denosumab  60 MG/ML Sosy injection Commonly known as: PROLIA  Inject 60 mg into the skin every 6 (six) months.   docusate sodium  50 MG capsule Commonly known as: COLACE Take 50 mg by mouth 2 (two) times daily.   doxycycline  100 MG tablet Commonly known as: VIBRA -TABS Take 1 tablet (100  mg total) by mouth 2 (two) times daily.   EYE DROPS ALLERGY RELIEF OP Apply 1 drop to eye daily as needed.   Fish Oil 1000 MG Caps Take 1,200 mg by mouth in the morning and at bedtime.   hyoscyamine  0.375 MG 12 hr tablet Commonly known as: LEVBID  Take 1 tablet (0.375 mg total) by mouth 2 (two) times daily.   hyoscyamine  0.125 MG SL tablet Commonly known as: LEVSIN  SL Take 1 tablet (0.125 mg total) by mouth every 6 (six) hours as needed for cramping.   levothyroxine  25 MCG tablet Commonly known as:  SYNTHROID  Take 1 tablet (25 mcg total) by mouth daily.   Magnesium  500 MG Caps Take 500 mg by mouth daily.   multivitamin capsule Take 1 capsule by mouth daily.   NASACORT  ALLERGY 24HR NA Place 1 spray into the nose daily.   polyethylene glycol powder 17 GM/SCOOP powder Commonly known as: GLYCOLAX /MIRALAX  Take 17 g by mouth as needed. Dissolve 1 capful (17g) in 4-8 ounces of liquid and take by mouth daily.   pravastatin  40 MG tablet Commonly known as: PRAVACHOL  Take 1 tablet (40 mg total) by mouth at bedtime.   Prevagen 10 MG Caps Generic drug: Apoaequorin Take 10 mg by mouth daily.   QC Tumeric Complex 500 MG Caps Generic drug: Turmeric Take 500 mg by mouth daily.         Objective:   BP 129/76   Pulse 76   Ht 5' 2 (1.575 m)   Wt 140 lb (63.5 kg)   SpO2 98%   BMI 25.61 kg/m   Wt Readings from Last 3 Encounters:  09/19/24 140 lb (63.5 kg)  06/20/24 141 lb (64 kg)  06/15/24 141 lb (64 kg)    Physical Exam Physical Exam   VITALS: BP- 129/76 CHEST: Lungs clear to auscultation bilaterally. CARDIOVASCULAR: Heart regular rate and rhythm. EXTREMITIES: No swelling in extremities. Good pulses in both legs.         Assessment & Plan:   Problem List Items Addressed This Visit       Cardiovascular and Mediastinum   Hypertension - Primary   Relevant Orders   CMP14+EGFR   Lipid panel     Endocrine   Hypothyroid   Relevant Orders   TSH   Iodine, Serum/Plasma     Other   HLD (hyperlipidemia)   Relevant Orders   Lipid panel   Prediabetes   Relevant Orders   CMP14+EGFR   Lipid panel   Bayer DCA Hb A1c Waived       Acquired hypothyroidism Managed with thyroid  medication. Vitamin D  levels not excessive. Iodine deficiency unlikely due to iodized salt use in the [place] . - Rechecked thyroid  function today. - Continue current thyroid  medication regimen. - Consider iodine testing if clinically indicated.  Primary hypertension Blood pressure  well-controlled at 129/76 mmHg. - Continue current antihypertensive regimen.  Mixed hyperlipidemia Managed with cholesterol medication and fish oil. - Continue current cholesterol medication and fish oil regimen.          Follow up plan: Return in about 3 months (around 12/18/2024), or if symptoms worsen or fail to improve, for Prediabetes and hypertension and hyperlipidemia.  Counseling provided for all of the vaccine components Orders Placed This Encounter  Procedures   CMP14+EGFR   Lipid panel   TSH   Bayer DCA Hb A1c Waived   Iodine, Serum/Plasma    Felicia Both, MD [facility] 09/19/2024, 2:11 PM

## 2024-09-19 NOTE — Telephone Encounter (Signed)
 Pt wants to see Mliss to go over her medications.

## 2024-09-20 ENCOUNTER — Other Ambulatory Visit (HOSPITAL_COMMUNITY): Payer: Self-pay

## 2024-09-20 NOTE — Telephone Encounter (Signed)
 Contacted patient. Notified patient. Patient verbalized understanding.

## 2024-09-20 NOTE — Telephone Encounter (Signed)
 Sent referral

## 2024-09-21 LAB — IODINE, SERUM/PLASMA: Iodine: 86.9 ug/L — ABNORMAL HIGH (ref 33.5–71.9)

## 2024-09-21 LAB — CMP14+EGFR
ALT: 15 IU/L (ref 0–32)
AST: 23 IU/L (ref 0–40)
Albumin: 4.6 g/dL (ref 3.8–4.8)
Alkaline Phosphatase: 47 IU/L — ABNORMAL LOW (ref 49–135)
BUN/Creatinine Ratio: 18 (ref 12–28)
BUN: 15 mg/dL (ref 8–27)
Bilirubin Total: 0.2 mg/dL (ref 0.0–1.2)
CO2: 25 mmol/L (ref 20–29)
Calcium: 10.4 mg/dL — ABNORMAL HIGH (ref 8.7–10.3)
Chloride: 101 mmol/L (ref 96–106)
Creatinine, Ser: 0.85 mg/dL (ref 0.57–1.00)
Globulin, Total: 2 g/dL (ref 1.5–4.5)
Glucose: 89 mg/dL (ref 70–99)
Potassium: 4.3 mmol/L (ref 3.5–5.2)
Sodium: 143 mmol/L (ref 134–144)
Total Protein: 6.6 g/dL (ref 6.0–8.5)
eGFR: 71 mL/min/1.73 (ref >59)

## 2024-09-21 LAB — LIPID PANEL
Chol/HDL Ratio: 3.2 ratio (ref 0.0–4.4)
Cholesterol, Total: 230 mg/dL — ABNORMAL HIGH (ref 100–199)
HDL: 73 mg/dL (ref >39)
LDL Chol Calc (NIH): 125 mg/dL — ABNORMAL HIGH (ref 0–99)
Triglycerides: 185 mg/dL — ABNORMAL HIGH (ref 0–149)
VLDL Cholesterol Cal: 32 mg/dL (ref 5–40)

## 2024-09-21 LAB — TSH: TSH: 2.76 u[IU]/mL (ref 0.450–4.500)

## 2024-09-24 ENCOUNTER — Other Ambulatory Visit: Payer: Self-pay

## 2024-09-24 ENCOUNTER — Encounter: Payer: Self-pay | Admitting: Family Medicine

## 2024-09-24 ENCOUNTER — Other Ambulatory Visit (HOSPITAL_COMMUNITY): Payer: Self-pay

## 2024-09-24 ENCOUNTER — Ambulatory Visit: Payer: Self-pay | Admitting: Family Medicine

## 2024-09-24 MED ORDER — EZETIMIBE 10 MG PO TABS: 10.0000 mg | ORAL_TABLET | Freq: Every evening | ORAL | 3 refills | Status: AC

## 2024-09-27 ENCOUNTER — Telehealth: Payer: Self-pay | Admitting: *Deleted

## 2024-09-27 NOTE — Progress Notes (Signed)
 Care Guide Pharmacy Note  09/27/2024 Name: [name] MRN: [id number] DOB: [birth year]  Referred By: Maryanne Fonda LABOR, MD Reason for referral: Call Attempt #1 and Complex Care Management (Outreach to schedule referral with pharmacist )   [name] is a 78 y.o. year old female who is a primary care patient of Dettinger, Fonda LABOR, MD.  Kollyns G North was referred to the pharmacist for assistance related to: DMII  Successful contact was made with the patient to discuss pharmacy services including being ready for the pharmacist to call at least 5 minutes before the scheduled appointment time and to have medication bottles and any blood pressure readings ready for review. The patient agreed to meet with the pharmacist via telephone visit on 10/19/2023  Thedford Franks, CMA [place]  [facility] Guide Direct Dial: [phone]  Fax: [phone] Website: [email or web address]

## 2024-10-01 ENCOUNTER — Other Ambulatory Visit: Payer: Self-pay

## 2024-10-01 ENCOUNTER — Telehealth: Payer: Self-pay | Admitting: Family Medicine

## 2024-10-01 DIAGNOSIS — M8000XS Age-related osteoporosis with current pathological fracture, unspecified site, sequela: Secondary | ICD-10-CM

## 2024-10-01 MED ORDER — JUBBONTI 60 MG/ML ~~LOC~~ SOSY: 60.0000 mg | PREFILLED_SYRINGE | Freq: Once | SUBCUTANEOUS | 0 refills | Status: AC

## 2024-10-01 NOTE — Telephone Encounter (Signed)
 Copied from CRM #[id number]. Topic: Clinical - Medication Question >> Oct 01, 2024  8:59 AM Cherylann RAMAN wrote: Reason for CRM: Patient called to verify Prolia  injection has been received in office. Please contact patient at [phone]

## 2024-10-01 NOTE — Telephone Encounter (Signed)
 Had to resend in for Jubbonti  - will contact pharmacy tomorrow regarding medication to verify that they can send out in time for appt

## 2024-10-05 ENCOUNTER — Telehealth: Payer: Self-pay

## 2024-10-05 NOTE — Telephone Encounter (Signed)
 Copied from CRM [id number]. Topic: Clinical - Medication Question >> Oct 05, 2024  9:10 AM [name] wrote: Reason for CRM: pt was calling to make sure that her prolia  injection is in before she comes in for injection on Monday!

## 2024-10-08 ENCOUNTER — Other Ambulatory Visit: Payer: Self-pay

## 2024-10-08 ENCOUNTER — Ambulatory Visit

## 2024-10-08 MED ORDER — JUBBONTI 60 MG/ML ~~LOC~~ SOSY: 60.0000 mg | PREFILLED_SYRINGE | SUBCUTANEOUS | 0 refills | Status: DC

## 2024-10-08 NOTE — Telephone Encounter (Signed)
 Spoke to pharmacy and resent RX - they are to overnight it as soon as they can contact patient - patient is aware and we will cb to rescheduled once injection arrives

## 2024-10-16 ENCOUNTER — Telehealth: Payer: Self-pay | Admitting: Family Medicine

## 2024-10-16 ENCOUNTER — Encounter: Payer: Self-pay | Admitting: Physician Assistant

## 2024-10-16 ENCOUNTER — Ambulatory Visit: Payer: Self-pay | Admitting: Physician Assistant

## 2024-10-16 ENCOUNTER — Other Ambulatory Visit (INDEPENDENT_AMBULATORY_CARE_PROVIDER_SITE_OTHER)

## 2024-10-16 ENCOUNTER — Ambulatory Visit: Admitting: Physician Assistant

## 2024-10-16 VITALS — BP 122/68 | HR 74 | Ht 61.0 in | Wt 142.1 lb

## 2024-10-16 DIAGNOSIS — R1310 Dysphagia, unspecified: Secondary | ICD-10-CM

## 2024-10-16 DIAGNOSIS — K588 Other irritable bowel syndrome: Secondary | ICD-10-CM | POA: Diagnosis not present

## 2024-10-16 DIAGNOSIS — R1084 Generalized abdominal pain: Secondary | ICD-10-CM | POA: Diagnosis not present

## 2024-10-16 DIAGNOSIS — R194 Change in bowel habit: Secondary | ICD-10-CM | POA: Diagnosis not present

## 2024-10-16 DIAGNOSIS — R1013 Epigastric pain: Secondary | ICD-10-CM | POA: Diagnosis not present

## 2024-10-16 DIAGNOSIS — K219 Gastro-esophageal reflux disease without esophagitis: Secondary | ICD-10-CM | POA: Diagnosis not present

## 2024-10-16 DIAGNOSIS — Z800 Family history of malignant neoplasm of digestive organs: Secondary | ICD-10-CM

## 2024-10-16 LAB — COMPREHENSIVE METABOLIC PANEL WITH GFR
ALT: 14 U/L (ref 3–35)
AST: 16 U/L (ref 5–37)
Albumin: 4.4 g/dL (ref 3.5–5.2)
Alkaline Phosphatase: 36 U/L — ABNORMAL LOW (ref 39–117)
BUN: 18 mg/dL (ref 6–23)
CO2: 32 meq/L (ref 19–32)
Calcium: 9.7 mg/dL (ref 8.4–10.5)
Chloride: 104 meq/L (ref 96–112)
Creatinine, Ser: 0.94 mg/dL (ref 0.40–1.20)
GFR: 58.54 mL/min — ABNORMAL LOW
Glucose, Bld: 79 mg/dL (ref 70–99)
Potassium: 4.5 meq/L (ref 3.5–5.1)
Sodium: 140 meq/L (ref 135–145)
Total Bilirubin: 0.3 mg/dL (ref 0.2–1.2)
Total Protein: 6.4 g/dL (ref 6.0–8.3)

## 2024-10-16 LAB — CBC WITH DIFFERENTIAL/PLATELET
Basophils Absolute: 0.1 K/uL (ref 0.0–0.1)
Basophils Relative: 0.8 % (ref 0.0–3.0)
Eosinophils Absolute: 0.1 K/uL (ref 0.0–0.7)
Eosinophils Relative: 1.8 % (ref 0.0–5.0)
HCT: 44.4 % (ref 36.0–46.0)
Hemoglobin: 14.8 g/dL (ref 12.0–15.0)
Lymphocytes Relative: 26.2 % (ref 12.0–46.0)
Lymphs Abs: 2 K/uL (ref 0.7–4.0)
MCHC: 33.4 g/dL (ref 30.0–36.0)
MCV: 88.9 fl (ref 78.0–100.0)
Monocytes Absolute: 0.7 K/uL (ref 0.1–1.0)
Monocytes Relative: 8.8 % (ref 3.0–12.0)
Neutro Abs: 4.7 K/uL (ref 1.4–7.7)
Neutrophils Relative %: 62.4 % (ref 43.0–77.0)
Platelets: 232 K/uL (ref 150.0–400.0)
RBC: 5 Mil/uL (ref 3.87–5.11)
RDW: 13.5 % (ref 11.5–15.5)
WBC: 7.6 K/uL (ref 4.0–10.5)

## 2024-10-16 LAB — LIPASE: Lipase: 40 U/L (ref 11.0–59.0)

## 2024-10-16 MED ORDER — HYOSCYAMINE SULFATE ER 0.375 MG PO TB12: 0.3750 mg | ORAL_TABLET | Freq: Two times a day (BID) | ORAL | 2 refills | Status: AC

## 2024-10-16 MED ORDER — HYOSCYAMINE SULFATE 0.125 MG SL SUBL: 0.1250 mg | SUBLINGUAL_TABLET | Freq: Four times a day (QID) | SUBLINGUAL | 3 refills | Status: AC | PRN

## 2024-10-16 MED ORDER — FAMOTIDINE 20 MG PO TABS: 20.0000 mg | ORAL_TABLET | Freq: Two times a day (BID) | ORAL | 5 refills | Status: AC

## 2024-10-16 NOTE — Telephone Encounter (Signed)
 Copied from CRM [id number]. Topic: General - Other >> Oct 16, 2024  2:51 PM Jasmin G wrote: Reason for CRM: Pt requested a call back at [phone] from Ms. Melissa to order a shot needed.
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
## 2024-10-16 NOTE — Telephone Encounter (Signed)
 Patient called states that PA is needed for Prolia  - informed patient that PA has been done and Jubbonti  is the preferred medication through her insurance, they will not approve Prolia  at this time. Patient would like to cont Prolia  and will do the buy and bill through the office. Patient is aware of cost. Appt scheduled for tomorrow 10/17/2024

## 2024-10-16 NOTE — Telephone Encounter (Signed)
 Patient wants to stay with Prolia  and will do buy/bill through the office. Patient aware of cost

## 2024-10-16 NOTE — Patient Instructions (Addendum)
 We have sent the following medications to your pharmacy for you to pick up at your convenience: Famotidine , Hyoscyamine     You have been scheduled for a CT scan of the abdomen and [facility]:  You are scheduled on 10/24/24 at 3:30 pm. You should arrive at 1:15 pm to begin drinking oral contrast and to register. Please follow the written instructions below on the day of your exam:   1) Do not eat anything after 11:30 am (4 hours prior to your test)    You may take any medications as prescribed with a small amount of water, if necessary. If you take any of the following medications: METFORMIN, GLUCOPHAGE, GLUCOVANCE, AVANDAMET, RIOMET, FORTAMET, ACTOPLUS MET, JANUMET, GLUMETZA or METAGLIP, you MAY be asked to HOLD this medication 48 hours AFTER the exam.   The purpose of you drinking the oral contrast is to aid in the visualization of your intestinal tract. The contrast solution may cause some diarrhea. Depending on your individual set of symptoms, you may also receive an intravenous injection of x-ray contrast/dye. Plan on being at [facility] for 45 minutes or longer, depending on the type of exam you are having performed.   If you have any questions regarding your exam or if you need to reschedule, you may call [facility] at [phone] between the hours of 8:00 am and 5:00 pm, Monday-Friday.   Your provider has requested that you go to the basement level for lab work before leaving today. Press B on the elevator. The lab is located at the first door on the left as you exit the elevator.  Due to recent changes in healthcare laws, you may see the results of your imaging and laboratory studies on MyChart before your provider has had a chance to review them.  We understand that in some cases there may be results that are confusing or concerning to you. Not all laboratory results come back in the same time frame and the provider may be waiting for multiple results in order to  interpret others.  Please give us  48 hours in order for your provider to thoroughly review all the results before contacting the office for clarification of your results.   _______________________________________________________  If your blood pressure at your visit was 140/90 or greater, please contact your primary care physician to follow up on this.  _______________________________________________________  If you are age 64 or older, your body mass index should be between 23-30. Your Body mass index is 26.85 kg/m. If this is out of the aforementioned range listed, please consider follow up with your Primary Care Provider.  If you are age 83 or younger, your body mass index should be between 19-25. Your Body mass index is 26.85 kg/m. If this is out of the aformentioned range listed, please consider follow up with your Primary Care Provider.   ________________________________________________________  The [facility] providers would like to encourage you to use MYCHART to communicate with providers for non-urgent requests or questions.  Due to long hold times on the telephone, sending your provider a message by [facility] may be a faster and more efficient way to get a response.  Please allow 48 business hours for a response.  Please remember that this is for non-urgent requests.  _______________________________________________________  [facility] is using a team-based approach to care.  Your team is made up of your doctor and two to three APPS. Our APPS (Nurse Practitioners and Physician Assistants) work with your physician to ensure care  continuity for you. They are fully qualified to address your health concerns and develop a treatment plan. They communicate directly with your gastroenterologist to care for you. Seeing the Advanced Practice Practitioners on your physician's team can help you by facilitating care more promptly, often allowing for earlier appointments, access to  diagnostic testing, procedures, and other specialty referrals.   Thank you for choosing me and [facility].  Delon Failing, PA-C

## 2024-10-17 ENCOUNTER — Ambulatory Visit (INDEPENDENT_AMBULATORY_CARE_PROVIDER_SITE_OTHER): Admitting: *Deleted

## 2024-10-17 DIAGNOSIS — M8000XS Age-related osteoporosis with current pathological fracture, unspecified site, sequela: Secondary | ICD-10-CM

## 2024-10-17 MED ORDER — DENOSUMAB 60 MG/ML ~~LOC~~ SOSY
60.0000 mg | PREFILLED_SYRINGE | SUBCUTANEOUS | Status: AC
  Administered 2024-10-17: 60 mg via SUBCUTANEOUS

## 2024-10-17 NOTE — Progress Notes (Signed)
 Patient is in office today for a nurse visit for Prolia  injection, patient supplied Patient Injection was given in the  Left arm. Patient tolerated injection well.

## 2024-10-18 ENCOUNTER — Other Ambulatory Visit

## 2024-10-18 DIAGNOSIS — M818 Other osteoporosis without current pathological fracture: Secondary | ICD-10-CM

## 2024-10-18 MED ORDER — MAGNESIUM 400 MG PO TABS: 400.0000 mg | ORAL_TABLET | Freq: Every day | ORAL | Status: AC

## 2024-10-18 NOTE — Progress Notes (Unsigned)
 "  10/18/2024 Name: [name] MRN: [id number] DOB: 1946/09/24  Chief Complaint  Patient presents with   Osteoporosis    [name] is a 79 y.o. year old female who presented for a telephone visit.  I connected with  [name] on 10/18/2024 by telephone and verified that I am speaking with the correct person using two identifiers. I discussed the limitations of evaluation and management by telemedicine. The patient expressed understanding and agreed to proceed.  Patient was located in her home and PharmD in PCP office during this visit.  They were referred to the pharmacist by their PCP for assistance in managing osteoporosis .    Subjective:  Patient reports ongoing stomach/GI issues and is currently seeing GI.  They recently due to ongoing GI is Recent increase in famotidine  OTC to twice daily Due for DEXA 07/2025 Taking caltrate once daily Cannot tolerate twice daily due to GI issues Prolia  since 2023; did not want to switch to Jubbonti   Care Team: Primary Care Provider: Dettinger, Fonda LABOR, MD   Medication Access/Adherence  Current Pharmacy:  [facility] [address] [place] - [address] [place] [address] Phone: [phone] Fax: [phone]  [facility] - [place], [place] - [address] [place] [address] Phone: [phone] Fax: [phone]  [facility] [place], [place] - [address] [place] [address] Phone: [phone] Fax: [phone]   Patient reports affordability concerns with their medications: No  Patient reports access/transportation concerns to their pharmacy: No  Patient reports adherence concerns with their medications:  No     Osteoporosis:  Current medications: Prolia  since 2023 Medications tried in the past: HIVES to Forteo (teriparatide  Current supplements:   Current physical activity:  ***  Most recent DEXA: ***  Current medication access support: ***   Objective:  Lab Results  Component Value Date   HGBA1C 5.5 09/19/2024    Lab Results  Component Value Date   CREATININE 0.94 10/16/2024   BUN 18 10/16/2024   NA 140 10/16/2024   K 4.5 10/16/2024   CL 104 10/16/2024   CO2 32 10/16/2024    Lab Results  Component Value Date   CHOL 230 (H) 09/19/2024   HDL 73 09/19/2024   LDLCALC 125 (H) 09/19/2024   LDLDIRECT 155.9 12/07/2012   TRIG 185 (H) 09/19/2024   CHOLHDL 3.2 09/19/2024    Medications Reviewed Today     Reviewed by Billee Mliss BIRCH, RPH-CPP (Pharmacist) on 10/18/24 at 1321  Med List Status: <None>   Medication Order Taking? Sig Documenting Provider Last Dose Status Informant  acetaminophen  (TYLENOL ) 650 MG CR tablet [id number] Yes Take 650 mg by mouth every 8 (eight) hours as needed for pain. [provider]  Active    Patient not taking:   Discontinued 10/18/24 1316 (Patient Preference)   Calcium  Carb-Cholecalciferol  (CALCIUM  1000 + D) 1000-800 MG-UNIT TABS [id number] Yes Take 600 mg by mouth daily. [provider]  Active Self   Patient not taking:   Discontinued 10/18/24 1321 (Change in therapy) Coenzyme Q10 200 MG capsule [id number] Yes Take 200 mg by mouth daily.  Patient taking differently: Take 100 mg by mouth daily.   [provider]  Active Self  denosumab  (PROLIA ) injection 60 mg [id number]   Dettinger, Fonda LABOR, MD  Active     Discontinued 10/18/24 1316 (  Change in therapy)   docusate sodium  (COLACE) 50 MG capsule [id number] Yes Take 50 mg by mouth 2 (two) times daily.  Patient taking differently: Take 50 mg by mouth as needed.   [provider]  Active   doxycycline  (VIBRA -TABS) 100 MG tablet [id number]  Take 1 tablet (100 mg total) by mouth 2 (two) times daily.  Patient not taking: Reported on 10/16/2024   Tara Elsie BROCKS, PA-C  Active   ezetimibe  (ZETIA ) 10 MG tablet [id number] Yes Take 1 tablet (10 mg  total) by mouth at bedtime. Dettinger, Fonda LABOR, MD  Active   famotidine  (PEPCID ) 20 MG tablet [id number]  Take 1 tablet (20 mg total) by mouth 2 (two) times daily. Beather Delon Gibson, [place]  Active   hyoscyamine  (LEVBID ) 0.375 MG 12 hr tablet [id number] Yes Take 1 tablet (0.375 mg total) by mouth 2 (two) times daily. Beather Delon Gibson, [place]  Active   hyoscyamine  (LEVSIN  SL) 0.125 MG SL tablet [id number] Yes Take 1 tablet (0.125 mg total) by mouth every 6 (six) hours as needed for cramping. Beather Delon Gibson, [place]  Active   levothyroxine  (SYNTHROID ) 25 MCG tablet [id number] Yes Take 1 tablet (25 mcg total) by mouth daily. Dettinger, Fonda LABOR, MD  Active   Magnesium  400 MG TABS [id number] Yes Take 400 mg by mouth daily. Billee Mliss BIRCH, RPH-CPP  Active    Discontinued 10/18/24 1319 (Change in therapy) Multiple Vitamin (MULTIVITAMIN) capsule [id number] Yes Take 1 capsule by mouth daily. [provider]  Active Self  Omega-3 Fatty Acids (FISH OIL) 1000 MG CAPS [id number] Yes Take 1,200 mg by mouth in the morning and at bedtime.  [provider]  Active Self           Med Note (SATTERFIELD, TEENA BRAVO   Fri Jul 30, 2022  9:45 PM)    polyethylene glycol powder (GLYCOLAX /MIRALAX ) 17 GM/SCOOP powder [id number] Yes Take 17 g by mouth as needed. Dissolve 1 capful (17g) in 4-8 ounces of liquid and take by mouth daily. [provider]  Active   pravastatin  (PRAVACHOL ) 40 MG tablet [id number] Yes Take 1 tablet (40 mg total) by mouth at bedtime. Dettinger, Fonda LABOR, MD  Active   Tetrahydrozoline-Zn Sulfate (EYE DROPS ALLERGY RELIEF OP) [id number] Yes Apply 1 drop to eye daily as needed. [provider]  Active   Triamcinolone  Acetonide (NASACORT  ALLERGY 24HR NA) [id number] Yes Place 1 spray into the nose daily. [provider]  Active Self  Turmeric (QC TUMERIC COMPLEX) 500 MG CAPS [id number] Yes Take 500 mg by mouth daily.  Patient taking differently: Take 1,000 mg by mouth  daily.   [provider]  Active               Assessment/Plan:   Osteoporosis: - Currently appropriately managed - Reviewed recommendation for daily calcium  intake of 1200 mg and vitamin D  intake of [phone] units - Recommended to choose calcium  citrate formulation due to concurrent acid reflux medication - Reviewed benefits of weight bearing exercise - Recommend to ***    Follow Up Plan: ***  ***  "

## 2024-10-19 NOTE — Progress Notes (Signed)
 ____________________________________________________________  Attending physician addendum:  Thank you for sending this case to me. I have reviewed the entire note and agree with the plan.  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  Also agree that she does not need future screening/surveillance colonoscopy based on her history and current guidelines.  Glad she is having a cardiology evaluation soon  Victory Brand, MD  ____________________________________________________________

## 2024-10-23 ENCOUNTER — Encounter (HOSPITAL_BASED_OUTPATIENT_CLINIC_OR_DEPARTMENT_OTHER): Payer: Self-pay

## 2024-10-24 ENCOUNTER — Ambulatory Visit (HOSPITAL_BASED_OUTPATIENT_CLINIC_OR_DEPARTMENT_OTHER)
Admission: RE | Admit: 2024-10-24 | Discharge: 2024-10-24 | Disposition: A | Discharge status: Home / Self Care | Source: Ambulatory Visit | Attending: Physician Assistant | Admitting: Physician Assistant

## 2024-10-24 DIAGNOSIS — K219 Gastro-esophageal reflux disease without esophagitis: Secondary | ICD-10-CM | POA: Insufficient documentation

## 2024-10-24 DIAGNOSIS — K588 Other irritable bowel syndrome: Secondary | ICD-10-CM | POA: Diagnosis present

## 2024-10-24 DIAGNOSIS — R1084 Generalized abdominal pain: Secondary | ICD-10-CM | POA: Diagnosis present

## 2024-10-24 DIAGNOSIS — R1310 Dysphagia, unspecified: Secondary | ICD-10-CM | POA: Insufficient documentation

## 2024-10-24 MED ORDER — IOHEXOL 300 MG/ML  SOLN
100.0000 mL | Freq: Once | INTRAMUSCULAR | Status: AC | PRN
  Administered 2024-10-24: 100 mL via INTRAVENOUS

## 2024-11-13 ENCOUNTER — Other Ambulatory Visit (HOSPITAL_COMMUNITY): Payer: Self-pay

## 2024-12-24 ENCOUNTER — Ambulatory Visit: Admitting: Family Medicine

## 2025-06-19 ENCOUNTER — Ambulatory Visit: Payer: Self-pay
